# Patient Record
Sex: Male | Born: 1935
Health system: Southern US, Community
[De-identification: ages and names within clinical notes are randomized; demographics above are authoritative.]

## PROBLEM LIST (undated history)

## (undated) DIAGNOSIS — J069 Acute upper respiratory infection, unspecified: Secondary | ICD-10-CM

## (undated) DIAGNOSIS — K219 Gastro-esophageal reflux disease without esophagitis: Secondary | ICD-10-CM

## (undated) DIAGNOSIS — C801 Malignant (primary) neoplasm, unspecified: Secondary | ICD-10-CM

## (undated) DIAGNOSIS — J189 Pneumonia, unspecified organism: Secondary | ICD-10-CM

## (undated) DIAGNOSIS — R0602 Shortness of breath: Secondary | ICD-10-CM

## (undated) DIAGNOSIS — N182 Chronic kidney disease, stage 2 (mild): Secondary | ICD-10-CM

## (undated) DIAGNOSIS — I10 Essential (primary) hypertension: Secondary | ICD-10-CM

## (undated) DIAGNOSIS — Z8719 Personal history of other diseases of the digestive system: Secondary | ICD-10-CM

## (undated) DIAGNOSIS — J449 Chronic obstructive pulmonary disease, unspecified: Secondary | ICD-10-CM

## (undated) DIAGNOSIS — K469 Unspecified abdominal hernia without obstruction or gangrene: Secondary | ICD-10-CM

## (undated) DIAGNOSIS — I4891 Unspecified atrial fibrillation: Secondary | ICD-10-CM

## (undated) HISTORY — PX: COLON SURGERY: SHX602

---

## 1998-02-09 ENCOUNTER — Encounter: Payer: Self-pay | Admitting: Emergency Medicine

## 1998-02-09 ENCOUNTER — Encounter: Payer: Self-pay | Admitting: Cardiology

## 1998-02-10 ENCOUNTER — Inpatient Hospital Stay (HOSPITAL_COMMUNITY): Admission: EM | Admit: 1998-02-10 | Discharge: 1998-02-11 | Payer: Self-pay | Admitting: Emergency Medicine

## 1998-02-10 ENCOUNTER — Encounter: Payer: Self-pay | Admitting: Cardiology

## 1998-07-23 ENCOUNTER — Inpatient Hospital Stay (HOSPITAL_COMMUNITY): Admission: RE | Admit: 1998-07-23 | Discharge: 1998-07-30 | Payer: Self-pay | Admitting: Surgery

## 1998-07-23 ENCOUNTER — Encounter: Payer: Self-pay | Admitting: Surgery

## 1999-12-23 ENCOUNTER — Encounter: Admission: RE | Admit: 1999-12-23 | Discharge: 1999-12-23 | Payer: Self-pay | Admitting: *Deleted

## 2000-01-26 ENCOUNTER — Ambulatory Visit (HOSPITAL_COMMUNITY): Admission: RE | Admit: 2000-01-26 | Discharge: 2000-01-26 | Payer: Self-pay | Admitting: Internal Medicine

## 2000-01-28 ENCOUNTER — Ambulatory Visit (HOSPITAL_COMMUNITY): Admission: RE | Admit: 2000-01-28 | Discharge: 2000-01-28 | Payer: Self-pay | Admitting: *Deleted

## 2000-01-28 ENCOUNTER — Encounter (INDEPENDENT_AMBULATORY_CARE_PROVIDER_SITE_OTHER): Payer: Self-pay | Admitting: Specialist

## 2000-08-31 ENCOUNTER — Encounter (HOSPITAL_COMMUNITY): Admission: RE | Admit: 2000-08-31 | Discharge: 2000-11-29 | Payer: Self-pay | Admitting: Internal Medicine

## 2000-11-30 ENCOUNTER — Encounter (HOSPITAL_COMMUNITY): Admission: RE | Admit: 2000-11-30 | Discharge: 2001-02-28 | Payer: Self-pay | Admitting: Internal Medicine

## 2004-05-23 ENCOUNTER — Encounter: Payer: Self-pay | Admitting: Internal Medicine

## 2004-08-14 ENCOUNTER — Ambulatory Visit: Payer: Self-pay

## 2008-05-15 ENCOUNTER — Ambulatory Visit: Payer: Self-pay

## 2008-05-15 ENCOUNTER — Encounter (INDEPENDENT_AMBULATORY_CARE_PROVIDER_SITE_OTHER): Payer: Self-pay | Admitting: Internal Medicine

## 2008-05-18 ENCOUNTER — Emergency Department (HOSPITAL_COMMUNITY): Admission: EM | Admit: 2008-05-18 | Discharge: 2008-05-18 | Payer: Self-pay | Admitting: Emergency Medicine

## 2008-06-30 ENCOUNTER — Emergency Department (HOSPITAL_COMMUNITY): Admission: EM | Admit: 2008-06-30 | Discharge: 2008-06-30 | Payer: Self-pay | Admitting: Family Medicine

## 2008-11-22 ENCOUNTER — Encounter: Payer: Self-pay | Admitting: Internal Medicine

## 2009-01-28 ENCOUNTER — Encounter: Admission: RE | Admit: 2009-01-28 | Discharge: 2009-01-28 | Payer: Self-pay | Admitting: General Surgery

## 2009-01-28 ENCOUNTER — Encounter: Payer: Self-pay | Admitting: Internal Medicine

## 2009-02-13 ENCOUNTER — Ambulatory Visit: Payer: Self-pay | Admitting: Internal Medicine

## 2009-02-13 DIAGNOSIS — K469 Unspecified abdominal hernia without obstruction or gangrene: Secondary | ICD-10-CM | POA: Insufficient documentation

## 2009-02-13 DIAGNOSIS — I10 Essential (primary) hypertension: Secondary | ICD-10-CM

## 2009-02-13 DIAGNOSIS — E785 Hyperlipidemia, unspecified: Secondary | ICD-10-CM | POA: Insufficient documentation

## 2009-02-13 DIAGNOSIS — J449 Chronic obstructive pulmonary disease, unspecified: Secondary | ICD-10-CM | POA: Insufficient documentation

## 2009-03-15 ENCOUNTER — Ambulatory Visit: Payer: Self-pay | Admitting: Internal Medicine

## 2009-03-21 ENCOUNTER — Encounter: Payer: Self-pay | Admitting: Internal Medicine

## 2009-04-12 ENCOUNTER — Ambulatory Visit: Payer: Self-pay | Admitting: Internal Medicine

## 2009-06-11 ENCOUNTER — Emergency Department (HOSPITAL_COMMUNITY)
Admission: EM | Admit: 2009-06-11 | Discharge: 2009-06-12 | Payer: Self-pay | Source: Home / Self Care | Admitting: Emergency Medicine

## 2010-02-14 ENCOUNTER — Encounter: Payer: Self-pay | Admitting: Internal Medicine

## 2010-03-17 ENCOUNTER — Telehealth (INDEPENDENT_AMBULATORY_CARE_PROVIDER_SITE_OTHER): Payer: Self-pay | Admitting: *Deleted

## 2010-03-28 ENCOUNTER — Ambulatory Visit
Admission: RE | Admit: 2010-03-28 | Discharge: 2010-03-28 | Payer: Self-pay | Source: Home / Self Care | Attending: Internal Medicine | Admitting: Internal Medicine

## 2010-03-28 DIAGNOSIS — C61 Malignant neoplasm of prostate: Secondary | ICD-10-CM | POA: Insufficient documentation

## 2010-03-28 DIAGNOSIS — R05 Cough: Secondary | ICD-10-CM | POA: Insufficient documentation

## 2010-04-04 ENCOUNTER — Ambulatory Visit
Admission: RE | Admit: 2010-04-04 | Discharge: 2010-04-08 | Payer: Self-pay | Source: Home / Self Care | Attending: Radiation Oncology | Admitting: Radiation Oncology

## 2010-04-06 ENCOUNTER — Emergency Department (HOSPITAL_COMMUNITY)
Admission: EM | Admit: 2010-04-06 | Discharge: 2010-04-06 | Payer: Self-pay | Source: Home / Self Care | Admitting: Emergency Medicine

## 2010-04-06 LAB — DIFFERENTIAL
Basophils Relative: 0 % (ref 0–1)
Eosinophils Relative: 2 % (ref 0–5)
Lymphocytes Relative: 15 % (ref 12–46)
Monocytes Absolute: 1.4 10*3/uL — ABNORMAL HIGH (ref 0.1–1.0)
Monocytes Relative: 15 % — ABNORMAL HIGH (ref 3–12)
Neutro Abs: 6.1 10*3/uL (ref 1.7–7.7)

## 2010-04-06 LAB — BASIC METABOLIC PANEL
CO2: 26 mEq/L (ref 19–32)
Calcium: 9 mg/dL (ref 8.4–10.5)
Creatinine, Ser: 1.2 mg/dL (ref 0.4–1.5)
Glucose, Bld: 95 mg/dL (ref 70–99)

## 2010-04-06 LAB — CBC
HCT: 41.2 % (ref 39.0–52.0)
Hemoglobin: 13.9 g/dL (ref 13.0–17.0)
MCH: 31.3 pg (ref 26.0–34.0)
MCHC: 33.7 g/dL (ref 30.0–36.0)

## 2010-04-08 NOTE — Assessment & Plan Note (Signed)
Summary: Pulmonary/ summary final f/u ov with hfa teaching   Copy to:  Dr. Autumn Messing Primary Provider/Referring Provider:  Dr. Ashby Dawes  CC:  1 month followup.  Pt denies any complaints today.  He states that he thinks brovana has helped his breathing some.Marland Kitchen  History of Present Illness: 53 yowm quit smoking around 1970 due to Vocal cord problems per ENT but no residual symptoms  February 13, 2009 cc variable  sob x 10 years with PFT's at [facility] but doesn't remember when  gradually worse to point where needs when exert needs to go slow and wear 02.  Also cough more daytime clear mucus with occasional sensation of throat closing up, mucus getting stuck at level of suprasternal notch and choking him.  rec stop benazepril start benicar 40 mg one daily Stop fish oil until no longer coughing, then ok to take it again  March 15, 2009 1 month followup with PFT's.  Pt states that breathing is the same- no better or worse. cough is gone.  GOLD III rec start neb brovana /budoside   April 12, 2009 1 month followup.  Pt denies any complaints today.  He states that he thinks brovana /budesonide has helped his breathing some. Trouble paying for and naming his meds. Pt denies any increase in rescue therapy over baseline, denies waking up needing it or having early am exacerbations of coughing/wheezing/ or dyspnea   Current Medications (verified): 1)  Hydrochlorothiazide 12.5 Mg Caps (Hydrochlorothiazide) .Marland Kitchen.. 1 Once Daily 2)  Doxazosin Mesylate 2 Mg Tabs (Doxazosin Mesylate) .Marland Kitchen.. 1 Once Daily 3)  Lipitor 80 Mg Tabs (Atorvastatin Calcium) .... 1/2 Every Other Day 4)  Centrum Silver  Tabs (Multiple Vitamins-Minerals) .Marland Kitchen.. 1 Once Daily 5)  Aspirin 81 Mg Tbec (Aspirin) .Marland Kitchen.. 1 Once Daily 6)  Oscal 500/200 D-3 500-200 Mg-Unit Tabs (Calcium-Vitamin D) .Marland Kitchen.. 1 Once Daily 7)  Ventolin Hfa 108 (90 Base) Mcg/act Aers (Albuterol Sulfate) .... 2 Puffs Every 4-6 Hours As Needed 8)  Albuterol Sulfate (2.5 Mg/64ml)  0.083% Nebu (Albuterol Sulfate) .Marland Kitchen.. 1 in Nebulizer Three Times A Day 9)  Budesonide 0.5 Mg/75ml Susp (Budesonide) .Marland Kitchen.. 1 Vial in Nebulizer Two Times A Day 10)  Oxygen 2 Liters .... As Needed 11)  Benicar 40 Mg  Tabs (Olmesartan Medoxomil) .... One Tablet By Mouth Daily 12)  Brovana 15 Mcg/47ml  Nebu (Arformoterol Tartrate) .... One in Nebulizer Twice Daily  Allergies (verified): No Known Drug Allergies  Past History:  Past Medical History: C O P D.......................................................Marland KitchenWert    - PFT's March 15, 2009 FEV1 1.05 (44%) ratio 39 with 14% better after B2 and DLC048% Hyperlipidemia Hypertension  Vital Signs:  Patient profile:   77 year old male Weight:      182 pounds O2 Sat:      94 % on Room air Temp:     97.8 degrees F oral Pulse rate:   78 / minute BP sitting:   142 / 80  (left arm)  Vitals Entered By: Tilden Dome (April 12, 2009 11:48 AM)  O2 Flow:  Room air  Physical Exam  Additional Exam:  wt 174 February 13, 2009 > 177 March 15, 2009 > 182 April 12, 2009  In general robust pleasant amb wf with mild voice fatgue and throat clearing pseudowheeze resolves with purse lip maneuver  HEENT mild turbinate edema.  Oropharynx no thrush or excess pnd or cobblestoning.  No JVD or cervical adenopathy. Mild accessory muscle  hypertrophy. Trachea midline, nl thryroid. Chest was hyperinflated by percussion with diminished breath sounds and moderate increased exp time without wheeze. Hoover sign positive at mid inspiration. Regular rate and rhythm without murmur gallop or rub or increase P2 or edema.  Abd: no hsm, nl excursion. Ext warm without cyanosis or clubbing.     Impression & Recommendations:  Problem # 1:  C O P D (ICD-496)  PFT's March 15, 2009 FEV1 1.05 (44%) ratio 39 with 14% better after B2 and DLC048%  I spent extra time with the patient today explaining optimal mdi  technique.  This improved from  50-75% but he says he preferes neb  because he can get it thru part B medicare and can't afford to pay for it any other way. ok to add atrovent next to neb if needed which isn't the case now.   Each maintenance medication was reviewed in detail including most importantly the difference between maintenance and as needed and under what circumstances the prns are to be used. contingencies discussed  Problem # 2:  HYPERTENSION (ICD-401.9)  The following medications were removed from the medication list:    Benicar 40 Mg Tabs (Olmesartan medoxomil) ..... One tablet by mouth daily His updated medication list for this problem includes:    Hydrochlorothiazide 12.5 Mg Caps (Hydrochlorothiazide) .Marland Kitchen... 1 once daily    Doxazosin Mesylate 2 Mg Tabs (Doxazosin mesylate) .Marland Kitchen... 1 once daily    Hyzaar 100-12.5 Mg Tabs (Losartan potassium-hctz) ..... One daily  Prefer he avoid ace here and the only generic arb now is cozaar so see if he can get it through his generic plan and f/u per primary svc  ACE inhibitors are problematic in  pts with airway complaints because  even experienced pulmononlogists can't always distinguish ace effects from copd/asthma.  By themselves they don't actually cause a problem, much like oxygen can't by itself start a fire, but they certainly serve as a powerful catalyst or enhancer for any "fire"  or inflammatory process in the upper airway, be it caused by an ET  tube or more commonly reflux (especially in the obese or pts with known GERD or who are on biphoshonates). In the era of ARB near equivalency until we have a better handle on the reversibility of the airway problem, it just makes sense to avoid ace entirely in the short run and then decide later, having established a level of airway control using a reasonable limited regimen, whether to add back ace but even then being very careful to observe the pt for worsening airway control and number of meds used/ needed to control symptoms.  In his case I would avoid them  entirely.  Orders: Est. Patient Level IV [id number])  Medications Added to Medication List This Visit: 1)  Hyzaar 100-12.5 Mg Tabs (Losartan potassium-hctz) .... One daily  Patient Instructions: 1)  Hyzaar/hct 100 mg/  generic one daily in place of benicar and if you're not safisfied see Dr Bernie Covey 2)  if doing ok with breathing continue the Brovan/budesonide twice daily and use albuterol 2.5  as needed 3)  return in one year or let Dr Bernie Covey refill your nebulizer meds 4)  ADD 5)  Consider adding atrovent per neb if not satified with Brovana and Budesonide, the equivalent of Symbicort, per neb.  6)  OK FOR MINOR ABD SURGERIES LIKE HERNIA REPAIRS BUT WOULD NEED REPEAT PREOP CLEARANCE FOR ANYTHING ELSE Prescriptions: HYZAAR 100-12.5 MG TABS (LOSARTAN POTASSIUM-HCTZ) one daily  #30 x 11  Entered and Authorized by:   Tanda Rockers MD   Signed by:   Tanda Rockers MD on 04/12/2009   Method used:   Electronically to        [facility] (retail)       [address]       [place], [place]  [address]       Ph: [phone]       Fax: [phone]   RxID:   [id number]

## 2010-04-08 NOTE — Miscellaneous (Signed)
Summary: Orders Update pft charges  Clinical Lists Changes  Orders: Added new Service order of Carbon Monoxide diffusing w/capacity (94720) - Signed Added new Service order of Lung Volumes (94240) - Signed Added new Service order of Spirometry (Pre & Post) (94060) - Signed 

## 2010-04-08 NOTE — Assessment & Plan Note (Signed)
Summary: Pulmonary/ f/u ov with hfa teaching/ start brovana   Copy to:  Dr. Autumn Messing Primary Provider/Referring Provider:  Dr. Ashby Dawes  CC:  1 month followup with PFT's.  Pt states that breathing is the same- no better or worse.  Marland Kitchen  History of Present Illness: 24 yowm quit smoking around 1970 due to Vocal cord problems per ENT but no residual symptoms  February 13, 2009 cc variable  sob x 10 years with PFT's at [facility] but doesn't remember when  gradually worse to point where needs when exert needs to go slow and wear 02.  Also cough more daytime clear mucus with occasional sensation of throat closing up, mucus getting stuck at level of suprasternal notch and choking him.  rec stop benazepril start benicar 40 mg one daily Stop fish oil until no longer coughing, then ok to take it again  March 15, 2009 1 month followup with PFT's.  Pt states that breathing is the same- no better or worse. cough is gone.  Pt denies any significant sore throat, dysphagia, itching, sneezing,  nasal congestion or excess secretions,  fever, chills, sweats, unintended wt loss, pleuritic or exertional cp, hempoptysis, change in activity tolerance  orthopnea pnd or leg swelling .  Pt also denies any obvious fluctuation in symptoms with weather or environmental change or other alleviating or aggravating factors.        Current Medications (verified): 1)  Hydrochlorothiazide 12.5 Mg Caps (Hydrochlorothiazide) .Marland Kitchen.. 1 Once Daily 2)  Doxazosin Mesylate 2 Mg Tabs (Doxazosin Mesylate) .Marland Kitchen.. 1 Once Daily 3)  Lipitor 80 Mg Tabs (Atorvastatin Calcium) .... 1/2 Every Other Day 4)  Centrum Silver  Tabs (Multiple Vitamins-Minerals) .Marland Kitchen.. 1 Once Daily 5)  Aspirin 81 Mg Tbec (Aspirin) .Marland Kitchen.. 1 Once Daily 6)  Oscal 500/200 D-3 500-200 Mg-Unit Tabs (Calcium-Vitamin D) .Marland Kitchen.. 1 Once Daily 7)  Ventolin Hfa 108 (90 Base) Mcg/act Aers (Albuterol Sulfate) .... 2 Puffs Every 4-6 Hours As Needed 8)  Albuterol Sulfate (2.5 Mg/69ml) 0.083%  Nebu (Albuterol Sulfate) .Marland Kitchen.. 1 in Nebulizer Three Times A Day 9)  Budesonide 0.5 Mg/43ml Susp (Budesonide) .Marland Kitchen.. 1 Vial in Nebulizer Two Times A Day 10)  Oxygen 2 Liters .... As Needed 11)  Benicar 40 Mg  Tabs (Olmesartan Medoxomil) .... One Tablet By Mouth Daily  Allergies (verified): No Known Drug Allergies  Past History:  Past Medical History: C O P D.....................................................Marland KitchenWert    - PFT's March 15, 2009 FEV1 1.05 (44%) ratio 39 with 14% better after B2 and DLC048% Hyperlipidemia Hypertension  Vital Signs:  Patient profile:   77 year old male Weight:      177 pounds O2 Sat:      94 % on Room air Temp:     97.3 degrees F oral Pulse rate:   88 / minute BP sitting:   112 / 76  (left arm)  Vitals Entered By: Tilden Dome (March 15, 2009 11:46 AM)  O2 Flow:  Room air  Physical Exam  Additional Exam:  wt 174 February 13, 2009 > 177 March 15, 2009  In general robust pleasant amb wf with mild voice fatgue and throat clearing pseudowheeze resolves with purse lip maneuver  HEENT mild turbinate edema.  Oropharynx no thrush or excess pnd or cobblestoning.  No JVD or cervical adenopathy. Mild accessory muscle hypertrophy. Trachea midline, nl thryroid. Chest was hyperinflated by percussion with diminished breath sounds and moderate increased exp time without wheeze. Hoover sign positive at  mid inspiration. Regular rate and rhythm without murmur gallop or rub or increase P2 or edema.  Abd: no hsm, nl excursion. Ext warm without cyanosis or clubbing.     Impression & Recommendations:  Problem # 1:  C O P D (ICD-496) GOLD III with sign reversible component.  I spent extra time with the patient today explaining optimal mdi  technique.  This improved from  10-25 % but no better so until/unless able to master MDI more effectively option : change to Brovana and Budesonide, the equivalent of Symbicort, per neb.    Each maintenance medication was reviewed  in detail including most importantly the difference between maintenance and as needed and under what circumstances the prns are to be used. See instructions for specific recommendations   Medications Added to Medication List This Visit: 1)  Brovana 15 Mcg/23ml Nebu (Arformoterol tartrate) .... One in nebulizer twice daily  Other Orders: Est. Patient Level III [id number]) HFA Instruction [id number])  CHF Assessment/Plan:      The patient's current weight is 177 pounds.  His previous weight was 174 pounds.    Patient Instructions: 1)  GERD (REFLUX)  is a common cause of respiratory symptoms. It commonly presents without heartburn and can be treated with medication, but also with lifestyle changes including avoidance of late meals, excessive alcohol, smoking cessation, and avoid fatty foods, chocolate, peppermint, colas, red wine, and acidic juices such as orange juice. NO MINT OR MENTHOL PRODUCTS SO NO COUGH DROPS  2)  USE SUGARLESS CANDY INSTEAD (jolley ranchers)  3)  NO OIL BASED VITAMINS  4)  Start Brovan twice daily with budesonide in your nebulizer and only use albuterol in the puffer or nebulizer form as needed, not automatically 5)  Try symbicort 2 puffs first thing  in am and 2 puffs again in pm about 12 hours later to see if you learn the technique an helps your breathing  6)  Please schedule a follow-up appointment in 1 month. Prescriptions: BROVANA 15 MCG/2ML  NEBU (ARFORMOTEROL TARTRATE) One in nebulizer twice daily  #60 x 11   Entered and Authorized by:   Tanda Rockers MD   Signed by:   Tanda Rockers MD on 03/15/2009   Method used:   Print then Give to Patient   RxID:   [id number]

## 2010-04-08 NOTE — Medication Information (Signed)
Summary: ALB;BUDE;BROVANA/[facility]  ALB;BUDE;BROVANA/[facility]   Imported By: Bubba Hales 03/25/2009 09:53:31  _____________________________________________________________________  External Attachment:    Type:   Image     Comment:   External Document

## 2010-04-09 ENCOUNTER — Ambulatory Visit: Payer: PRIVATE HEALTH INSURANCE | Admitting: Radiation Oncology

## 2010-04-10 NOTE — Assessment & Plan Note (Signed)
Summary: Pulmonary/ ext ov for copd   rx for cough   Copy to:  Dr. Autumn Messing Primary Provider/Referring Provider:  Dr. Ashby Dawes  CC:  Cough- worse.  History of Present Illness: 40  yowm quit smoking around 1970 due to Vocal cord problems per ENT but no residual symptoms  February 13, 2009 cc variable  sob x 10 years with PFT's at [facility] but doesn't remember when  gradually worse to point where needs when exert needs to go slow and wear 02.  Also cough more daytime clear mucus with occasional sensation of throat closing up, mucus getting stuck at level of suprasternal notch and choking him.  rec stop benazepril start benicar 40 mg one daily Stop fish oil until no longer coughing, then ok to take it again  March 15, 2009 1 month followup with PFT's.  Pt states that breathing is the same- no better or worse. cough is gone.  GOLD III rec start neb brovana /budoside   April 12, 2009 1 month followup. Hyzaar/hct 100 mg/  generic one daily in place of benicar and if you're not safisfied see Dr Bernie Covey if doing ok with breathing continue the Brovana/budesonide twice daily and use albuterol 2.5  as needed return in one year or let Dr Bernie Covey refill your nebulizer meds ADD Consider adding atrovent per neb if not satified with Brovana and Budesonide combination  02/2010 Dx of prostate Ca ? needs surgery.  March 28, 2010 ov f/u copd GOLD III with doe walks flat ok including mall as long as takes time. main concern is cough more day than night more dry than wet. seems to choke sometimes drinking water. Pt denies any significant sore throat, dysphagia, itching, sneezing,  nasal congestion or excess secretions,  fever, chills, sweats, unintended wt loss, pleuritic or exertional cp, hempoptysis, change in activity tolerance  orthopnea pnd or leg swelling. Pt also denies any obvious fluctuation in symptoms with weather or environmental change or other alleviating or aggravating factors.        Current Medications (verified): 1)  Hydrochlorothiazide 12.5 Mg Caps (Hydrochlorothiazide) .Marland Kitchen.. 1 Once Daily 2)  Doxazosin Mesylate 2 Mg Tabs (Doxazosin Mesylate) .Marland Kitchen.. 1 Once Daily 3)  Lipitor 80 Mg Tabs (Atorvastatin Calcium) .... 1/2 Every Other Day 4)  Centrum Silver  Tabs (Multiple Vitamins-Minerals) .Marland Kitchen.. 1 Once Daily 5)  Aspirin 81 Mg Tbec (Aspirin) .Marland Kitchen.. 1 Once Daily 6)  Oscal 500/200 D-3 500-200 Mg-Unit Tabs (Calcium-Vitamin D) .Marland Kitchen.. 1 Once Daily 7)  Ventolin Hfa 108 (90 Base) Mcg/act Aers (Albuterol Sulfate) .... 2 Puffs Every 4-6 Hours As Needed 8)  Albuterol Sulfate (2.5 Mg/77ml) 0.083% Nebu (Albuterol Sulfate) .Marland Kitchen.. 1 in Nebulizer Three Times A Day As Needed 9)  Budesonide 0.5 Mg/71ml Susp (Budesonide) .Marland Kitchen.. 1 Vial in Nebulizer Two Times A Day 10)  Oxygen 2 Liters .... As Needed 11)  Brovana 15 Mcg/35ml  Nebu (Arformoterol Tartrate) .... One in Nebulizer Twice Daily 12)  Hyzaar 100-12.5 Mg Tabs (Losartan Potassium-Hctz) .... One Daily  Allergies (verified): No Known Drug Allergies  Past History:  Past Medical History: C O P D.........................................................Marland KitchenWert    - PFT's March 15, 2009 FEV1 1.05 (44%) ratio 39 with 14% better after B2 and DLC048% Hyperlipidemia Hypertension  Vital Signs:  Patient profile:   77 year old male Weight:      177 pounds BMI:     27.01 O2 Sat:      93 % on Room air Temp:  97.8 degrees F oral Pulse rate:   87 / minute BP sitting:   138 / 62  (left arm)  Vitals Entered By: Tilden Dome (March 28, 2010 12:25 PM)  O2 Flow:  Room air  Physical Exam  Additional Exam:  wt 174 February 13, 2009 > 177 March 15, 2009 > 182 April 12, 2009 > 177 March 30, 2010  In general robust pleasant amb wf with mild voice fatgue and throat clearing pseudowheeze resolves with purse lip maneuver  HEENT mild turbinate edema.  Oropharynx no thrush or excess pnd or cobblestoning.  No JVD or cervical adenopathy. Mild  accessory muscle hypertrophy. Trachea midline, nl thryroid. Chest was hyperinflated by percussion with diminished breath sounds and moderate increased exp time without wheeze. Hoover sign positive at mid inspiration. Regular rate and rhythm without murmur gallop or rub or increase P2 or edema.  Abd: no hsm, nl excursion. Ext warm without cyanosis or clubbing.     Impression & Recommendations:  Problem # 1:  C O P D (ICD-496)   PFT's March 15, 2009 FEV1 1.05 (44%) ratio 39 with 14% better after B2 and DLC048% c/w GOLD III COPD  He is relatively well compensated at present but a poor candidate for invasive procedures which require general anesthesia,  not necessarily prohibitive if the risk is worth the benefit.   In the case of prostate ca at age 66 I  will defer to Dr Terance Hart re best option but strongly favor conservative rx.  Problem # 2:  COUGH (ICD-786.2) The most common causes of chronic cough in immunocompetent adults include: upper airway cough syndrome (UACS), previously referred to as postnasal drip syndrome,  caused by variety of rhinosinus conditions; (2) asthma; (3) GERD; (4) chronic bronchitis from cigarette smoking or other inhaled environmental irritants; (5) nonasthmatic eosinophilic bronchitis; and (6) bronchiectasis. These conditions, singly or in combination, have accounted for up to 94% of the causes of chronic cough in prospective studies.   Stongly doubt copd is related to cough. . this is most c/w  Classic Upper airway cough syndrome, so named because it's frequently impossible to sort out how much is  CR/sinusitis with freq throat clearing (which can be related to primary GERD)   vs  causing  secondary extra esophageal GERD from wide swings in gastric pressure that occur with throat clearing, promoting self use of mint and menthol lozenges that reduce the lower esophageal sphincter tone and exacerbate the problem further These are the same pts who not infrequently have failed  to tolerate ace inhibitors,  dry powder inhalers or biphosphonates or report having reflux symptoms that don't respond to standard doses of PPI  See instructions for specific recommendations   Medications Added to Medication List This Visit: 1)  Prednisone 10 Mg Tabs (Prednisone) .... 4 each am x 2days, 2x2days, 1x2days and stop 2)  Prilosec Otc 20 Mg Tbec (Omeprazole magnesium) .... Take  one 30-60 min before first meal of the day 3)  Pepcid 20 Mg Tabs (Famotidine) .... Take one by mouth at bedtime  Other Orders: Prescription Created Electronically [phone]) Est. Patient Level IV [id number])  Patient Instructions: 1)  Prilosec before bfast and pepcid 20 mg at bedtime as long as coughing ( reflux is to cough what oxygen is to fire)  2)  Prednisone 10 mg 4 each am x 2days, 2x2days, 1x2days and stop 3)  GERD (REFLUX)  is a common cause of respiratory symptoms. It commonly presents without heartburn  and can be treated with medication, but also with lifestyle changes including avoidance of late meals, excessive alcohol, smoking cessation, and avoid fatty foods, chocolate, peppermint, colas, red wine, and acidic juices such as orange juice. NO MINT OR MENTHOL PRODUCTS SO NO COUGH DROPS  4)  USE SUGARLESS CANDY INSTEAD (jolley ranchers at [employer])  5)  NO OIL BASED VITAMINS  6)  Please schedule a follow-up appointment in 2 weeks, sooner if needed  Prescriptions: PREDNISONE 10 MG  TABS (PREDNISONE) 4 each am x 2days, 2x2days, 1x2days and stop  #14 x 0   Entered and Authorized by:   Tanda Rockers MD   Signed by:   Tanda Rockers MD on 03/28/2010   Method used:   Electronically to        [facility] (retail)       [address]       [place], [place]  [address]       Ph: [phone]       Fax: [phone]   RxID:   [id number]

## 2010-04-10 NOTE — Letter (Signed)
Summary: [facility]  [facility]   Imported By: Bubba Hales 02/24/2010 09:07:40  _____________________________________________________________________  External Attachment:    Type:   Image     Comment:   External Document

## 2010-04-10 NOTE — Progress Notes (Signed)
Summary: Dr. Terance Hart needs to discuss pt with MW  Phone Note From Other Clinic Call back at [phone]   Caller: Dr. Ruben [facility] Call For: Dr. [name] Summary of Call: Dr. Terance Hart states he asked Dr. [name] about the pt and also faxed over his last OV note. Dr. Terance Hart states he has personally asked MW about this pt and his pulmonary diagnosis. Dr. [name] was supposed to review this OV note and then call Dr. Terance Hart to discuss the pt.  I advised I will forward message to MW to remind him to do this. Ashley Bing CMA  March 17, 2010 11:45 AM   Follow-up for Phone Call        He's right - get pt back to office next avail appt and let Dr Mamie Nick know  I do remember the patient/ problem but it didn't get entered into the computer because of the holiday schedule but we'll get to him right away Follow-up by: Tanda Rockers MD,  March 17, 2010 2:11 PM  Additional Follow-up for Phone Call Additional follow up Details #1::        Spoke with pt and sched appt with MW for 03/28/09 at noon, [facility] for Dr Elam Dutch nurse Collie Siad to be made aware of appt date/time. Additional Follow-up by: Tilden Dome,  March 17, 2010 2:19 PM

## 2010-04-15 ENCOUNTER — Ambulatory Visit: Payer: Medicare Other | Admitting: Internal Medicine

## 2010-04-25 ENCOUNTER — Ambulatory Visit: Payer: Medicare Other | Admitting: Radiation Oncology

## 2010-05-28 LAB — POCT I-STAT, CHEM 8
Calcium, Ion: 1.16 mmol/L (ref 1.12–1.32)
Creatinine, Ser: 0.9 mg/dL (ref 0.4–1.5)
Glucose, Bld: 124 mg/dL — ABNORMAL HIGH (ref 70–99)
Hemoglobin: 14.3 g/dL (ref 13.0–17.0)
TCO2: 26 mmol/L (ref 0–100)

## 2010-06-11 ENCOUNTER — Ambulatory Visit: Payer: Medicare Other | Attending: Radiation Oncology | Admitting: Radiation Oncology

## 2010-06-11 DIAGNOSIS — Z510 Encounter for antineoplastic radiation therapy: Secondary | ICD-10-CM | POA: Insufficient documentation

## 2010-06-11 DIAGNOSIS — C61 Malignant neoplasm of prostate: Secondary | ICD-10-CM | POA: Insufficient documentation

## 2010-07-25 NOTE — Procedures (Signed)
[facility]  Patient:    Trevor Pitts, Trevor Pitts                     MRN: [id number] Proc. Date: 01/28/00 Adm. Date:  [id number] Disc. Date: [id number] Attending:  Jim Desanctis                           Procedure Report  PROCEDURE PERFORMED:  Upper endoscopy with biopsy.  INDICATIONS:  Abdominal discomfort, bloating, hard to swallow, and dysphagia.  ANESTHESIA:  Demerol 50 mg and Versed 5 mg were given intravenously.  PROCEDURE:  With the patient mildly sedated, in the left lateral decubitus position, the Olympus videoscopic endoscope was inserted and was passed under direct vision through the esophagus, which appeared normal except for the distal most esophagus where there was a stricture and ulceration seen and photographed, and we biopsied this area.  With biopsies, we took bites of the strictured area.  There in the stomach through the hiatal hernia sac, the fundus, body, and antrum appeared relatively normal and were photographed; however, the duodenal bulb showed changes of duodenitis, which was photographed.  The endoscope was then advanced to the second portion of the duodenum, which appeared normal.  From this point, the endoscope was slowly withdrawn, taken circumferential views of the entire duodenal mucosa.  The endoscope was then pulled back from the stomach, at which point, we did a CLO biopsy.  The scope was then placed in retroflexion, viewing the stomach from below.  The endoscope was then straightened and withdrawn, taking circumferential views in the remaining gastric and esophageal mucosa, which otherwise appeared normal.  The patients vital signs and pulse oximeter remained stable.  The patient tolerated the procedure well without apparent complications.  FINDINGS: 1. Changes of esophagitis with stricture, biopsied.  Await biopsy report and    clinical response. 2. The patient will call me for results and follow up with me as an  outpatient. DD:  01/28/00 TD:  01/28/00 Job: [id number]

## 2010-08-18 ENCOUNTER — Other Ambulatory Visit: Payer: Self-pay | Admitting: Radiation Oncology

## 2010-08-18 LAB — URINALYSIS, MICROSCOPIC - CHCC
Leukocyte Esterase: NEGATIVE
Protein: NEGATIVE mg/dL
pH: 6 (ref 4.6–8.0)

## 2010-09-09 ENCOUNTER — Ambulatory Visit
Admission: RE | Admit: 2010-09-09 | Discharge: 2010-09-09 | Disposition: A | Discharge status: Home / Self Care | Payer: Medicare Other | Source: Ambulatory Visit | Attending: Radiation Oncology | Admitting: Radiation Oncology

## 2010-09-09 DIAGNOSIS — Z510 Encounter for antineoplastic radiation therapy: Secondary | ICD-10-CM | POA: Insufficient documentation

## 2010-09-09 DIAGNOSIS — C61 Malignant neoplasm of prostate: Secondary | ICD-10-CM | POA: Insufficient documentation

## 2010-09-09 DIAGNOSIS — R300 Dysuria: Secondary | ICD-10-CM | POA: Insufficient documentation

## 2010-10-16 ENCOUNTER — Ambulatory Visit
Admission: RE | Admit: 2010-10-16 | Discharge: 2010-10-16 | Disposition: A | Discharge status: Home / Self Care | Payer: Medicare Other | Source: Ambulatory Visit | Attending: Radiation Oncology | Admitting: Radiation Oncology

## 2010-10-16 DIAGNOSIS — C61 Malignant neoplasm of prostate: Secondary | ICD-10-CM | POA: Insufficient documentation

## 2010-10-16 LAB — URINALYSIS, MICROSCOPIC - CHCC
Glucose: NEGATIVE g/dL
Nitrite: NEGATIVE
Protein: <30 mg/dL
Specific Gravity, Urine: 1.02 (ref 1.003–1.035)

## 2010-10-18 LAB — URINE CULTURE

## 2011-03-22 ENCOUNTER — Other Ambulatory Visit: Payer: Self-pay

## 2011-03-22 ENCOUNTER — Emergency Department (HOSPITAL_COMMUNITY): Payer: Medicare Other

## 2011-03-22 ENCOUNTER — Inpatient Hospital Stay (HOSPITAL_COMMUNITY)
Admission: EM | Admit: 2011-03-22 | Discharge: 2011-03-25 | DRG: 194 | Disposition: A | Discharge status: Home Health | Payer: Medicare Other | Attending: Internal Medicine | Admitting: Internal Medicine

## 2011-03-22 ENCOUNTER — Encounter (HOSPITAL_COMMUNITY): Payer: Self-pay | Admitting: *Deleted

## 2011-03-22 DIAGNOSIS — R059 Cough, unspecified: Secondary | ICD-10-CM

## 2011-03-22 DIAGNOSIS — Z79899 Other long term (current) drug therapy: Secondary | ICD-10-CM | POA: Diagnosis not present

## 2011-03-22 DIAGNOSIS — Z859 Personal history of malignant neoplasm, unspecified: Secondary | ICD-10-CM | POA: Diagnosis not present

## 2011-03-22 DIAGNOSIS — R918 Other nonspecific abnormal finding of lung field: Secondary | ICD-10-CM | POA: Diagnosis not present

## 2011-03-22 DIAGNOSIS — Z7982 Long term (current) use of aspirin: Secondary | ICD-10-CM | POA: Diagnosis not present

## 2011-03-22 DIAGNOSIS — K219 Gastro-esophageal reflux disease without esophagitis: Secondary | ICD-10-CM | POA: Diagnosis present

## 2011-03-22 DIAGNOSIS — I10 Essential (primary) hypertension: Secondary | ICD-10-CM | POA: Diagnosis present

## 2011-03-22 DIAGNOSIS — J189 Pneumonia, unspecified organism: Secondary | ICD-10-CM | POA: Diagnosis not present

## 2011-03-22 DIAGNOSIS — J45901 Unspecified asthma with (acute) exacerbation: Secondary | ICD-10-CM | POA: Diagnosis not present

## 2011-03-22 DIAGNOSIS — R05 Cough: Secondary | ICD-10-CM

## 2011-03-22 DIAGNOSIS — J4489 Other specified chronic obstructive pulmonary disease: Secondary | ICD-10-CM

## 2011-03-22 DIAGNOSIS — J449 Chronic obstructive pulmonary disease, unspecified: Secondary | ICD-10-CM

## 2011-03-22 DIAGNOSIS — D696 Thrombocytopenia, unspecified: Secondary | ICD-10-CM | POA: Diagnosis not present

## 2011-03-22 DIAGNOSIS — R1310 Dysphagia, unspecified: Secondary | ICD-10-CM | POA: Diagnosis present

## 2011-03-22 DIAGNOSIS — R0989 Other specified symptoms and signs involving the circulatory and respiratory systems: Secondary | ICD-10-CM | POA: Diagnosis present

## 2011-03-22 DIAGNOSIS — J441 Chronic obstructive pulmonary disease with (acute) exacerbation: Secondary | ICD-10-CM | POA: Diagnosis present

## 2011-03-22 DIAGNOSIS — R0609 Other forms of dyspnea: Secondary | ICD-10-CM | POA: Diagnosis present

## 2011-03-22 DIAGNOSIS — E785 Hyperlipidemia, unspecified: Secondary | ICD-10-CM | POA: Diagnosis not present

## 2011-03-22 DIAGNOSIS — Z882 Allergy status to sulfonamides status: Secondary | ICD-10-CM

## 2011-03-22 DIAGNOSIS — K469 Unspecified abdominal hernia without obstruction or gangrene: Secondary | ICD-10-CM

## 2011-03-22 DIAGNOSIS — C61 Malignant neoplasm of prostate: Secondary | ICD-10-CM

## 2011-03-22 DIAGNOSIS — R0602 Shortness of breath: Secondary | ICD-10-CM | POA: Diagnosis not present

## 2011-03-22 HISTORY — DX: Acute upper respiratory infection, unspecified: J06.9

## 2011-03-22 HISTORY — DX: Shortness of breath: R06.02

## 2011-03-22 HISTORY — DX: Personal history of other diseases of the digestive system: Z87.19

## 2011-03-22 HISTORY — DX: Chronic obstructive pulmonary disease, unspecified: J44.9

## 2011-03-22 HISTORY — DX: Pneumonia, unspecified organism: J18.9

## 2011-03-22 HISTORY — DX: Malignant (primary) neoplasm, unspecified: C80.1

## 2011-03-22 HISTORY — DX: Essential (primary) hypertension: I10

## 2011-03-22 LAB — CBC
HCT: 37.9 % — ABNORMAL LOW (ref 39.0–52.0)
Hemoglobin: 12.5 g/dL — ABNORMAL LOW (ref 13.0–17.0)
MCHC: 34.3 g/dL (ref 30.0–36.0)
MCHC: 34.2 g/dL (ref 30.0–36.0)
Platelets: 214 10*3/uL (ref 150–400)
RDW: 13.6 % (ref 11.5–15.5)
RDW: 13.6 % (ref 11.5–15.5)

## 2011-03-22 LAB — BASIC METABOLIC PANEL
BUN: 28 mg/dL — ABNORMAL HIGH (ref 6–23)
GFR calc Af Amer: 53 mL/min — ABNORMAL LOW (ref >90)
GFR calc non Af Amer: 46 mL/min — ABNORMAL LOW (ref >90)
Potassium: 4 mEq/L (ref 3.5–5.1)

## 2011-03-22 LAB — CREATININE, SERUM
Creatinine, Ser: 1.49 mg/dL — ABNORMAL HIGH (ref 0.50–1.35)
GFR calc non Af Amer: 41 mL/min — ABNORMAL LOW (ref >90)

## 2011-03-22 MED ORDER — METHYLPREDNISOLONE SODIUM SUCC 40 MG IJ SOLR
40.0000 mg | Freq: Two times a day (BID) | INTRAMUSCULAR | Status: AC
  Administered 2011-03-22 – 2011-03-24 (×5): 40 mg via INTRAVENOUS
  Filled 2011-03-22 (×7): qty 1

## 2011-03-22 MED ORDER — HYDROCHLOROTHIAZIDE 25 MG PO TABS
25.0000 mg | ORAL_TABLET | Freq: Every day | ORAL | Status: DC
  Administered 2011-03-23 – 2011-03-25 (×3): 25 mg via ORAL
  Filled 2011-03-22 (×4): qty 1

## 2011-03-22 MED ORDER — SIMVASTATIN 20 MG PO TABS
20.0000 mg | ORAL_TABLET | Freq: Every day | ORAL | Status: DC
  Administered 2011-03-22 – 2011-03-23 (×2): 20 mg via ORAL
  Filled 2011-03-22 (×4): qty 1

## 2011-03-22 MED ORDER — CALCIUM CARBONATE-VITAMIN D 500-200 MG-UNIT PO TABS
1.0000 | ORAL_TABLET | Freq: Every day | ORAL | Status: DC
  Administered 2011-03-22 – 2011-03-25 (×4): 1 via ORAL
  Filled 2011-03-22 (×4): qty 1

## 2011-03-22 MED ORDER — GUAIFENESIN-DM 100-10 MG/5ML PO SYRP
5.0000 mL | ORAL_SOLUTION | ORAL | Status: DC | PRN
  Administered 2011-03-22: 5 mL via ORAL
  Filled 2011-03-22: qty 5

## 2011-03-22 MED ORDER — ALBUTEROL SULFATE HFA 108 (90 BASE) MCG/ACT IN AERS
2.0000 | INHALATION_SPRAY | Freq: Four times a day (QID) | RESPIRATORY_TRACT | Status: DC | PRN
  Filled 2011-03-22: qty 6.7

## 2011-03-22 MED ORDER — DOCUSATE SODIUM 100 MG PO CAPS
100.0000 mg | ORAL_CAPSULE | Freq: Two times a day (BID) | ORAL | Status: DC
  Administered 2011-03-24: 100 mg via ORAL
  Filled 2011-03-22 (×8): qty 1

## 2011-03-22 MED ORDER — ACETAMINOPHEN 325 MG PO TABS: 650.0000 mg | ORAL_TABLET | Freq: Four times a day (QID) | ORAL | Status: DC | PRN

## 2011-03-22 MED ORDER — IPRATROPIUM BROMIDE 0.02 % IN SOLN
0.5000 mg | Freq: Once | RESPIRATORY_TRACT | Status: AC
  Administered 2011-03-22: 0.5 mg via RESPIRATORY_TRACT
  Filled 2011-03-22: qty 2.5

## 2011-03-22 MED ORDER — HYDROCOD POLST-CHLORPHEN POLST 10-8 MG/5ML PO LQCR
5.0000 mL | Freq: Once | ORAL | Status: AC
  Administered 2011-03-22: 5 mL via ORAL
  Filled 2011-03-22: qty 5

## 2011-03-22 MED ORDER — ACETAMINOPHEN 650 MG RE SUPP: 650.0000 mg | Freq: Four times a day (QID) | RECTAL | Status: DC | PRN

## 2011-03-22 MED ORDER — DEXTROSE 5 % IV SOLN
500.0000 mg | INTRAVENOUS | Status: DC
  Administered 2011-03-22 – 2011-03-23 (×2): 500 mg via INTRAVENOUS
  Filled 2011-03-22 (×3): qty 500

## 2011-03-22 MED ORDER — DEXTROSE 5 % IV SOLN
1.0000 g | INTRAVENOUS | Status: DC
  Administered 2011-03-22 – 2011-03-24 (×3): 1 g via INTRAVENOUS
  Filled 2011-03-22 (×3): qty 10

## 2011-03-22 MED ORDER — BUDESONIDE 0.5 MG/2ML IN SUSP
0.5000 mg | Freq: Two times a day (BID) | RESPIRATORY_TRACT | Status: DC
  Administered 2011-03-22 – 2011-03-25 (×6): 0.5 mg via RESPIRATORY_TRACT
  Filled 2011-03-22 (×8): qty 2

## 2011-03-22 MED ORDER — SODIUM CHLORIDE 0.9 % IJ SOLN
3.0000 mL | Freq: Two times a day (BID) | INTRAMUSCULAR | Status: DC
  Administered 2011-03-22 – 2011-03-25 (×7): 3 mL via INTRAVENOUS

## 2011-03-22 MED ORDER — DOXAZOSIN MESYLATE 2 MG PO TABS
2.0000 mg | ORAL_TABLET | Freq: Every day | ORAL | Status: DC
  Administered 2011-03-22 – 2011-03-25 (×4): 2 mg via ORAL
  Filled 2011-03-22 (×4): qty 1

## 2011-03-22 MED ORDER — ENOXAPARIN SODIUM 40 MG/0.4ML ~~LOC~~ SOLN
40.0000 mg | SUBCUTANEOUS | Status: DC
  Administered 2011-03-22 – 2011-03-25 (×4): 40 mg via SUBCUTANEOUS
  Filled 2011-03-22 (×4): qty 0.4

## 2011-03-22 MED ORDER — ONDANSETRON HCL 4 MG/2ML IJ SOLN: 4.0000 mg | Freq: Four times a day (QID) | INTRAMUSCULAR | Status: DC | PRN

## 2011-03-22 MED ORDER — ASPIRIN EC 81 MG PO TBEC
81.0000 mg | DELAYED_RELEASE_TABLET | Freq: Every day | ORAL | Status: DC
  Administered 2011-03-22 – 2011-03-25 (×4): 81 mg via ORAL
  Filled 2011-03-22 (×4): qty 1

## 2011-03-22 MED ORDER — ONDANSETRON HCL 4 MG PO TABS: 4.0000 mg | ORAL_TABLET | Freq: Four times a day (QID) | ORAL | Status: DC | PRN

## 2011-03-22 MED ORDER — ALBUTEROL SULFATE (5 MG/ML) 0.5% IN NEBU
5.0000 mg | INHALATION_SOLUTION | Freq: Once | RESPIRATORY_TRACT | Status: AC
  Administered 2011-03-22: 5 mg via RESPIRATORY_TRACT
  Filled 2011-03-22: qty 1

## 2011-03-22 MED ORDER — ALBUTEROL SULFATE (5 MG/ML) 0.5% IN NEBU
2.5000 mg | INHALATION_SOLUTION | RESPIRATORY_TRACT | Status: DC | PRN
  Administered 2011-03-22 – 2011-03-24 (×5): 2.5 mg via RESPIRATORY_TRACT
  Filled 2011-03-22 (×5): qty 0.5

## 2011-03-22 MED ORDER — SODIUM CHLORIDE 0.9 % IV SOLN: 250.0000 mL | INTRAVENOUS | Status: DC | PRN

## 2011-03-22 MED ORDER — ARFORMOTEROL TARTRATE 15 MCG/2ML IN NEBU
15.0000 ug | INHALATION_SOLUTION | Freq: Two times a day (BID) | RESPIRATORY_TRACT | Status: DC
  Administered 2011-03-23 – 2011-03-25 (×4): 15 ug via RESPIRATORY_TRACT
  Filled 2011-03-22 (×8): qty 2

## 2011-03-22 MED ORDER — SODIUM CHLORIDE 0.9 % IJ SOLN: 3.0000 mL | INTRAMUSCULAR | Status: DC | PRN

## 2011-03-22 MED ORDER — PANTOPRAZOLE SODIUM 40 MG PO TBEC
40.0000 mg | DELAYED_RELEASE_TABLET | Freq: Every day | ORAL | Status: DC
  Administered 2011-03-22 – 2011-03-25 (×4): 40 mg via ORAL
  Filled 2011-03-22 (×4): qty 1

## 2011-03-22 MED ORDER — FAMOTIDINE 20 MG PO TABS
20.0000 mg | ORAL_TABLET | Freq: Every day | ORAL | Status: DC
  Administered 2011-03-22 – 2011-03-24 (×3): 20 mg via ORAL
  Filled 2011-03-22 (×4): qty 1

## 2011-03-22 MED ORDER — ALUM & MAG HYDROXIDE-SIMETH 200-200-20 MG/5ML PO SUSP: 30.0000 mL | Freq: Four times a day (QID) | ORAL | Status: DC | PRN

## 2011-03-22 MED ORDER — HYDROCHLOROTHIAZIDE 12.5 MG PO CAPS: 12.5000 mg | ORAL_CAPSULE | Freq: Every day | ORAL | Status: DC

## 2011-03-22 MED ORDER — LOSARTAN POTASSIUM 50 MG PO TABS
100.0000 mg | ORAL_TABLET | Freq: Every day | ORAL | Status: DC
  Administered 2011-03-23 – 2011-03-25 (×3): 100 mg via ORAL
  Filled 2011-03-22 (×4): qty 2

## 2011-03-22 MED ORDER — PREDNISONE 20 MG PO TABS
60.0000 mg | ORAL_TABLET | Freq: Once | ORAL | Status: AC
  Administered 2011-03-22: 60 mg via ORAL
  Filled 2011-03-22: qty 3

## 2011-03-22 MED ORDER — LOSARTAN POTASSIUM-HCTZ 100-12.5 MG PO TABS: 1.0000 | ORAL_TABLET | Freq: Every day | ORAL | Status: DC

## 2011-03-22 MED ORDER — THERA M PLUS PO TABS
1.0000 | ORAL_TABLET | Freq: Every day | ORAL | Status: DC
  Administered 2011-03-22 – 2011-03-25 (×4): 1 via ORAL
  Filled 2011-03-22 (×4): qty 1

## 2011-03-22 NOTE — H&P (Signed)
PCP:   RAMACHANDRAN,AJITH, MD, MD   Chief Complaint:  dyspnea  HPI: 78 yo man with known COPD presents with non productive cough, wheezing and increase dyspnea for the past 3 days. He also has developed a right side chest pain. This AM his breathing got really tight and he called his son to give him a ride to the ED.   Review of Systems:  The patient denies anorexia, fever, weight loss,, vision loss, decreased hearing, hoarseness, syncope, peripheral edema, balance deficits, hemoptysis, abdominal pain, melena, hematochezia, severe indigestion/heartburn, hematuria, incontinence, genital sores, muscle weakness, suspicious skin lesions, transient blindness, difficulty walking, depression, unusual weight change, abnormal bleeding, enlarged lymph nodes, angioedema,  Past Medical History: Past Medical History  Diagnosis Date  . COPD (chronic obstructive pulmonary disease)   . Asthma   . Shortness of breath   . Cancer   . Hypertension   . Recurrent upper respiratory infection (URI)   . Pneumonia   . H/O hiatal hernia    Past Surgical History  Procedure Date  . Colon surgery     Medications: Prior to Admission medications   Medication Sig Start Date End Date Taking? Authorizing Provider  albuterol (PROVENTIL HFA;VENTOLIN HFA) 108 (90 BASE) MCG/ACT inhaler Inhale 2 puffs into the lungs every 6 (six) hours as needed. For shortness of breath   Yes Historical Provider, MD  arformoterol (BROVANA) 15 MCG/2ML NEBU Take 15 mcg by nebulization 2 (two) times daily.   Yes Historical Provider, MD  aspirin EC 81 MG tablet Take 81 mg by mouth daily.   Yes Historical Provider, MD  atorvastatin (LIPITOR) 80 MG tablet Take 40 mg by mouth every other day.   Yes Historical Provider, MD  budesonide (PULMICORT) 0.5 MG/2ML nebulizer solution Take 0.5 mg by nebulization 2 (two) times daily.   Yes Historical Provider, MD  calcium-vitamin D (OSCAL WITH D) 500-200 MG-UNIT per tablet Take 1 tablet by mouth daily.    Yes Historical Provider, MD  doxazosin (CARDURA) 2 MG tablet Take 2 mg by mouth daily.   Yes Historical Provider, MD  famotidine (PEPCID) 20 MG tablet Take 20 mg by mouth at bedtime.   Yes Historical Provider, MD  hydrochlorothiazide (MICROZIDE) 12.5 MG capsule Take 12.5 mg by mouth daily.   Yes Historical Provider, MD  losartan-hydrochlorothiazide (HYZAAR) 100-12.5 MG per tablet Take 1 tablet by mouth daily.   Yes Historical Provider, MD  Multiple Vitamins-Minerals (MULTIVITAMINS THER. W/MINERALS) TABS Take 1 tablet by mouth daily.   Yes Historical Provider, MD  omeprazole (PRILOSEC) 20 MG capsule Take 20 mg by mouth daily. 30-60 min before first meal   Yes Historical Provider, MD    Allergies:   Allergies  Allergen Reactions  . Sulfa Antibiotics     Social History:  reports that he has quit smoking. He quit smokeless tobacco use about 40 years ago. He reports that he does not drink alcohol. His drug history not on file.  History   Social History Narrative   Lives with wife     Family History: History reviewed. No pertinent family history.  Physical Exam: Filed Vitals:   03/22/11 0940 03/22/11 1043 03/22/11 1044 03/22/11 1410  BP: 100/55 116/54  101/55  Pulse: 118 117  87  Temp:  98.1 F (36.7 C)  98.3 F (36.8 C)  TempSrc:  Oral    Resp: 18 20  20   Height:   5\' 8"  (1.727 m)   SpO2: 94% 90% 95% 93%   General appearance: alert, cooperative  and appears stated age Head: Normocephalic, without obvious abnormality, atraumatic, male pattern alopecia Eyes: conjunctivae/corneas clear. PERRL, EOM's intact. Fundi benign. Nose: Nares normal. Septum midline. Mucosa normal. No drainage or sinus tenderness. Throat: lips, mucosa, and tongue normal; teeth and gums normal Neck: no adenopathy, no carotid bruit, no JVD, supple, symmetrical, trachea midline and thyroid not enlarged, symmetric, no tenderness/mass/nodules Resp: wheezes bilaterally Chest wall: no tenderness Cardio:  regular rate and rhythm, S1, S2 normal, no murmur, click, rub or gallop GI: soft, non-tender; bowel sounds normal; no masses,  no organomegaly Extremities: extremities normal, atraumatic, no cyanosis or edema Pulses: 2+ and symmetric Skin: Skin color, texture, turgor normal. No rashes or lesions Neurologic: Alert and oriented X 3, normal strength and tone. Normal symmetric reflexes. Normal coordination and gait   Labs on Admission:   Basename 03/22/11 1220 03/22/11 0739  NA -- 139  K -- 4.0  CL -- 103  CO2 -- 25  GLUCOSE -- 106*  BUN -- 28*  CREATININE 1.49* 1.37*  CALCIUM -- 9.3  MG -- --  PHOS -- --   No results found for this basename: AST:2,ALT:2,ALKPHOS:2,BILITOT:2,PROT:2,ALBUMIN:2 in the last 72 hours No results found for this basename: LIPASE:2,AMYLASE:2 in the last 72 hours  Basename 03/22/11 1220 03/22/11 0739  WBC 10.5 9.2  NEUTROABS -- --  HGB 12.5* 13.0  HCT 36.5* 37.9*  MCV 93.6 93.1  PLT 209 214   No results found for this basename: CKTOTAL:3,CKMB:3,CKMBINDEX:3,TROPONINI:3 in the last 72 hours No results found for this basename: TSH,T4TOTAL,FREET3,T3FREE,THYROIDAB in the last 72 hours No results found for this basename: VITAMINB12:2,FOLATE:2,FERRITIN:2,TIBC:2,IRON:2,RETICCTPCT:2 in the last 72 hours  Radiological Exams on Admission: Dg Chest 2 View  03/22/2011  *RADIOLOGY REPORT*  Clinical Data: Very short of breath.  COPD. Cough.  Home oxygen.  CHEST - 2 VIEW  Comparison: 12/17/2010  Findings: The heart size is normal.  Lungs are hyperinflated. There are scattered, calcified granuloma.  Within the medial right lower lobe, there is infiltrate, most consistent with infectious process.  There is bilateral pleural blunting, consistent with COPD.  IMPRESSION:  1.  COPD. 2.  Right lower lobe infiltrate.  Original Report Authenticated By: Glenice Bow, M.D.    Assessment/Plan Present on Admission:  .COPD exacerbation .CAP (community acquired  pneumonia) .HYPERLIPIDEMIA .HYPERTENSION   Plan to treat with iv abx, iv steroids, nebs. Continue home meds.     Leanora Murin 03/22/2011, 3:24 PM

## 2011-03-22 NOTE — ED Provider Notes (Signed)
History     CSN: [id number]  Arrival date & time 03/22/11  0706   First MD Initiated Contact with Patient 03/22/11 [phone]      Chief Complaint  Patient presents with  . Shortness of Breath    (Consider location/radiation/quality/duration/timing/severity/associated sxs/prior treatment) Patient is a 78 y.o. male presenting with shortness of breath. The history is provided by the patient.  Shortness of Breath  Associated symptoms include cough, shortness of breath and wheezing. Pertinent negatives include no chest pain and no fever.  pt w hx copd presents w non productive cough and increased wheezing/sob for past 3 days. Cough episodic. States feels like has mucous stuck but cant get it up. Cough episodic, no specific exacerbating or alleviating factors. No assoc sore throat, runny nose, body aches or other flu like symptoms. No chest pain. No leg pain or swelling. No orthopnea or pnd. w copd, notes no admissions in past several years. No recent steroid use. States uses home o2 and breathing tx bid at baseline. No fever or chills.   Past Medical History  Diagnosis Date  . COPD (chronic obstructive pulmonary disease)     History reviewed. No pertinent past surgical history.  History reviewed. No pertinent family history.  History  Substance Use Topics  . Smoking status: Former Research scientist (life sciences)  . Smokeless tobacco: Not on file  . Alcohol Use: No      Review of Systems  Constitutional: Negative for fever and chills.  HENT: Negative for neck pain.   Eyes: Negative for redness.  Respiratory: Positive for cough, shortness of breath and wheezing.   Cardiovascular: Negative for chest pain and leg swelling.  Gastrointestinal: Negative for abdominal pain.  Genitourinary: Negative for flank pain.  Musculoskeletal: Negative for back pain.  Skin: Negative for rash.  Neurological: Negative for headaches.  Hematological: Does not bruise/bleed easily.  Psychiatric/Behavioral: Negative for  confusion.    Allergies  Sulfa antibiotics  Home Medications   Current Outpatient Rx  Name Route Sig Dispense Refill  . ALBUTEROL SULFATE HFA 108 (90 BASE) MCG/ACT IN AERS Inhalation Inhale 2 puffs into the lungs every 6 (six) hours as needed. For shortness of breath    . ARFORMOTEROL TARTRATE 15 MCG/2ML IN NEBU Nebulization Take 15 mcg by nebulization 2 (two) times daily.    . ASPIRIN EC 81 MG PO TBEC Oral Take 81 mg by mouth daily.    . ATORVASTATIN CALCIUM 80 MG PO TABS Oral Take 40 mg by mouth every other day.    . BUDESONIDE 0.5 MG/2ML IN SUSP Nebulization Take 0.5 mg by nebulization 2 (two) times daily.    Marland Kitchen CALCIUM CARBONATE-VITAMIN D 500-200 MG-UNIT PO TABS Oral Take 1 tablet by mouth daily.    Marland Kitchen DOXAZOSIN MESYLATE 2 MG PO TABS Oral Take 2 mg by mouth daily.    Marland Kitchen FAMOTIDINE 20 MG PO TABS Oral Take 20 mg by mouth at bedtime.    Marland Kitchen HYDROCHLOROTHIAZIDE 12.5 MG PO CAPS Oral Take 12.5 mg by mouth daily.    Marland Kitchen LOSARTAN POTASSIUM-HCTZ 100-12.5 MG PO TABS Oral Take 1 tablet by mouth daily.    Creed Copper M PLUS PO TABS Oral Take 1 tablet by mouth daily.    Marland Kitchen OMEPRAZOLE 20 MG PO CPDR Oral Take 20 mg by mouth daily. 30-60 min before first meal      BP 110/76  Pulse 120  Temp(Src) 97.5 F (36.4 C) (Oral)  Resp 22  SpO2 91%  Physical Exam  Nursing note and vitals  reviewed. Constitutional: He is oriented to person, place, and time. He appears well-developed and well-nourished.  HENT:  Head: Atraumatic.  Nose: Nose normal.  Mouth/Throat: Oropharynx is clear and moist.  Eyes: Pupils are equal, round, and reactive to light.  Neck: Neck supple. No tracheal deviation present.  Cardiovascular: Regular rhythm, normal heart sounds and intact distal pulses.  Exam reveals no gallop and no friction rub.   No murmur heard. Pulmonary/Chest: No accessory muscle usage. He has wheezes. He has no rales.       Wheezing bilateral.   Abdominal: Soft. Bowel sounds are normal. He exhibits no distension.  There is no tenderness.  Musculoskeletal: Normal range of motion. He exhibits no edema and no tenderness.  Neurological: He is alert and oriented to person, place, and time.  Skin: Skin is warm and dry.  Psychiatric: He has a normal mood and affect.    ED Course  Procedures (including critical care time)   Results for orders placed during the hospital encounter of 03/22/11  CBC      Component Value Range   WBC 9.2  4.0 - 10.5 (K/uL)   RBC 4.07 (*) 4.22 - 5.81 (MIL/uL)   Hemoglobin 13.0  13.0 - 17.0 (g/dL)   HCT 37.9 (*) 39.0 - 52.0 (%)   MCV 93.1  78.0 - 100.0 (fL)   MCH 31.9  26.0 - 34.0 (pg)   MCHC 34.3  30.0 - 36.0 (g/dL)   RDW 13.6  11.5 - 15.5 (%)   Platelets 214  150 - 400 (K/uL)  BASIC METABOLIC PANEL      Component Value Range   Sodium 139  135 - 145 (mEq/L)   Potassium 4.0  3.5 - 5.1 (mEq/L)   Chloride 103  96 - 112 (mEq/L)   CO2 25  19 - 32 (mEq/L)   Glucose, Bld 106 (*) 70 - 99 (mg/dL)   BUN 28 (*) 6 - 23 (mg/dL)   Creatinine, Ser 1.37 (*) 0.50 - 1.35 (mg/dL)   Calcium 9.3  8.4 - 10.5 (mg/dL)   GFR calc non Af Amer 46 (*) >90 (mL/min)   GFR calc Af Amer 53 (*) >90 (mL/min)   Dg Chest 2 View  03/22/2011  *RADIOLOGY REPORT*  Clinical Data: Very short of breath.  COPD. Cough.  Home oxygen.  CHEST - 2 VIEW  Comparison: 12/17/2010  Findings: The heart size is normal.  Lungs are hyperinflated. There are scattered, calcified granuloma.  Within the medial right lower lobe, there is infiltrate, most consistent with infectious process.  There is bilateral pleural blunting, consistent with COPD.  IMPRESSION:  1.  COPD. 2.  Right lower lobe infiltrate.  Original Report Authenticated By: Glenice Bow, M.D.      MDM  Cxr. Albuterol and atrovent neb treatment. Prednisone po.   Persistent wheezing. Albuterol and atrovent neb. Labs.   Reviewed nursing notes.   Reviewed prior charts, xrays.    Date: 03/22/2011  Rate: 106  Rhythm: sinus tachycardia  QRS Axis:  normal  Intervals: normal  ST/T Wave abnormalities: nonspecific ST changes  Conduction Disutrbances:none  Narrative Interpretation:   Old EKG Reviewed: changes noted   rll pna on cxr. No recent hospitalization, will rx for cap. Rocephin and zithromax iv.   [facility] called to admit.      Mirna Mires, MD 03/22/11 [phone]

## 2011-03-22 NOTE — ED Notes (Signed)
The pt is c/o sob.  He has copd and woke up not able to breathe very well.  He is coughing  Non-productive and he has pain in his rt chest from coughing.  hes on home 02

## 2011-03-23 LAB — CBC
HCT: 36.9 % — ABNORMAL LOW (ref 39.0–52.0)
Hemoglobin: 12.6 g/dL — ABNORMAL LOW (ref 13.0–17.0)
MCH: 31.8 pg (ref 26.0–34.0)
MCHC: 34.1 g/dL (ref 30.0–36.0)

## 2011-03-23 LAB — BASIC METABOLIC PANEL
BUN: 36 mg/dL — ABNORMAL HIGH (ref 6–23)
Chloride: 99 mEq/L (ref 96–112)
GFR calc non Af Amer: 45 mL/min — ABNORMAL LOW (ref >90)
Glucose, Bld: 139 mg/dL — ABNORMAL HIGH (ref 70–99)
Potassium: 4.2 mEq/L (ref 3.5–5.1)

## 2011-03-23 NOTE — Progress Notes (Signed)
Utilization Review Completed.Donne Anon T1/14/2013

## 2011-03-23 NOTE — Progress Notes (Signed)
Subjective: Still has significant dyspnea with minimal movement   Physical Exam: Blood pressure 124/70, pulse 97, temperature 97.4 F (36.3 C), temperature source Oral, resp. rate 19, height 5\' 8"  (1.727 m), SpO2 93.00%. Patient Vitals for the past 24 hrs:  BP Temp Temp src Pulse Resp SpO2  03/23/11 1405 124/70 mmHg 97.4 F (36.3 C) - 97  19  93 %  03/23/11 0707 - - - - - 94 %  03/23/11 0539 117/66 mmHg 97.5 F (36.4 C) Oral 85  20  93 %  03/22/11 2113 - - - - - 94 %  03/22/11 2105 126/67 mmHg 97.5 F (36.4 C) Oral 84  21  92 %      Investigations:  No results found for this or any previous visit (from the past 240 hour(s)).   Basic Metabolic Panel:  Basename 03/23/11 0610 03/22/11 1220 03/22/11 0739  NA 136 -- 139  K 4.2 -- 4.0  CL 99 -- 103  CO2 25 -- 25  GLUCOSE 139* -- 106*  BUN 36* -- 28*  CREATININE 1.39* 1.49* --  CALCIUM 9.6 -- 9.3  MG -- -- --  PHOS -- -- --   Liver Function Tests: No results found for this basename: AST:2,ALT:2,ALKPHOS:2,BILITOT:2,PROT:2,ALBUMIN:2 in the last 72 hours   CBC:  Basename 03/23/11 0610 03/22/11 1220  WBC 11.3* 10.5  NEUTROABS -- --  HGB 12.6* 12.5*  HCT 36.9* 36.5*  MCV 93.2 93.6  PLT 216 209    Dg Chest 2 View  03/22/2011  *RADIOLOGY REPORT*  Clinical Data: Very short of breath.  COPD. Cough.  Home oxygen.  CHEST - 2 VIEW  Comparison: 12/17/2010  Findings: The heart size is normal.  Lungs are hyperinflated. There are scattered, calcified granuloma.  Within the medial right lower lobe, there is infiltrate, most consistent with infectious process.  There is bilateral pleural blunting, consistent with COPD.  IMPRESSION:  1.  COPD. 2.  Right lower lobe infiltrate.  Original Report Authenticated By: Glenice Bow, M.D.      Medications:  Scheduled:    . arformoterol  15 mcg Nebulization BID  . aspirin EC  81 mg Oral Daily  . azithromycin  500 mg Intravenous Q24H  . budesonide  0.5 mg Nebulization BID  .  calcium-vitamin D  1 tablet Oral Daily  . cefTRIAXone (ROCEPHIN)  IV  1 g Intravenous Q24H  . docusate sodium  100 mg Oral BID  . doxazosin  2 mg Oral Daily  . enoxaparin  40 mg Subcutaneous Q24H  . famotidine  20 mg Oral QHS  . hydrochlorothiazide  25 mg Oral Daily  . losartan  100 mg Oral Daily  . methylPREDNISolone (SOLU-MEDROL) injection  40 mg Intravenous Q12H  . multivitamins ther. w/minerals  1 tablet Oral Daily  . pantoprazole  40 mg Oral Q1200  . simvastatin  20 mg Oral QPC supper  . sodium chloride  3 mL Intravenous Q12H   Continuous:   Impression:  Principal Problem:  *COPD exacerbation Active Problems:  CAP (community acquired pneumonia)  HYPERLIPIDEMIA  HYPERTENSION     Plan: Continue iv steroids Continue iv antibiotics Check for dysphagia      LOS: 1 day   Rashaud Ybarbo, MD Pager: [phone] 03/23/2011, 5:33 PM

## 2011-03-23 NOTE — Progress Notes (Signed)
Speech Language/Pathology Clinical/Bedside Swallow Evaluation Patient Details  Name: Trevor Pitts MRN: [id number] DOB: [birth year] Today's Date: 03/23/2011  Past Medical History:  Past Medical History  Diagnosis Date  . COPD (chronic obstructive pulmonary disease)   . Asthma   . Shortness of breath   . Cancer   . Hypertension   . Recurrent upper respiratory infection (URI)   . Pneumonia   . H/O hiatal hernia    Past Surgical History:  Past Surgical History  Procedure Date  . Colon surgery    HPI:  Pt is an 78 year old male with COPD, GERD per pt. Now with right lower lobe pna, with multiple cases of pna this year. He states he often coughs with PO intake. Pt describes treatment for GERD indicating reflux cough vs aspiration.    Assessment/Recommendations/Treatment Plan    SLP Assessment Clinical Impression Statement: Pt without any overt s/s of aspiration during evaluation, however clinical presentation concerning for possible aspiration of reflux vs pharyngeal dysphagia with silent aspiration. Pt at risk for silent aspiration given respiratory compromise. Pt may continue diet at this time, but would benefit from objective testing prior to d/c to evaluate risk.  Risk for Aspiration: Moderate Other Related Risk Factors: History of GERD;History of pneumonia;Decreased respiratory status  Swallow Recommendations Recommended Consults: MBS Solid Consistency: Regular Liquid Consistency: Thin Medication Administration: Whole meds with liquid Supervision: Patient able to self feed Compensations: Slow rate;Small sips/bites Postural Changes and/or Swallow Maneuvers: Seated upright 90 degrees Oral Care Recommendations: Oral care BID;Patient independent with oral care  Treatment Plan Treatment Plan Recommendations:  (Defer treatment plan until completion of MBS)     Individuals Consulted Consulted and Agree with Results and Recommendations: Patient  Herbie [place], [place] CCC-SLP  [id number]  Lynann Beaver 03/23/2011,4:30 PM

## 2011-03-23 NOTE — Progress Notes (Signed)
   CARE MANAGEMENT NOTE 03/23/2011  Patient:  Trevor Pitts, Trevor Pitts   Account Number:  [id number]  Date Initiated:  03/23/2011  Documentation initiated by:  MAYO,HENRIETTA  Subjective/Objective Assessment:   78 yr-old male adm with PNA; lives with spouse, has home O2 through [place] and a neb machine through [place].     Action/Plan:   Anticipated DC Date:  03/24/2011   Anticipated DC Plan:  [place]  CM consult      [facility] Choice  HOME HEALTH   Choice offered to / List presented to:  C-1 Patient        [place] arranged  HH-1 RN  HH-10 DISEASE MANAGEMENT  HH-2 PT      Status of service:  Completed, signed off Medicare Important Message given?   (If response is "NO", the following Medicare IM given date fields will be blank) Date Medicare IM given:   Date Additional Medicare IM given:    Discharge Disposition:  [place]  Per UR Regulation:    Comments:  PCP:  Dr. Merrilee Seashore  03/23/11 [place] Pt's wife is currently at [facility] for rehab, he anticipates she will return home in 2 wks.  Adult sons live in area and provide very limited support.  Pt will need home health RN and PT, requests referral to [facility].  Referral made, information faxed per request.

## 2011-03-24 ENCOUNTER — Inpatient Hospital Stay (HOSPITAL_COMMUNITY): Payer: Medicare Other

## 2011-03-24 MED ORDER — PREDNISONE 50 MG PO TABS
80.0000 mg | ORAL_TABLET | Freq: Every day | ORAL | Status: DC
  Administered 2011-03-25: 80 mg via ORAL
  Filled 2011-03-24 (×2): qty 1

## 2011-03-24 MED ORDER — PREDNISONE 50 MG PO TABS
60.0000 mg | ORAL_TABLET | Freq: Every day | ORAL | Status: DC
  Filled 2011-03-24: qty 1

## 2011-03-24 MED ORDER — MOXIFLOXACIN HCL 400 MG PO TABS
400.0000 mg | ORAL_TABLET | Freq: Every day | ORAL | Status: DC
  Filled 2011-03-24 (×2): qty 1

## 2011-03-24 MED ORDER — GUAIFENESIN ER 600 MG PO TB12
600.0000 mg | ORAL_TABLET | Freq: Two times a day (BID) | ORAL | Status: DC
  Administered 2011-03-25: 600 mg via ORAL
  Filled 2011-03-24 (×2): qty 1

## 2011-03-24 MED ORDER — GUAIFENESIN ER 600 MG PO TB12: 600.0000 mg | ORAL_TABLET | Freq: Two times a day (BID) | ORAL | Status: DC

## 2011-03-24 MED ORDER — MOXIFLOXACIN HCL 400 MG PO TABS: 400.0000 mg | ORAL_TABLET | Freq: Every day | ORAL | Status: AC

## 2011-03-24 MED ORDER — PREDNISONE 10 MG PO TABS: 60.0000 mg | ORAL_TABLET | Freq: Every day | ORAL | Status: DC

## 2011-03-24 MED ORDER — GUAIFENESIN ER 600 MG PO TB12
600.0000 mg | ORAL_TABLET | Freq: Two times a day (BID) | ORAL | Status: DC
  Filled 2011-03-24: qty 1

## 2011-03-24 NOTE — Progress Notes (Addendum)
Patient ID: Trevor Pitts, male   DOB: 24-Jul-1924, 78 y.o.   MRN: [id number]  78 yo male, lives at home alone, on 2.5L of oxygen at home.  Completely independent.  Sees Dr. [name] for pulmonary issues.  His recent history of Gerd aggravating his COPD  Subjective: Improving but still has significant dyspnea with movement today.  Complains that cleaning chemicals in his room yesterday "took his breath away".   Physical Exam: Blood pressure 134/77, pulse 105, temperature 97.5 F (36.4 C), temperature source Oral, resp. rate 18, height 5\' 8"  (1.727 m), weight 74.435 kg (164 lb 1.6 oz), SpO2 95.00%. Patient Vitals for the past 24 hrs:  BP Temp Temp src Pulse Resp SpO2  03/24/11 0825 - - - 105  18  95 %  03/24/11 0528 134/77 mmHg 97.5 F (36.4 C) Oral 85  21  98 %  03/23/11 2027 112/69 mmHg 97.6 F (36.4 C) Oral 79  22  91 %  03/23/11 1935 - - - 92  20  92 %  03/23/11 1405 124/70 mmHg 97.4 F (36.3 C) - 97  19  93 %   General:  A&O, NAD, sitting up in chair reading paper. Lungs:  Coarse sound thru out, Wheeze posteriorly.  No increase WOB. CV:  RRR, S1, S2, no M/R/G Abdomin:  Firm, slight distention, non tender, + BS Ext:  No LE Edema bi-laterally. Neuro:  Non focal.    Basic Metabolic Panel:  Basename 03/23/11 0610 03/22/11 1220 03/22/11 0739  NA 136 -- 139  K 4.2 -- 4.0  CL 99 -- 103  CO2 25 -- 25  GLUCOSE 139* -- 106*  BUN 36* -- 28*  CREATININE 1.39* 1.49* --  CALCIUM 9.6 -- 9.3  MG -- -- --  PHOS -- -- --    CBC:  Basename 03/23/11 0610 03/22/11 1220  WBC 11.3* 10.5  NEUTROABS -- --  HGB 12.6* 12.5*  HCT 36.9* 36.5*  MCV 93.2 93.6  PLT 216 209    Medications:  Scheduled:    . arformoterol  15 mcg Nebulization BID  . aspirin EC  81 mg Oral Daily  . azithromycin  500 mg Intravenous Q24H  . budesonide  0.5 mg Nebulization BID  . calcium-vitamin D  1 tablet Oral Daily  . cefTRIAXone (ROCEPHIN)  IV  1 g Intravenous Q24H  . docusate sodium  100 mg Oral BID    . doxazosin  2 mg Oral Daily  . enoxaparin  40 mg Subcutaneous Q24H  . famotidine  20 mg Oral QHS  . hydrochlorothiazide  25 mg Oral Daily  . losartan  100 mg Oral Daily  . methylPREDNISolone (SOLU-MEDROL) injection  40 mg Intravenous Q12H  . multivitamins ther. w/minerals  1 tablet Oral Daily  . pantoprazole  40 mg Oral Q1200  . simvastatin  20 mg Oral QPC supper  . sodium chloride  3 mL Intravenous Q12H   Continuous:   Impression:  Principal Problem:  *COPD exacerbation Active Problems:  HYPERLIPIDEMIA  HYPERTENSION  CAP (community acquired pneumonia)     Plan: Continue iv steroids, change to PO Continue iv antibiotics, change to PO Avelox Elevated Creatinine (base line 1.0 - 1.2) Check for dysphagia, MBSS today Dispo:  Home with PT/ OT likely tomorrow.    LOS: 2 days   Melton Alar, [place] 03/24/2011, 9:08 AM

## 2011-03-24 NOTE — Progress Notes (Signed)
Patient seen and examined; mild dyspnea and also some wheezing heard on auscultation. Overall doing and feeling better. Will transition steroids and antibiotics to PO and probably d/c home tomorrow. Please refer to Imogene Burn, PA progress note for further details. I personally reviewed it and agree with A/P.   Barton Dubois, MD [phone]

## 2011-03-24 NOTE — Procedures (Signed)
Modified Barium Swallow Procedure Note Patient Details  Name: Trevor Pitts MRN: [id number] Date of Birth: 11/16/1924  Today's Date: 03/24/2011 Time:  -     Past Medical History:  Past Medical History  Diagnosis Date  . COPD (chronic obstructive pulmonary disease)   . Asthma   . Shortness of breath   . Cancer   . Hypertension   . Recurrent upper respiratory infection (URI)   . Pneumonia   . H/O hiatal hernia    Past Surgical History:  Past Surgical History  Procedure Date  . Colon surgery    HPI:  Pt is an 78 year old male with COPD, GERD per pt. Now with right lower lobe pna, with multiple cases of pna this year. He states he often coughs with PO intake. Pt describes treatment for GERD indicating reflux cough vs aspiration. MBS to determine aspiration risk.      Recommendation/Prognosis  Clinical Impression Dysphagia Diagnosis: Within Functional Limits Clinical impression: Pt presents with a functional oral and oropharyngeal swallow with a mild delay in swallow initiation that is not abnormal for pt's age. Though no penetration or aspiration was observed in this study, the pt may be at mild risk, especially when respiratory status is poor. SLP provided aspiraiton precautions to aid in decreasing risk. Pt may continue regular thin diet, no f/u needed.  Swallow Recommendations Solid Consistency: Regular Liquid Consistency: Thin Liquid Administration via: Cup Medication Administration: Whole meds with liquid Supervision: Patient able to self feed Compensations: Slow rate;Small sips/bites Postural Changes and/or Swallow Maneuvers: Seated upright 90 degrees Oral Care Recommendations: Oral care BID;Patient independent with oral care Follow up Recommendations: None   Individuals Consulted Consulted and Agree with Results and Recommendations: Patient  Herbie [place], [employer] CCC-SLP [id number]   Lynann Beaver 03/24/2011, 10:00 AM

## 2011-03-25 DIAGNOSIS — R1310 Dysphagia, unspecified: Secondary | ICD-10-CM | POA: Diagnosis present

## 2011-03-25 LAB — BASIC METABOLIC PANEL
Calcium: 9.8 mg/dL (ref 8.4–10.5)
Creatinine, Ser: 1.35 mg/dL (ref 0.50–1.35)
GFR calc Af Amer: 54 mL/min — ABNORMAL LOW (ref >90)

## 2011-03-25 LAB — CBC
MCH: 32 pg (ref 26.0–34.0)
MCV: 92.7 fL (ref 78.0–100.0)
Platelets: 242 10*3/uL (ref 150–400)
RDW: 13.6 % (ref 11.5–15.5)

## 2011-03-25 MED ORDER — PREDNISONE 20 MG PO TABS: 80.0000 mg | ORAL_TABLET | Freq: Every day | ORAL | Status: AC

## 2011-03-25 NOTE — Progress Notes (Signed)
   CARE MANAGEMENT NOTE 03/25/2011  Patient:  Trevor Pitts, Trevor Pitts   Account Number:  [id number]  Date Initiated:  03/23/2011  Documentation initiated by:  MAYO,HENRIETTA  Subjective/Objective Assessment:   78 yr-old male adm with PNA; lives with spouse, has home O2 through [place] and a neb machine through [place].     Action/Plan:   Anticipated DC Date:  03/25/2011   Anticipated DC Plan:  [place]  CM consult      [facility] Choice  HOME HEALTH   Choice offered to / List presented to:  C-1 Patient        [place] arranged  HH-1 RN  HH-10 DISEASE MANAGEMENT  HH-2 PT      Status of service:  Completed, signed off Medicare Important Message given?   (If response is "NO", the following Medicare IM given date fields will be blank) Date Medicare IM given:   Date Additional Medicare IM given:    Discharge Disposition:  [place]  Per UR Regulation:    Comments:  PCP:  Dr. Merrilee Seashore  03/25/11 [place] RN MSN CCM Pt d/c'd to home, notified Hoyle Sauer with [facility], faxed d/c summary per request.  03/23/11 1200 [place] RN MSN CCM Pt's wife is currently at [facility] for rehab, he anticipates she will return home in 2 wks.  Adult sons live in area and provide very limited support.  Pt will need home health RN and PT, requests referral to [facility].  Referral made, information faxed per request.

## 2011-03-25 NOTE — Discharge Summary (Signed)
Patient ID: Trevor Pitts MRN: [id number] DOB/AGE: [birth year] 78 y.o.  Admit date: 03/22/2011 Discharge date: 03/25/2011  Primary Care Physician:  Merrilee Seashore, MD, MD Pulmonologist:  Christinia Gully, MD  Discharge Diagnoses:   1-COPD exacerbation 2-CAP (community acquired pneumonia) 3-HYPERLIPIDEMIA 4-HYPERTENSION 5-Dysphagia 6-GERD    Current Discharge Medication List    START taking these medications   Details  guaiFENesin (MUCINEX) 600 MG 12 hr tablet Take 1 tablet (600 mg total) by mouth 2 (two) times daily.    moxifloxacin (AVELOX) 400 MG tablet Take 1 tablet (400 mg total) by mouth daily at 6 PM. Qty: 6 tablet, Refills: 0    predniSONE (DELTASONE) 20 MG tablet Take 4 tablets (80 mg total) by mouth daily. Qty: 20 tablet, Refills: 0      CONTINUE these medications which have NOT CHANGED   Details  albuterol (PROVENTIL HFA;VENTOLIN HFA) 108 (90 BASE) MCG/ACT inhaler Inhale 2 puffs into the lungs every 6 (six) hours as needed. For shortness of breath    arformoterol (BROVANA) 15 MCG/2ML NEBU Take 15 mcg by nebulization 2 (two) times daily.    aspirin EC 81 MG tablet Take 81 mg by mouth daily.    atorvastatin (LIPITOR) 80 MG tablet Take 40 mg by mouth every other day.    budesonide (PULMICORT) 0.5 MG/2ML nebulizer solution Take 0.5 mg by nebulization 2 (two) times daily.    calcium-vitamin D (OSCAL WITH D) 500-200 MG-UNIT per tablet Take 1 tablet by mouth daily.    doxazosin (CARDURA) 2 MG tablet Take 2 mg by mouth daily.    famotidine (PEPCID) 20 MG tablet Take 20 mg by mouth at bedtime.    hydrochlorothiazide (MICROZIDE) 12.5 MG capsule Take 12.5 mg by mouth daily.    losartan-hydrochlorothiazide (HYZAAR) 100-12.5 MG per tablet Take 1 tablet by mouth daily.    Multiple Vitamins-Minerals (MULTIVITAMINS THER. W/MINERALS) TABS Take 1 tablet by mouth daily.    omeprazole (PRILOSEC) 20 MG capsule Take 20 mg by mouth daily. 30-60 min before first meal          Brief H and P: From the admission note:  78 yo man with known COPD presents with non productive cough, wheezing and increase dyspnea for the past 3 days. He also has developed a right side chest pain. This AM his breathing got really tight and he called his son to give him a ride to the ED.   Plan at the time of admission:  Plan to treat with iv abx, iv steroids, nebs. Continue home meds.  Next 48 hours Mr. spieker was initially treated with IV Solu-Medrol and IV antibiotics clinically he improved stating he felt much better. He was able to get up and ambulate about the room without shortness of breath or difficulty he was changed to by mouth steroids and by mouth Avelox shortly before discharge and continued to do well.    His shortness of breath appeared to be related to acute community acquired pneumonia as evidenced on x-ray, as well as COPD exacerbation. The patient had recently been placed on GERD therapy by Dr. [name].  He reports that this has helped his symptoms.  He complained of some difficulty swallowing particularly large pills and some foods.  Given that he had a right lower lobe infiltrate a swallowing evaluation with modified barium swallow was completed. No aspiration was seen on exam. However speech therapy felt that he was still at some risk for aspiration and educated him regarding aspiration precautions. They did not recommend  any dietary changes.  Given his GERD symptoms and mild swallowing difficulties would consider an outpatient GI evaluation.  Physical Exam on Discharge: General: Alert, awake, oriented x3, in no acute distress. Dressed, walking about the room. Pleasant HEENT: No bruits, no goiter. Heart: Regular rate and rhythm, without murmurs, rubs, gallops. Lungs: Expiratory wheeze bilaterally. Abdomen: Soft, nontender, large hernia palpated, positive bowel sounds. Extremities: No clubbing cyanosis or edema with positive pedal pulses. Neuro: Grossly intact,  nonfocal.  Filed Vitals:   03/24/11 2025 03/24/11 2228 03/25/11 0515 03/25/11 0900  BP:  138/68 126/85   Pulse: 102 95 80   Temp:  97.6 F (36.4 C) 97.6 F (36.4 C)   TempSrc:  Oral Oral   Resp: 20 20 20    Height:      Weight:      SpO2: 96% 95% 97% 97%     Intake/Output Summary (Last 24 hours) at 03/25/11 0943 Last data filed at 03/25/11 0900  Gross per 24 hour  Intake    480 ml  Output      0 ml  Net    480 ml    Basic Metabolic Panel:  Lab [id number] 0630 03/23/11 0610  NA 139 136  K 4.7 4.2  CL 101 99  CO2 29 25  GLUCOSE 138* 139*  BUN 39* 36*  CREATININE 1.35 1.39*  CALCIUM 9.8 9.6  MG -- --  PHOS -- --   CBC:  Lab 03/25/11 0630 03/23/11 0610  WBC 11.5* 11.3*  NEUTROABS -- --  HGB 13.1 12.6*  HCT 37.9* 36.9*  MCV 92.7 93.2  PLT 242 216    Significant Diagnostic Studies:  Dg Chest 2 View  03/22/2011  *RADIOLOGY REPORT*    IMPRESSION:  1.  COPD. 2.  Right lower lobe infiltrate.  Dg Swallowing Func-no Report  03/24/2011  CLINICAL DATA: dysphagia   FLUOROSCOPY FOR SWALLOWING FUNCTION STUDY: Clinical impression: Pt presents with a functional oral and oropharyngeal swallow with a mild delay in swallow initiation that is not abnormal for pt's age. Though no penetration or aspiration was observed in this study, the pt may be at mild risk, especially when respiratory status is poor. SLP provided aspiraiton precautions to aid in decreasing risk. Pt may continue regular thin diet, no f/u needed.  Swallow Recommendations :  Solid Consistency: Regular, Liquid Consistency: Thin    Disposition and Follow-up:  Discharge Orders    Future Appointments: Provider: Department: Dept Phone: Center:   04/16/2011 10:45 AM Christinia Gully, MD [facility] [phone] None     Future Orders Please Complete By Expires   Diet - low sodium heart healthy      Increase activity slowly      Discharge instructions      Comments:   If your wheezing becomes worse please call Dr.  Gustavus Bryant office to be seen.     Follow-up Information    Follow up with Christinia Gully, MD on 04/16/2011. (10:45 am to see Dr. [name], Pulmonology)    Contact information:   [address] [place] [phone]       Follow up with [facility], MD. Schedule an appointment as soon as possible for a visit in 1 month.   Contact information:   [place], [place]         Mr.  Lein will be sent home with a home health RN to reassess  his breathing, and a home Physical Therapy Safety Evaluation.  If GERD and mild dysphagia continued to be problematic would recommend gastroenterology evaluation.   Time spent on Discharge: 40 min  Signed: Melton Alar 03/25/2011, 9:43 AM [phone]

## 2011-03-26 DIAGNOSIS — J441 Chronic obstructive pulmonary disease with (acute) exacerbation: Secondary | ICD-10-CM | POA: Diagnosis not present

## 2011-03-26 DIAGNOSIS — R5381 Other malaise: Secondary | ICD-10-CM | POA: Diagnosis not present

## 2011-03-26 DIAGNOSIS — I10 Essential (primary) hypertension: Secondary | ICD-10-CM | POA: Diagnosis not present

## 2011-03-26 DIAGNOSIS — R1310 Dysphagia, unspecified: Secondary | ICD-10-CM | POA: Diagnosis not present

## 2011-03-26 DIAGNOSIS — Z8701 Personal history of pneumonia (recurrent): Secondary | ICD-10-CM | POA: Diagnosis not present

## 2011-03-31 DIAGNOSIS — I10 Essential (primary) hypertension: Secondary | ICD-10-CM | POA: Diagnosis not present

## 2011-03-31 DIAGNOSIS — Z8701 Personal history of pneumonia (recurrent): Secondary | ICD-10-CM | POA: Diagnosis not present

## 2011-03-31 DIAGNOSIS — R1310 Dysphagia, unspecified: Secondary | ICD-10-CM | POA: Diagnosis not present

## 2011-03-31 DIAGNOSIS — J441 Chronic obstructive pulmonary disease with (acute) exacerbation: Secondary | ICD-10-CM | POA: Diagnosis not present

## 2011-03-31 DIAGNOSIS — R5381 Other malaise: Secondary | ICD-10-CM | POA: Diagnosis not present

## 2011-04-02 DIAGNOSIS — J441 Chronic obstructive pulmonary disease with (acute) exacerbation: Secondary | ICD-10-CM | POA: Diagnosis not present

## 2011-04-02 DIAGNOSIS — I10 Essential (primary) hypertension: Secondary | ICD-10-CM | POA: Diagnosis not present

## 2011-04-02 DIAGNOSIS — R5383 Other fatigue: Secondary | ICD-10-CM | POA: Diagnosis not present

## 2011-04-02 DIAGNOSIS — Z8701 Personal history of pneumonia (recurrent): Secondary | ICD-10-CM | POA: Diagnosis not present

## 2011-04-02 DIAGNOSIS — R1310 Dysphagia, unspecified: Secondary | ICD-10-CM | POA: Diagnosis not present

## 2011-04-06 DIAGNOSIS — C61 Malignant neoplasm of prostate: Secondary | ICD-10-CM | POA: Diagnosis not present

## 2011-04-09 DIAGNOSIS — Z8701 Personal history of pneumonia (recurrent): Secondary | ICD-10-CM | POA: Diagnosis not present

## 2011-04-09 DIAGNOSIS — J441 Chronic obstructive pulmonary disease with (acute) exacerbation: Secondary | ICD-10-CM | POA: Diagnosis not present

## 2011-04-09 DIAGNOSIS — R1310 Dysphagia, unspecified: Secondary | ICD-10-CM | POA: Diagnosis not present

## 2011-04-09 DIAGNOSIS — R5381 Other malaise: Secondary | ICD-10-CM | POA: Diagnosis not present

## 2011-04-09 DIAGNOSIS — I10 Essential (primary) hypertension: Secondary | ICD-10-CM | POA: Diagnosis not present

## 2011-04-16 ENCOUNTER — Ambulatory Visit (INDEPENDENT_AMBULATORY_CARE_PROVIDER_SITE_OTHER)
Admission: RE | Admit: 2011-04-16 | Discharge: 2011-04-16 | Disposition: A | Discharge status: Home / Self Care | Payer: Medicare Other | Source: Ambulatory Visit | Attending: Internal Medicine | Admitting: Internal Medicine

## 2011-04-16 ENCOUNTER — Encounter: Payer: Self-pay | Admitting: Internal Medicine

## 2011-04-16 ENCOUNTER — Telehealth: Payer: Self-pay | Admitting: Internal Medicine

## 2011-04-16 ENCOUNTER — Ambulatory Visit (INDEPENDENT_AMBULATORY_CARE_PROVIDER_SITE_OTHER): Payer: Medicare Other | Admitting: Internal Medicine

## 2011-04-16 VITALS — BP 118/70 | HR 85 | Temp 97.8°F | Ht 68.0 in | Wt 177.6 lb

## 2011-04-16 DIAGNOSIS — R1310 Dysphagia, unspecified: Secondary | ICD-10-CM | POA: Diagnosis not present

## 2011-04-16 DIAGNOSIS — R05 Cough: Secondary | ICD-10-CM | POA: Diagnosis not present

## 2011-04-16 DIAGNOSIS — J189 Pneumonia, unspecified organism: Secondary | ICD-10-CM

## 2011-04-16 DIAGNOSIS — J449 Chronic obstructive pulmonary disease, unspecified: Secondary | ICD-10-CM | POA: Diagnosis not present

## 2011-04-16 NOTE — Progress Notes (Signed)
Subjective:     Patient ID: Trevor Pitts, male   DOB: 07-14-1924  MRN: [id number]  HPI  Brief patient profile:   86yowm quit smoking around 1970 due to Vocal cord problems per ENT but no residual symptoms but GOLD III documented 03/2009.  February 13, 2009 cc variable sob x 10 years with PFT's at [facility] but doesn't remember when gradually worse to point where  needs to go slow and wear 02. Also cough more daytime clear mucus with occasional sensation of throat closing up, mucus getting stuck at level of suprasternal notch and choking him. rec stop benazepril  start benicar 40 mg one daily  Stop fish oil until no longer coughing, then ok to take it again   March 15, 2009 1 month followup with PFT's. Pt states that breathing is the same- no better or worse. cough is gone. GOLD III rec start neb brovana /budoside    March 28, 2010 ov f/u copd GOLD III with doe walks flat ok including mall as long as takes time. main concern is cough more day than night more dry than wet. seems to choke sometimes drinking water.  rx ppi and diet/ f/u if not better   Admit date: 03/22/2011  Discharge date: 03/25/2011  Primary Care Physician: Merrilee Seashore, MD, MD  Pulmonologist: Christinia Gully, MD  Discharge Diagnoses:  1-COPD exacerbation 2-CAP (community acquired pneumonia) 3-HYPERLIPIDEMIA 4-HYPERTENSION 5-Dysphagia  6-GERD   04/16/2011 f/u ov/Trevor Pitts cc no doe as long as wear 2lpm  X  [place] [employer] [facility] Doesn't sleep on 02 .  No cough.  Over using daytime saba and does not really understand maint vs prn rx  Sleeping ok without nocturnal  or early am exacerbation  of respiratory  c/o's or need for noct saba. Also denies any obvious fluctuation of symptoms with weather or environmental changes or other aggravating or alleviating factors except as outlined above   ROS  At present neg for  any significant sore throat, dysphagia, itching, sneezing,  nasal congestion or excess/  purulent secretions,  fever, chills, sweats, unintended wt loss, pleuritic or exertional cp, hempoptysis, orthopnea pnd or leg swelling.  Also denies presyncope, palpitations, heartburn, abdominal pain, nausea, vomiting, diarrhea  or change in bowel or urinary habits, dysuria,hematuria,  rash, arthralgias, visual complaints, headache, numbness weakness or ataxia.      Past Medical History:  C O P D.........................................................Marland KitchenWert  - PFT's March 15, 2009 FEV1 1.05 (44%) ratio 39 with 14% better after B2 and DLC048%  Hyperlipidemia  Hypertension        Review of Systems     Objective:   Physical Exam    wt 174 February 13, 2009 > 177 March 15, 2009 > 182 April 12, 2009 > 177 March 30, 2010 > 177 04/16/2011  In general robust pleasant amb wm nad  HEENT mild turbinate edema. Oropharynx no thrush or excess pnd or cobblestoning. No JVD or cervical adenopathy. Mild accessory muscle hypertrophy. Trachea midline, nl thryroid. Chest was hyperinflated by percussion with diminished breath sounds and moderate increased exp time without wheeze. Hoover sign positive at mid inspiration. Regular rate and rhythm without murmur gallop or rub or increase P2 or edema. Abd: no hsm, nl excursion. Ext warm without cyanosis or clubbing.  CXR  04/16/2011 :  Comparison: PA and lateral chest 03/22/2011, 12/17/2010, 06/12/2009 and 05/01/2009.  Findings: Lungs are emphysematous with scattered calcified granulomata. No focal airspace disease or effusion. Heart size is normal.  IMPRESSION: No  acute finding   Assessment:         Plan:

## 2011-04-16 NOTE — Patient Instructions (Addendum)
Plan A is to use your brovana and budesonide perfectly regularly Plan B is to use your albuterol inhaler 2 puffs every 4 hours if needed when you can't catch your breath for any reason Plan C is to use your albuterol nebulizer if you still can't catch your breath but if you start needing this you need to call to see me or my nurse practioner Tammy  Work on inhaler technique:  relax and gently blow all the way out then take a nice smooth deep breath back in, triggering the inhaler at same time you start breathing in.  Hold for up to 5 seconds if you can.  Rinse and gargle with water when done   If your mouth or throat starts to bother you,   I suggest you time the inhaler to your dental care and after using the inhaler(s) brush teeth and tongue with a baking soda containing toothpaste and when you rinse this out, gargle with it first to see if this helps your mouth and throat.    Please remember to go to   x-ray department downstairs for your tests - we will call you with the results when they are available.  Please schedule a follow up visit in 3 months but call sooner if needed

## 2011-04-19 NOTE — Assessment & Plan Note (Signed)
Swallow eval reviewed from 03/22/11 > no restrictions

## 2011-04-19 NOTE — Assessment & Plan Note (Signed)
-   PFT's March 15, 2009 FEV1 1.05 (44%) ratio 39 with 14% better after B2 and DLC048%   - HFA 75% p coaching 04/16/11  He has GOLD III copd with sign reversibility and chronic 02 dep with exertion back to baseline p cap/aecopd but confused about use of multiiple nebs/mdis  The proper method of use, as well as anticipated side effects, of this metered-dose inhaler are discussed and demonstrated to the patient. Improved to 75% with coaching  I had an extended discussion with the patient today lasting 15 to 20 minutes of a 25 minute visit on the following issues:  Plan A thru E to avoid the ER in the future See instructions for specific recommendations which were reviewed directly with the patient who was given a copy with highlighter outlining the key components.

## 2011-04-19 NOTE — Assessment & Plan Note (Signed)
cxr reviewed > resolved, with neg mod barium swallow reviewed from 03/22/11 > passed ok

## 2011-04-20 NOTE — Progress Notes (Signed)
Quick Note:  Spoke with pt and notified of results per Dr. Wert. Pt verbalized understanding and denied any questions.  ______ 

## 2011-04-22 DIAGNOSIS — H43819 Vitreous degeneration, unspecified eye: Secondary | ICD-10-CM | POA: Diagnosis not present

## 2011-04-22 DIAGNOSIS — H353190 Nonexudative age-related macular degeneration, unspecified eye, stage unspecified: Secondary | ICD-10-CM | POA: Diagnosis not present

## 2011-04-22 DIAGNOSIS — H2510 Age-related nuclear cataract, unspecified eye: Secondary | ICD-10-CM | POA: Diagnosis not present

## 2011-04-23 DIAGNOSIS — R1310 Dysphagia, unspecified: Secondary | ICD-10-CM | POA: Diagnosis not present

## 2011-04-23 DIAGNOSIS — J441 Chronic obstructive pulmonary disease with (acute) exacerbation: Secondary | ICD-10-CM | POA: Diagnosis not present

## 2011-04-23 DIAGNOSIS — I10 Essential (primary) hypertension: Secondary | ICD-10-CM | POA: Diagnosis not present

## 2011-04-23 DIAGNOSIS — Z8701 Personal history of pneumonia (recurrent): Secondary | ICD-10-CM | POA: Diagnosis not present

## 2011-04-23 DIAGNOSIS — R5381 Other malaise: Secondary | ICD-10-CM | POA: Diagnosis not present

## 2011-05-01 DIAGNOSIS — R1310 Dysphagia, unspecified: Secondary | ICD-10-CM | POA: Diagnosis not present

## 2011-05-01 DIAGNOSIS — Z8701 Personal history of pneumonia (recurrent): Secondary | ICD-10-CM | POA: Diagnosis not present

## 2011-05-01 DIAGNOSIS — R5381 Other malaise: Secondary | ICD-10-CM | POA: Diagnosis not present

## 2011-05-01 DIAGNOSIS — I10 Essential (primary) hypertension: Secondary | ICD-10-CM | POA: Diagnosis not present

## 2011-07-01 DIAGNOSIS — Z79899 Other long term (current) drug therapy: Secondary | ICD-10-CM | POA: Diagnosis not present

## 2011-07-01 DIAGNOSIS — I10 Essential (primary) hypertension: Secondary | ICD-10-CM | POA: Diagnosis not present

## 2011-07-01 DIAGNOSIS — J449 Chronic obstructive pulmonary disease, unspecified: Secondary | ICD-10-CM | POA: Diagnosis not present

## 2011-07-01 DIAGNOSIS — E785 Hyperlipidemia, unspecified: Secondary | ICD-10-CM | POA: Diagnosis not present

## 2011-07-01 DIAGNOSIS — I2510 Atherosclerotic heart disease of native coronary artery without angina pectoris: Secondary | ICD-10-CM | POA: Diagnosis not present

## 2011-07-08 DIAGNOSIS — M19019 Primary osteoarthritis, unspecified shoulder: Secondary | ICD-10-CM | POA: Diagnosis not present

## 2011-07-08 DIAGNOSIS — I10 Essential (primary) hypertension: Secondary | ICD-10-CM | POA: Diagnosis not present

## 2011-07-08 DIAGNOSIS — E785 Hyperlipidemia, unspecified: Secondary | ICD-10-CM | POA: Diagnosis not present

## 2011-07-08 DIAGNOSIS — I2510 Atherosclerotic heart disease of native coronary artery without angina pectoris: Secondary | ICD-10-CM | POA: Diagnosis not present

## 2011-07-22 ENCOUNTER — Ambulatory Visit (INDEPENDENT_AMBULATORY_CARE_PROVIDER_SITE_OTHER): Payer: Medicare Other | Admitting: Internal Medicine

## 2011-07-22 ENCOUNTER — Encounter: Payer: Self-pay | Admitting: Internal Medicine

## 2011-07-22 VITALS — BP 100/60 | HR 72 | Temp 97.8°F | Ht 68.5 in | Wt 176.8 lb

## 2011-07-22 DIAGNOSIS — J449 Chronic obstructive pulmonary disease, unspecified: Secondary | ICD-10-CM | POA: Diagnosis not present

## 2011-07-22 NOTE — Patient Instructions (Signed)
No change in your plan but keep working on your inhaler technique so you can use it in an emergency   If you are satisfied with your treatment plan let your doctor know and he/she can either refill your medications or you can return here when your prescription runs out.     If in any way you are not 100% satisfied,  please tell us.  If 100% better, tell your friends!

## 2011-07-22 NOTE — Progress Notes (Signed)
Subjective:     Patient ID: Trevor Pitts, male   DOB: [birth year]  MRN: [id number]    Brief patient profile:   86  yowm quit smoking around 1970 due to Vocal cord problems per ENT but no residual symptoms but GOLD III documented 03/2009.  HPI  February 13, 2009 cc variable sob x 10 years with PFT's at [facility] but doesn't remember when gradually worse to point where  needs to go slow and wear 02. Also cough more daytime clear mucus with occasional sensation of throat closing up, mucus getting stuck at level of suprasternal notch and choking him. rec stop benazepril  start benicar 40 mg one daily  Stop fish oil until no longer coughing, then ok to take it again   March 15, 2009 1 month followup with PFT's. Pt states that breathing is the same- no better or worse. cough is gone. GOLD III rec start neb brovana /budoside    March 28, 2010 ov f/u copd GOLD III with doe walks flat ok including mall as long as takes time. main concern is cough more day than night more dry than wet. seems to choke sometimes drinking water.  rx ppi and diet/ f/u if not better    Admit date: 03/22/2011  Discharge date: 03/25/2011  Primary Care Physician: Trevor Seashore, MD, MD  Pulmonologist: Trevor Gully, MD  Discharge Diagnoses:  1-COPD exacerbation 2-CAP (community acquired pneumonia) 3-HYPERLIPIDEMIA 4-HYPERTENSION 5-Dysphagia  6-GERD   04/16/2011 f/u ov/Trevor Pitts cc no doe as long as wear 2lpm  X  [place] [employer] [facility]. Doesn't sleep on 02 .  No cough.  Over using daytime saba and does not really understand maint vs prn rx  rec Plan A is to use your brovana and budesonide perfectly regularly Plan B is to use your albuterol inhaler 2 puffs every 4 hours if needed when you can't catch your breath for any reason Plan C is to use your albuterol nebulizer if you still can't catch your breath but if you start needing this you need to call to see me or my nurse practioner [place] Work on  inhaler technique    07/22/2011 f/u ov/Trevor Pitts cc  53mo f/u- breathing has been doing well, little sob. Pt states he still wears 2LM O2 with exertion and QHS.  - best he's done in years, still not able to use hfa (see imp/plan) no cough, no overt sinus or hb symptoms.  Sleeping ok without nocturnal  or early am exacerbation  of respiratory  c/o's or need for noct saba. Also denies any obvious fluctuation of symptoms with weather or environmental changes or other aggravating or alleviating factors except as outlined above   ROS  At present neg for  any significant sore throat, dysphagia, itching, sneezing,  nasal congestion or excess/ purulent secretions,  fever, chills, sweats, unintended wt loss, pleuritic or exertional cp, hempoptysis, orthopnea pnd or leg swelling.  Also denies presyncope, palpitations, heartburn, abdominal pain, nausea, vomiting, diarrhea  or change in bowel or urinary habits, dysuria,hematuria,  rash, arthralgias, visual complaints, headache, numbness weakness or ataxia.      Past Medical History:  C O P D.........................................................Marland KitchenWert  - PFT's March 15, 2009 FEV1 1.05 (44%) ratio 39 with 14% better after B2 and DLC048%  Hyperlipidemia  Hypertension       Objective:   Physical Exam  Wt 174 February 13, 2009 >  > 177 March 30, 2010 > 177 04/16/2011 > 07/22/2011  176 In general robust  pleasant amb wm nad  HEENT mild turbinate edema. Oropharynx no thrush or excess pnd or cobblestoning. No JVD or cervical adenopathy. Mild accessory muscle hypertrophy. Trachea midline, nl thryroid. Chest was hyperinflated by percussion with diminished breath sounds and moderate increased exp time without wheeze. Hoover sign positive at mid inspiration. Regular rate and rhythm without murmur gallop or rub or increase P2 or edema. Abd: no hsm, nl excursion. Ext warm without cyanosis or clubbing.  CXR  04/16/2011 :  Comparison: PA and lateral chest 03/22/2011,  12/17/2010, 06/12/2009 and 05/01/2009.  Findings: Lungs are emphysematous with scattered calcified granulomata. No focal airspace disease or effusion. Heart size is normal.  IMPRESSION: No acute finding   Assessment:         Plan:

## 2011-07-22 NOTE — Assessment & Plan Note (Signed)
-   PFT's March 15, 2009 FEV1 1.05 (44%) ratio 39 with 14% better after B2 and DLC048%   - HFA 25% p coaching 07/22/2011   GOLD III with sign ab component improved on laba/ics by neb  The proper method of use, as well as anticipated side effects, of a metered-dose inhaler are discussed and demonstrated to the patient. Improved effectiveness after extensive coaching during this visit to a level of approximately  25% no better  He is at risk of not being able to control AB component s access to neb but otherwise doing well and f/u can be prn refills or they can be done by his primary doctor, discussed

## 2011-08-19 DIAGNOSIS — H2510 Age-related nuclear cataract, unspecified eye: Secondary | ICD-10-CM | POA: Diagnosis not present

## 2011-08-19 DIAGNOSIS — H353190 Nonexudative age-related macular degeneration, unspecified eye, stage unspecified: Secondary | ICD-10-CM | POA: Diagnosis not present

## 2011-08-19 DIAGNOSIS — H43819 Vitreous degeneration, unspecified eye: Secondary | ICD-10-CM | POA: Diagnosis not present

## 2011-10-06 DIAGNOSIS — C61 Malignant neoplasm of prostate: Secondary | ICD-10-CM | POA: Diagnosis not present

## 2011-11-24 DIAGNOSIS — R300 Dysuria: Secondary | ICD-10-CM | POA: Diagnosis not present

## 2011-11-24 DIAGNOSIS — R351 Nocturia: Secondary | ICD-10-CM | POA: Diagnosis not present

## 2011-11-24 DIAGNOSIS — C61 Malignant neoplasm of prostate: Secondary | ICD-10-CM | POA: Diagnosis not present

## 2011-12-28 DIAGNOSIS — I10 Essential (primary) hypertension: Secondary | ICD-10-CM | POA: Diagnosis not present

## 2011-12-28 DIAGNOSIS — R109 Unspecified abdominal pain: Secondary | ICD-10-CM | POA: Diagnosis not present

## 2011-12-28 DIAGNOSIS — M549 Dorsalgia, unspecified: Secondary | ICD-10-CM | POA: Diagnosis not present

## 2011-12-28 DIAGNOSIS — Z23 Encounter for immunization: Secondary | ICD-10-CM | POA: Diagnosis not present

## 2012-01-13 DIAGNOSIS — Z0000 Encounter for general adult medical examination without abnormal findings: Secondary | ICD-10-CM | POA: Diagnosis not present

## 2012-01-13 DIAGNOSIS — J449 Chronic obstructive pulmonary disease, unspecified: Secondary | ICD-10-CM | POA: Diagnosis not present

## 2012-01-13 DIAGNOSIS — I2510 Atherosclerotic heart disease of native coronary artery without angina pectoris: Secondary | ICD-10-CM | POA: Diagnosis not present

## 2012-01-13 DIAGNOSIS — E785 Hyperlipidemia, unspecified: Secondary | ICD-10-CM | POA: Diagnosis not present

## 2012-01-13 DIAGNOSIS — I10 Essential (primary) hypertension: Secondary | ICD-10-CM | POA: Diagnosis not present

## 2012-01-13 DIAGNOSIS — Z1331 Encounter for screening for depression: Secondary | ICD-10-CM | POA: Diagnosis not present

## 2012-01-20 DIAGNOSIS — Z23 Encounter for immunization: Secondary | ICD-10-CM | POA: Diagnosis not present

## 2012-01-20 DIAGNOSIS — I4949 Other premature depolarization: Secondary | ICD-10-CM | POA: Diagnosis not present

## 2012-01-20 DIAGNOSIS — M810 Age-related osteoporosis without current pathological fracture: Secondary | ICD-10-CM | POA: Diagnosis not present

## 2012-01-20 DIAGNOSIS — Z0000 Encounter for general adult medical examination without abnormal findings: Secondary | ICD-10-CM | POA: Diagnosis not present

## 2012-01-20 DIAGNOSIS — J441 Chronic obstructive pulmonary disease with (acute) exacerbation: Secondary | ICD-10-CM | POA: Diagnosis not present

## 2012-01-20 DIAGNOSIS — H908 Mixed conductive and sensorineural hearing loss, unspecified: Secondary | ICD-10-CM | POA: Diagnosis not present

## 2012-01-20 DIAGNOSIS — I10 Essential (primary) hypertension: Secondary | ICD-10-CM | POA: Diagnosis not present

## 2012-01-20 DIAGNOSIS — I2510 Atherosclerotic heart disease of native coronary artery without angina pectoris: Secondary | ICD-10-CM | POA: Diagnosis not present

## 2012-01-25 ENCOUNTER — Ambulatory Visit (INDEPENDENT_AMBULATORY_CARE_PROVIDER_SITE_OTHER): Payer: Medicare Other | Admitting: Internal Medicine

## 2012-01-25 ENCOUNTER — Encounter: Payer: Self-pay | Admitting: Internal Medicine

## 2012-01-25 VITALS — BP 148/89 | HR 83 | Resp 16 | Ht 68.0 in | Wt 169.0 lb

## 2012-01-25 DIAGNOSIS — I10 Essential (primary) hypertension: Secondary | ICD-10-CM

## 2012-01-25 DIAGNOSIS — I4949 Other premature depolarization: Secondary | ICD-10-CM

## 2012-01-25 DIAGNOSIS — I493 Ventricular premature depolarization: Secondary | ICD-10-CM

## 2012-01-25 NOTE — Patient Instructions (Addendum)
Your physician has requested that you have an echocardiogram. Echocardiography is a painless test that uses sound waves to create images of your heart. It provides your doctor with information about the size and shape of your heart and how well your heart's chambers and valves are working. This procedure takes approximately one hour. There are no restrictions for this procedure.  Your physician has recommended that you wear a holter monitor. Holter monitors are medical devices that record the heart's electrical activity. Doctors most often use these monitors to diagnose arrhythmias. Arrhythmias are problems with the speed or rhythm of the heartbeat. The monitor is a small, portable device. You can wear one while you do your normal daily activities. This is usually used to diagnose what is causing palpitations/syncope (passing out).

## 2012-01-25 NOTE — Progress Notes (Addendum)
HPI  Patinet is an 78 year old iwht a history of HTN, HL significant COPD and CAD  Last cath in 2006.   Stress test in 2010 Normal (stress echo) Patient denies CP  Does say he is more short winded with activity. NO PND  No edema.No presyncope   Allergies  Allergen Reactions  . Sulfa Antibiotics     Current Outpatient Prescriptions  Medication Sig Dispense Refill  . albuterol (PROVENTIL HFA;VENTOLIN HFA) 108 (90 BASE) MCG/ACT inhaler Inhale 2 puffs into the lungs every 6 (six) hours as needed. For shortness of breath      . arformoterol (BROVANA) 15 MCG/2ML NEBU Take 15 mcg by nebulization 2 (two) times daily.      Marland Kitchen aspirin EC 81 MG tablet Take 81 mg by mouth daily.      Marland Kitchen atorvastatin (LIPITOR) 80 MG tablet Take 40 mg by mouth every other day.      . budesonide (PULMICORT) 0.5 MG/2ML nebulizer solution Take 0.5 mg by nebulization 2 (two) times daily.      . calcium-vitamin D (OSCAL WITH D) 500-200 MG-UNIT per tablet Take 1 tablet by mouth daily.      Marland Kitchen doxazosin (CARDURA) 2 MG tablet Take 2 mg by mouth daily.      . famotidine (PEPCID) 20 MG tablet Take 20 mg by mouth at bedtime.      . hydrochlorothiazide (MICROZIDE) 12.5 MG capsule Take 12.5 mg by mouth daily.      . Multiple Vitamins-Minerals (MULTIVITAMINS THER. W/MINERALS) TABS Take 1 tablet by mouth daily.      Marland Kitchen omeprazole (PRILOSEC) 20 MG capsule Take 20 mg by mouth daily. 30-60 min before first meal        Past Medical History  Diagnosis Date  . COPD (chronic obstructive pulmonary disease)   . Asthma   . Shortness of breath   . Cancer   . Hypertension   . Recurrent upper respiratory infection (URI)   . Pneumonia   . H/O hiatal hernia     Past Surgical History  Procedure Date  . Colon surgery     No family history on file.  History   Social History  . Marital Status: Single    Spouse Name: N/A    Number of Children: N/A  . Years of Education: N/A   Occupational History  . Not on file.   Social History  Main Topics  . Smoking status: Former Smoker -- 0.5 packs/day for 25 years    Types: Cigarettes, Pipe, Cigars  . Smokeless tobacco: Former Systems developer    Quit date: 03/10/1971  . Alcohol Use: No  . Drug Use: No  . Sexually Active: No   Other Topics Concern  . Not on file   Social History Narrative   Lives with wife    Review of Systems:  All systems reviewed.  They are negative to the above problem except as previously stated.  Vital Signs: BP 148/89  Pulse 83  Resp 16  Ht 5\' 8"  (1.727 m)  Wt 169 lb (76.658 kg)  BMI 25.70 kg/m2  Physical Exam Patient isin  NAD HEENT:  Normocephalic, atraumatic. EOMI, PERRLA.  Neck: JVP is normal.  No bruits.  Lungs: clear to auscultation. No rales no wheezes.  Heart: Regular rate and rhythm. Normal S1, S2. No S3.   No significant murmurs. PMI not displaced.  Abdomen:  Supple, nontender. Normal bowel sounds. No masses. No hepatomegaly.  Extremities:   Good distal pulses throughout. No  lower extremity edema.  Musculoskeletal :moving all extremities.  Neuro:   alert and oriented x3.  CN II-XII grossly intact.  EKG  SR with occasional PVC  76 bpm.   Assessment and Plan:  1.  Dyspnea   Volume status looks good Will schedule for echo to evaluate LV function  2.  PVCs.  Set up for 24 hour holter monitor  Keep activities as tolerated.

## 2012-01-29 ENCOUNTER — Other Ambulatory Visit: Payer: Self-pay

## 2012-01-29 ENCOUNTER — Encounter (INDEPENDENT_AMBULATORY_CARE_PROVIDER_SITE_OTHER): Payer: Medicare Other

## 2012-01-29 ENCOUNTER — Ambulatory Visit (HOSPITAL_COMMUNITY): Payer: Medicare Other | Attending: Internal Medicine | Admitting: Radiology

## 2012-01-29 DIAGNOSIS — R0609 Other forms of dyspnea: Secondary | ICD-10-CM | POA: Insufficient documentation

## 2012-01-29 DIAGNOSIS — I4949 Other premature depolarization: Secondary | ICD-10-CM

## 2012-01-29 DIAGNOSIS — I2510 Atherosclerotic heart disease of native coronary artery without angina pectoris: Secondary | ICD-10-CM | POA: Diagnosis not present

## 2012-01-29 DIAGNOSIS — R0989 Other specified symptoms and signs involving the circulatory and respiratory systems: Secondary | ICD-10-CM | POA: Diagnosis not present

## 2012-01-29 DIAGNOSIS — E785 Hyperlipidemia, unspecified: Secondary | ICD-10-CM | POA: Insufficient documentation

## 2012-01-29 DIAGNOSIS — I493 Ventricular premature depolarization: Secondary | ICD-10-CM

## 2012-01-29 DIAGNOSIS — I10 Essential (primary) hypertension: Secondary | ICD-10-CM | POA: Diagnosis not present

## 2012-01-29 DIAGNOSIS — J449 Chronic obstructive pulmonary disease, unspecified: Secondary | ICD-10-CM | POA: Insufficient documentation

## 2012-01-29 DIAGNOSIS — J4489 Other specified chronic obstructive pulmonary disease: Secondary | ICD-10-CM | POA: Insufficient documentation

## 2012-01-29 NOTE — Progress Notes (Signed)
Patient ID: Trevor Pitts, male   DOB: December 22, 1924, 78 y.o.   MRN: [id number] Place a 24 hr monitor on patient on 01/29/12 and also let him know when to take it off and to return it to the office on Monday 02/01/12

## 2012-01-29 NOTE — Progress Notes (Signed)
Echocardiogram performed.  

## 2012-02-17 ENCOUNTER — Ambulatory Visit: Payer: Medicare Other | Admitting: Cardiology

## 2012-02-19 ENCOUNTER — Telehealth: Payer: Self-pay | Admitting: Internal Medicine

## 2012-02-19 MED ORDER — METOPROLOL SUCCINATE ER 25 MG PO TB24: 25.0000 mg | ORAL_TABLET | Freq: Every day | ORAL | Status: DC

## 2012-02-19 NOTE — Telephone Encounter (Signed)
Patient aware to start Toprol. He will call back in about 2 weeks to let us know how he is feeling.

## 2012-02-19 NOTE — Telephone Encounter (Signed)
Pt would like results of monitor he wore a couple weeks ago

## 2012-02-19 NOTE — Telephone Encounter (Signed)
LMOM for C/B. Holter monitor from 01/29/2012 showed frequent PVC's,couplets, and triplets and occasional PAC's.  Dr.Ross recommended starting Toprol XL 25mg  every day and to call back in about 2 weeks with response to medication.

## 2012-04-06 DIAGNOSIS — C61 Malignant neoplasm of prostate: Secondary | ICD-10-CM | POA: Diagnosis not present

## 2012-06-10 DIAGNOSIS — H2510 Age-related nuclear cataract, unspecified eye: Secondary | ICD-10-CM | POA: Diagnosis not present

## 2012-07-26 DIAGNOSIS — I2510 Atherosclerotic heart disease of native coronary artery without angina pectoris: Secondary | ICD-10-CM | POA: Diagnosis not present

## 2012-07-26 DIAGNOSIS — J441 Chronic obstructive pulmonary disease with (acute) exacerbation: Secondary | ICD-10-CM | POA: Diagnosis not present

## 2012-07-26 DIAGNOSIS — I10 Essential (primary) hypertension: Secondary | ICD-10-CM | POA: Diagnosis not present

## 2012-07-26 DIAGNOSIS — M810 Age-related osteoporosis without current pathological fracture: Secondary | ICD-10-CM | POA: Diagnosis not present

## 2012-07-27 ENCOUNTER — Ambulatory Visit (INDEPENDENT_AMBULATORY_CARE_PROVIDER_SITE_OTHER): Payer: Medicare Other | Admitting: Internal Medicine

## 2012-07-27 ENCOUNTER — Encounter: Payer: Self-pay | Admitting: Internal Medicine

## 2012-07-27 VITALS — BP 142/74 | HR 58 | Temp 97.1°F | Ht 67.5 in | Wt 173.6 lb

## 2012-07-27 DIAGNOSIS — J449 Chronic obstructive pulmonary disease, unspecified: Secondary | ICD-10-CM | POA: Diagnosis not present

## 2012-07-27 NOTE — Patient Instructions (Addendum)
No change in your pulmonary medications   If you are satisfied with your treatment plan let your doctor know and he/she can either refill your medications or you can return here when your prescription runs out.     If in any way you are not 100% satisfied,  please tell us.  If 100% better, tell your friends!   Pulmonary follow up is as needed

## 2012-07-27 NOTE — Progress Notes (Signed)
Subjective:     Patient ID: Trevor Pitts, male   DOB: Jun 10, 1924  MRN: [id number]    Brief patient profile:   87  yowm quit smoking around 1970 due to Vocal cord problems per ENT but no residual symptoms but GOLD III documented 03/2009.  HPI  February 13, 2009 cc variable sob x 10 years with PFT's at [facility] but doesn't remember when gradually worse to point where  needs to go slow and wear 02. Also cough more daytime clear mucus with occasional sensation of throat closing up, mucus getting stuck at level of suprasternal notch and choking him. rec stop benazepril  start benicar 40 mg one daily  Stop fish oil until no longer coughing, then ok to take it again    Admit date: 03/22/2011  Discharge date: 03/25/2011  Primary Care Physician: Trevor Seashore, MD, MD  Pulmonologist: Trevor Gully, MD  Discharge Diagnoses:  1-COPD exacerbation 2-CAP (community acquired pneumonia) 3-HYPERLIPIDEMIA 4-HYPERTENSION 5-Dysphagia  6-GERD   04/16/2011 f/u ov/Trevor Pitts cc no doe as long as wear 2lpm  X  [place] [employer] [facility]. Doesn't sleep on 02 .  No cough.  Over using daytime saba and does not really understand maint vs prn rx  rec Plan A is to use your brovana and budesonide perfectly regularly Plan B is to use your albuterol inhaler 2 puffs every 4 hours if needed when you can't catch your breath for any reason Plan C is to use your albuterol nebulizer if you still can't catch your breath but if you start needing this you need to call to see me or my nurse practioner Trevor Pitts Work on inhaler technique    07/22/2011 f/u ov/Trevor Pitts cc  27mo f/u- breathing has been doing well, little sob. Pt states he still wears 2LM O2 with exertion and QHS.  - best he's done in years, still not able to use hfa (see imp/plan) no cough, no overt sinus or hb symptoms. rec Work on hfa   07/27/2012 f/u ov/Trevor Pitts re COPD on  brovana/budesonide  Chief Complaint  Patient presents with  . Follow-up    Pt states  overall breathing is still doing the same. No new co's today.   still doe on 02 prn,  Doesn't really perceive he needs it.    No obvious daytime variabilty or assoc chronic cough or cp or chest tightness, subjective wheeze overt sinus or hb symptoms. No unusual exp hx or h/o childhood pna/ asthma or premature birth to his knowledge.    Sleeping ok without nocturnal  or early am exacerbation  of respiratory  c/o's or need for noct saba. Also denies any obvious fluctuation of symptoms with weather or environmental changes or other aggravating or alleviating factors except as outlined above   Current Medications, Allergies, Past Medical History,   Family History, and Social History were reviewed in [employer] record.  ROS  The following are not active complaints unless bolded sore throat, dysphagia, dental problems, itching, sneezing,  nasal congestion or excess/ purulent secretions, ear ache,   fever, chills, sweats, unintended wt loss, pleuritic or exertional cp, hemoptysis,  orthopnea pnd or leg swelling, presyncope, palpitations, heartburn, abdominal pain, anorexia, nausea, vomiting, diarrhea  or change in bowel or urinary habits, change in stools or urine, dysuria,hematuria,  rash, arthralgias, visual complaints, headache, numbness weakness or ataxia or problems with walking or coordination,  change in mood/affect or memory.          Past Medical History:  C O P D.........................................................Marland KitchenWert  - PFT's March 15, 2009 FEV1 1.05 (44%) ratio 39 with 14% better after B2 and DLC048%  Hyperlipidemia  Hypertension       Objective:   Physical Exam  Wt 174 February 13, 2009 >  > 177 March 30, 2010 > 177 04/16/2011 > 07/22/2011  176> 07/27/12  173 In general robust pleasant amb wm nad  HEENT mild turbinate edema. Oropharynx no thrush or excess pnd or cobblestoning. No JVD or cervical adenopathy. Mild accessory muscle hypertrophy. Trachea  midline, nl thryroid. Chest was hyperinflated by percussion with diminished breath sounds and moderate increased exp time without wheeze. Hoover sign positive at mid inspiration. Regular rate and rhythm without murmur gallop or rub or increase P2 or edema. Abd: no hsm, nl excursion. Ext warm without cyanosis or clubbing.  Last CXR  04/16/2011  Reviewed:  Comparison: PA and lateral chest 03/22/2011, 12/17/2010, 06/12/2009 and 05/01/2009.  Findings: Lungs are emphysematous with scattered calcified granulomata. No focal airspace disease or effusion. Heart size is normal.  IMPRESSION: No acute finding   Assessment:         Plan:

## 2012-07-28 NOTE — Assessment & Plan Note (Signed)
-   PFT's March 15, 2009 FEV1 1.05 (44%) ratio 39 with 14% better after B2 and DLC048%   - HFA 25% p coaching 07/22/2011   Although severe, he's well compensated on laba/ics     Each maintenance medication was reviewed in detail including most importantly the difference between maintenance and as needed and under what circumstances the prns are to be used.  Please see instructions for details which were reviewed in writing and the patient given a copy.

## 2012-08-02 DIAGNOSIS — E785 Hyperlipidemia, unspecified: Secondary | ICD-10-CM | POA: Diagnosis not present

## 2012-08-02 DIAGNOSIS — I10 Essential (primary) hypertension: Secondary | ICD-10-CM | POA: Diagnosis not present

## 2012-08-02 DIAGNOSIS — J441 Chronic obstructive pulmonary disease with (acute) exacerbation: Secondary | ICD-10-CM | POA: Diagnosis not present

## 2012-08-02 DIAGNOSIS — I2510 Atherosclerotic heart disease of native coronary artery without angina pectoris: Secondary | ICD-10-CM | POA: Diagnosis not present

## 2012-08-15 DIAGNOSIS — H25049 Posterior subcapsular polar age-related cataract, unspecified eye: Secondary | ICD-10-CM | POA: Diagnosis not present

## 2012-08-15 DIAGNOSIS — H3530 Unspecified macular degeneration: Secondary | ICD-10-CM | POA: Diagnosis not present

## 2012-08-15 DIAGNOSIS — H2510 Age-related nuclear cataract, unspecified eye: Secondary | ICD-10-CM | POA: Diagnosis not present

## 2012-08-15 DIAGNOSIS — Z961 Presence of intraocular lens: Secondary | ICD-10-CM | POA: Diagnosis not present

## 2012-08-15 DIAGNOSIS — H25019 Cortical age-related cataract, unspecified eye: Secondary | ICD-10-CM | POA: Diagnosis not present

## 2012-08-19 ENCOUNTER — Ambulatory Visit (INDEPENDENT_AMBULATORY_CARE_PROVIDER_SITE_OTHER): Payer: Medicare Other | Admitting: Internal Medicine

## 2012-08-19 ENCOUNTER — Encounter: Payer: Self-pay | Admitting: Internal Medicine

## 2012-08-19 VITALS — BP 120/82 | HR 90 | Temp 97.9°F | Ht 68.0 in | Wt 173.0 lb

## 2012-08-19 DIAGNOSIS — J449 Chronic obstructive pulmonary disease, unspecified: Secondary | ICD-10-CM | POA: Diagnosis not present

## 2012-08-19 DIAGNOSIS — J9610 Chronic respiratory failure, unspecified whether with hypoxia or hypercapnia: Secondary | ICD-10-CM

## 2012-08-19 MED ORDER — PREDNISONE (PAK) 10 MG PO TABS: ORAL_TABLET | ORAL | Status: DC

## 2012-08-19 MED ORDER — AZITHROMYCIN 250 MG PO TABS: ORAL_TABLET | ORAL | Status: DC

## 2012-08-19 NOTE — Progress Notes (Signed)
Subjective:     Patient ID: Trevor Pitts, male   DOB: September 17, 1924  MRN: [id number]    Brief patient profile:   87  yowm quit smoking around 1970 due to Vocal cord problems per ENT but no residual symptoms but GOLD III documented 03/2009.  HPI  February 13, 2009 cc variable sob x 10 years with PFT's at [facility] but doesn't remember when gradually worse to point where  needs to go slow and wear 02. Also cough more daytime clear mucus with occasional sensation of throat closing up, mucus getting stuck at level of suprasternal notch and choking him. rec stop benazepril  start benicar 40 mg one daily  Stop fish oil until no longer coughing, then ok to take it again    Admit date: 03/22/2011  Discharge date: 03/25/2011  Primary Care Physician: Merrilee Seashore, MD, MD  Pulmonologist: Christinia Gully, MD  Discharge Diagnoses:  1-COPD exacerbation 2-CAP (community acquired pneumonia) 3-HYPERLIPIDEMIA 4-HYPERTENSION 5-Dysphagia  6-GERD   04/16/2011 f/u ov/Jayesh Marbach cc no doe as long as wear 2lpm  X  [place] [employer] [place]. Doesn't sleep on 02 .  No cough.  Over using daytime saba and does not really understand maint vs prn rx  rec Plan A is to use your brovana and budesonide perfectly regularly Plan B is to use your albuterol inhaler 2 puffs every 4 hours if needed when you can't catch your breath for any reason Plan C is to use your albuterol nebulizer if you still can't catch your breath but if you start needing this you need to call to see me or my nurse practioner [place] Work on inhaler technique    07/22/2011 f/u ov/Maclaine Ahola cc  9mo f/u- breathing has been doing well, little sob. Pt states he still wears 2LM O2 with exertion and QHS.  - best he's done in years, still not able to use hfa (see imp/plan) no cough, no overt sinus or hb symptoms. rec Work on hfa   07/27/2012 f/u ov/Melisa Donofrio re COPD on  brovana/budesonide  Chief Complaint  Patient presents with  . Follow-up    Pt states  overall breathing is still doing the same. No new co's today.   still doe on 02 prn,  Doesn't really perceive he needs it.   rec No change rx, f/u prn   08/19/2012 acute ov/Jack Bolio Chief Complaint  Patient presents with  . Acute Visit    Pt c/o increased cough, throat irritation and SOB for the past 2 days. Cough is prod with minimal thick, white sputum.    scratchy throat, no purulent sputum, not using rescue saba at baseline and  helps some since flare with sob but not cough.   No obvious daytime variabilty or  cp or chest tightness, subjective wheeze overt sinus or hb symptoms. No unusual exp hx or h/o childhood pna/ asthma or premature birth to his knowledge.    Sleeping ok without nocturnal  or early am exacerbation  of respiratory  c/o's or need for noct saba. Also denies any obvious fluctuation of symptoms with weather or environmental changes or other aggravating or alleviating factors except as outlined above   Current Medications, Allergies, Complete Past Medical History,   Family History, and Social History were reviewed in [employer] record.  ROS  The following are not active complaints unless bolded sore throat, dysphagia, dental problems, itching, sneezing,  nasal congestion or excess/ purulent secretions, ear ache,   fever, chills, sweats, unintended wt  loss, pleuritic or exertional cp, hemoptysis,  orthopnea pnd or leg swelling, presyncope, palpitations, heartburn, abdominal pain, anorexia, nausea, vomiting, diarrhea  or change in bowel or urinary habits, change in stools or urine, dysuria,hematuria,  rash, arthralgias, visual complaints, headache, numbness weakness or ataxia or problems with walking or coordination,  change in mood/affect or memory.          Past Medical History:  C O P D.........................................................Marland KitchenWert  - PFT's March 15, 2009 FEV1 1.05 (44%) ratio 39 with 14% better after B2 and DLC048%  Hyperlipidemia   Hypertension       Objective:   Physical Exam  Wt 174 February 13, 2009 >  > 177 March 30, 2010 > 177 04/16/2011 > 07/22/2011  176> 07/27/12  173 > 08/19/2012 173  In general robust pleasant amb wm nad  HEENT mild turbinate edema. Oropharynx no thrush or excess pnd or cobblestoning. No JVD or cervical adenopathy. Mild accessory muscle hypertrophy. Trachea midline, nl thryroid. Chest was hyperinflated by percussion with diminished breath sounds and moderate increased exp time without wheeze. Hoover sign positive at mid inspiration. Regular rate and rhythm without murmur gallop or rub or increase P2 or edema. Abd: no hsm, nl excursion. Ext warm without cyanosis or clubbing.  Last CXR  04/16/2011  Reviewed:  Comparison: PA and lateral chest 03/22/2011, 12/17/2010, 06/12/2009 and 05/01/2009.  Findings: Lungs are emphysematous with scattered calcified granulomata. No focal airspace disease or effusion. Heart size is normal.  IMPRESSION: No acute finding   Assessment:         Plan:

## 2012-08-19 NOTE — Patient Instructions (Addendum)
Try prilosec 20mg   Take 30-60 min before first meal of the day and Pepcid 20 mg one bedtime until cough is completely gone for at least a week without the need for cough suppression   zpak and prednisone called in  Only use your albuterol(ventolin) as a rescue medication to be used if you can't catch your breath by resting or doing a relaxed purse lip breathing pattern. The less you use it, the better it will work when you need it.  Ok to use up to 2 puffs every 4 hours if needed    If you are satisfied with your treatment plan let your doctor know and he/she can either refill your medications or you can return here when your prescription runs out.     If in any way you are not 100% satisfied,  please tell us.  If 100% better, tell your friends!

## 2012-08-21 DIAGNOSIS — J9611 Chronic respiratory failure with hypoxia: Secondary | ICD-10-CM | POA: Insufficient documentation

## 2012-08-21 NOTE — Assessment & Plan Note (Signed)
Baseline rx 2lpm with exertion and at hs.  Ok also to use prn daytime at rest if any sob during flares.

## 2012-08-21 NOTE — Assessment & Plan Note (Signed)
-   PFT's March 15, 2009 FEV1 1.05 (44%) ratio 39 with 14% better after B2 and DLC048%   Mild flare in setting of apparent uri.  The proper method of use, as well as anticipated side effects, of a metered-dose inhaler are discussed and demonstrated to the patient. Improved effectiveness after extensive coaching during this visit to a level of approximately  75% so ok to continue prn saba, reviewed.  Explained natural history of uri and why it's necessary in patients at risk to treat GERD aggressively  at least  short term   to reduce risk of evolving cyclical cough initially  triggered by epithelial injury and a heightened sensitivty to the effects of any upper airway irritants,  most importantly acid - related.  That is, the more sensitive the epithelium damaged for virus, the more the cough, the more the secondary reflux (especially in those prone to reflux) the more the irritation of the sensitive mucosa and so on in a cyclical pattern.   rx zpak and prednisone x 6 days     Each maintenance medication was reviewed in detail including most importantly the difference between maintenance and as needed and under what circumstances the prns are to be used.  Please see instructions for details which were reviewed in writing and the patient given a copy.

## 2012-08-26 DIAGNOSIS — I10 Essential (primary) hypertension: Secondary | ICD-10-CM | POA: Diagnosis not present

## 2012-08-26 DIAGNOSIS — E785 Hyperlipidemia, unspecified: Secondary | ICD-10-CM | POA: Diagnosis not present

## 2012-08-26 DIAGNOSIS — I4949 Other premature depolarization: Secondary | ICD-10-CM | POA: Diagnosis not present

## 2012-09-26 DIAGNOSIS — H2510 Age-related nuclear cataract, unspecified eye: Secondary | ICD-10-CM | POA: Diagnosis not present

## 2012-09-26 DIAGNOSIS — H269 Unspecified cataract: Secondary | ICD-10-CM | POA: Diagnosis not present

## 2012-10-05 DIAGNOSIS — C61 Malignant neoplasm of prostate: Secondary | ICD-10-CM | POA: Diagnosis not present

## 2012-11-16 DIAGNOSIS — H43819 Vitreous degeneration, unspecified eye: Secondary | ICD-10-CM | POA: Diagnosis not present

## 2012-11-16 DIAGNOSIS — H35039 Hypertensive retinopathy, unspecified eye: Secondary | ICD-10-CM | POA: Diagnosis not present

## 2012-11-16 DIAGNOSIS — H3560 Retinal hemorrhage, unspecified eye: Secondary | ICD-10-CM | POA: Diagnosis not present

## 2012-11-16 DIAGNOSIS — H353190 Nonexudative age-related macular degeneration, unspecified eye, stage unspecified: Secondary | ICD-10-CM | POA: Diagnosis not present

## 2013-01-02 DIAGNOSIS — R0601 Orthopnea: Secondary | ICD-10-CM | POA: Diagnosis not present

## 2013-01-02 DIAGNOSIS — I2510 Atherosclerotic heart disease of native coronary artery without angina pectoris: Secondary | ICD-10-CM | POA: Diagnosis not present

## 2013-01-02 DIAGNOSIS — R0602 Shortness of breath: Secondary | ICD-10-CM | POA: Diagnosis not present

## 2013-01-02 DIAGNOSIS — J449 Chronic obstructive pulmonary disease, unspecified: Secondary | ICD-10-CM | POA: Diagnosis not present

## 2013-01-05 DIAGNOSIS — I10 Essential (primary) hypertension: Secondary | ICD-10-CM | POA: Diagnosis not present

## 2013-01-05 DIAGNOSIS — J441 Chronic obstructive pulmonary disease with (acute) exacerbation: Secondary | ICD-10-CM | POA: Diagnosis not present

## 2013-01-05 DIAGNOSIS — J9610 Chronic respiratory failure, unspecified whether with hypoxia or hypercapnia: Secondary | ICD-10-CM | POA: Diagnosis not present

## 2013-01-12 DIAGNOSIS — Z23 Encounter for immunization: Secondary | ICD-10-CM | POA: Diagnosis not present

## 2013-02-01 DIAGNOSIS — E785 Hyperlipidemia, unspecified: Secondary | ICD-10-CM | POA: Diagnosis not present

## 2013-02-01 DIAGNOSIS — I4949 Other premature depolarization: Secondary | ICD-10-CM | POA: Diagnosis not present

## 2013-02-01 DIAGNOSIS — I10 Essential (primary) hypertension: Secondary | ICD-10-CM | POA: Diagnosis not present

## 2013-02-01 DIAGNOSIS — I2510 Atherosclerotic heart disease of native coronary artery without angina pectoris: Secondary | ICD-10-CM | POA: Diagnosis not present

## 2013-02-01 DIAGNOSIS — Z0000 Encounter for general adult medical examination without abnormal findings: Secondary | ICD-10-CM | POA: Diagnosis not present

## 2013-02-01 DIAGNOSIS — Z1331 Encounter for screening for depression: Secondary | ICD-10-CM | POA: Diagnosis not present

## 2013-02-01 DIAGNOSIS — J441 Chronic obstructive pulmonary disease with (acute) exacerbation: Secondary | ICD-10-CM | POA: Diagnosis not present

## 2013-02-08 DIAGNOSIS — I2510 Atherosclerotic heart disease of native coronary artery without angina pectoris: Secondary | ICD-10-CM | POA: Diagnosis not present

## 2013-02-08 DIAGNOSIS — I10 Essential (primary) hypertension: Secondary | ICD-10-CM | POA: Diagnosis not present

## 2013-02-08 DIAGNOSIS — E785 Hyperlipidemia, unspecified: Secondary | ICD-10-CM | POA: Diagnosis not present

## 2013-02-08 DIAGNOSIS — I4949 Other premature depolarization: Secondary | ICD-10-CM | POA: Diagnosis not present

## 2013-02-15 DIAGNOSIS — H353190 Nonexudative age-related macular degeneration, unspecified eye, stage unspecified: Secondary | ICD-10-CM | POA: Diagnosis not present

## 2013-03-15 DIAGNOSIS — J441 Chronic obstructive pulmonary disease with (acute) exacerbation: Secondary | ICD-10-CM | POA: Diagnosis not present

## 2013-03-15 DIAGNOSIS — I10 Essential (primary) hypertension: Secondary | ICD-10-CM | POA: Diagnosis not present

## 2013-03-15 DIAGNOSIS — R062 Wheezing: Secondary | ICD-10-CM | POA: Diagnosis not present

## 2013-03-15 DIAGNOSIS — R0602 Shortness of breath: Secondary | ICD-10-CM | POA: Diagnosis not present

## 2013-04-11 DIAGNOSIS — C61 Malignant neoplasm of prostate: Secondary | ICD-10-CM | POA: Diagnosis not present

## 2013-04-28 ENCOUNTER — Ambulatory Visit (INDEPENDENT_AMBULATORY_CARE_PROVIDER_SITE_OTHER)
Admission: RE | Admit: 2013-04-28 | Discharge: 2013-04-28 | Disposition: A | Discharge status: Home / Self Care | Payer: Medicare Other | Source: Ambulatory Visit | Attending: Adult Health | Admitting: Adult Health

## 2013-04-28 ENCOUNTER — Encounter: Payer: Self-pay | Admitting: Adult Health

## 2013-04-28 ENCOUNTER — Telehealth: Payer: Self-pay | Admitting: Internal Medicine

## 2013-04-28 ENCOUNTER — Ambulatory Visit (INDEPENDENT_AMBULATORY_CARE_PROVIDER_SITE_OTHER): Payer: Medicare Other | Admitting: Adult Health

## 2013-04-28 VITALS — BP 138/88 | HR 66 | Temp 97.9°F | Ht 68.0 in | Wt 173.0 lb

## 2013-04-28 DIAGNOSIS — J441 Chronic obstructive pulmonary disease with (acute) exacerbation: Secondary | ICD-10-CM | POA: Diagnosis not present

## 2013-04-28 DIAGNOSIS — J449 Chronic obstructive pulmonary disease, unspecified: Secondary | ICD-10-CM | POA: Diagnosis not present

## 2013-04-28 DIAGNOSIS — J4489 Other specified chronic obstructive pulmonary disease: Secondary | ICD-10-CM | POA: Diagnosis not present

## 2013-04-28 DIAGNOSIS — J438 Other emphysema: Secondary | ICD-10-CM | POA: Diagnosis not present

## 2013-04-28 MED ORDER — LEVALBUTEROL HCL 0.63 MG/3ML IN NEBU
0.6300 mg | INHALATION_SOLUTION | Freq: Once | RESPIRATORY_TRACT | Status: AC
  Administered 2013-04-28: 0.63 mg via RESPIRATORY_TRACT

## 2013-04-28 NOTE — Patient Instructions (Addendum)
Prednisone taper. Over the next week Mucinex DM twice daily as needed. For cough and congestion. Continue on Budesonide and Brovana Neb Twice daily   May use albuterol inhaler as needed . For wheezing and shortness of breath Please contact office for sooner follow up if symptoms do not improve or worsen or seek emergency care  Follow up Dr. [name]  In 6 weeks and As needed    Late add :  Levaquin x 7 d  Ov with cxr in 2 weeks

## 2013-04-28 NOTE — Telephone Encounter (Signed)
Advised pt per TP saw PNA and place on Levaquin x7 days along with pred. Pt aware and will pick these meds up and get appt to f/u with MW for 3 weeks Nothing further needed

## 2013-04-28 NOTE — Progress Notes (Signed)
Subjective:     Patient ID: Trevor Pitts, male   DOB: 06-20-1924  MRN: [id number] Brief patient profile:   88  yowm quit smoking around 1970 due to Vocal cord problems per ENT but no residual symptoms but GOLD III documented 03/2009.  HPI  February 13, 2009 cc variable sob x 10 years with PFT's at [facility] but doesn't remember when gradually worse to point where  needs to go slow and wear 02. Also cough more daytime clear mucus with occasional sensation of throat closing up, mucus getting stuck at level of suprasternal notch and choking him. rec stop benazepril  start benicar 40 mg one daily  Stop fish oil until no longer coughing, then ok to take it again    Admit date: 03/22/2011  Discharge date: 03/25/2011  Primary Care Physician: Merrilee Seashore, MD, MD  Pulmonologist: Christinia Gully, MD  Discharge Diagnoses:  1-COPD exacerbation 2-CAP (community acquired pneumonia) 3-HYPERLIPIDEMIA 4-HYPERTENSION 5-Dysphagia  6-GERD   04/16/2011 f/u ov/Wert cc no doe as long as wear 2lpm  X  [place] [employer] [place]. Doesn't sleep on 02 .  No cough.  Over using daytime saba and does not really understand maint vs prn rx  rec Plan A is to use your brovana and budesonide perfectly regularly Plan B is to use your albuterol inhaler 2 puffs every 4 hours if needed when you can't catch your breath for any reason Plan C is to use your albuterol nebulizer if you still can't catch your breath but if you start needing this you need to call to see me or my nurse practioner [place] Work on inhaler technique    07/22/2011 f/u ov/Wert cc  76mo f/u- breathing has been doing well, little sob. Pt states he still wears 2LM O2 with exertion and QHS.  - best he's done in years, still not able to use hfa (see imp/plan) no cough, no overt sinus or hb symptoms. rec Work on hfa   07/27/2012 f/u ov/Wert re COPD on  brovana/budesonide  Chief Complaint  Patient presents with  . Follow-up    Pt states  overall breathing is still doing the same. No new co's today.   still doe on 02 prn,  Doesn't really perceive he needs it.   rec No change rx, f/u prn   08/19/2012 acute ov/Wert Chief Complaint  Patient presents with  . Acute Visit    Pt c/o increased cough, throat irritation and SOB for the past 2 days. Cough is prod with minimal thick, white sputum.    scratchy throat, no purulent sputum, not using rescue saba at baseline and  helps some since flare with sob but not cough. >>zpack /pred   04/28/2013 Acute OV  Complains of increased sob and wheezing since x2 days. No chest pain, cough, congestion , fever, leg swelling or orthopnea .  On Brovana/pulmicort neb Twice daily  .  No recent travel or abx use.  No increased SABA use.  Feels wheezy and tight in chest .     Current Medications, Allergies, Complete Past Medical History,   Family History, and Social History were reviewed in [employer] record.  ROS  The following are not active complaints unless bolded sore throat, dysphagia, dental problems, itching, sneezing,  nasal congestion or excess/ purulent secretions, ear ache,   fever, chills, sweats, unintended wt loss, pleuritic or exertional cp, hemoptysis,  orthopnea pnd or leg swelling, presyncope, palpitations, heartburn, abdominal pain, anorexia, nausea, vomiting, diarrhea  or change in bowel or urinary habits, change in stools or urine, dysuria,hematuria,  rash, arthralgias, visual complaints, headache, numbness weakness or ataxia or problems with walking or coordination,  change in mood/affect or memory.          Past Medical History:  C O P D.........................................................Marland KitchenWert  - PFT's March 15, 2009 FEV1 1.05 (44%) ratio 39 with 14% better after B2 and DLC048%  Hyperlipidemia  Hypertension       Objective:   Physical Exam  Wt 174 February 13, 2009 >  > 177 March 30, 2010 > 177 04/16/2011 > 07/22/2011  176> 07/27/12   173 > 08/19/2012 173 >173 04/28/2013  In general robust pleasant amb wm nad  HEENT mild turbinate edema. Oropharynx no thrush or excess pnd or cobblestoning. No JVD or cervical adenopathy. Mild accessory muscle hypertrophy. Trachea midline, nl thryroid. Chest  W/ exp wheezing bilaterally, no stridor , speaks in full sentences w/out difficulty.  Regular rate and rhythm without murmur gallop or rub or increase P2 or edema. Abd: no hsm, nl excursion. Ext warm without cyanosis or clubbing.  Last CXR  04/16/2011  Reviewed:  Comparison: PA and lateral chest 03/22/2011, 12/17/2010, 06/12/2009 and 05/01/2009.  Findings: Lungs are emphysematous with scattered calcified granulomata. No focal airspace disease or effusion. Heart size is normal.  IMPRESSION: No acute finding   Assessment:         Plan:

## 2013-04-28 NOTE — Assessment & Plan Note (Addendum)
Exacerbation  Xopenex neb x 1  Check cxr today   Plan  Prednisone taper. Over the next week Mucinex DM twice daily as needed. For cough and congestion. Continue on Budesonide and Brovana Neb Twice daily   May use albuterol inhaler as needed . For wheezing and shortness of breath Please contact office for sooner follow up if symptoms do not improve or worsen or seek emergency care  Follow up Dr. [name]  In 6 weeks and As needed

## 2013-05-17 ENCOUNTER — Encounter: Payer: Self-pay | Admitting: Internal Medicine

## 2013-05-17 ENCOUNTER — Ambulatory Visit (INDEPENDENT_AMBULATORY_CARE_PROVIDER_SITE_OTHER)
Admission: RE | Admit: 2013-05-17 | Discharge: 2013-05-17 | Disposition: A | Discharge status: Home / Self Care | Payer: Medicare Other | Source: Ambulatory Visit | Attending: Internal Medicine | Admitting: Internal Medicine

## 2013-05-17 ENCOUNTER — Ambulatory Visit (INDEPENDENT_AMBULATORY_CARE_PROVIDER_SITE_OTHER): Payer: Medicare Other | Admitting: Internal Medicine

## 2013-05-17 VITALS — BP 140/70 | HR 62 | Temp 97.9°F | Ht 68.0 in | Wt 171.0 lb

## 2013-05-17 DIAGNOSIS — J449 Chronic obstructive pulmonary disease, unspecified: Secondary | ICD-10-CM

## 2013-05-17 DIAGNOSIS — J9610 Chronic respiratory failure, unspecified whether with hypoxia or hypercapnia: Secondary | ICD-10-CM

## 2013-05-17 DIAGNOSIS — J189 Pneumonia, unspecified organism: Secondary | ICD-10-CM | POA: Diagnosis not present

## 2013-05-17 NOTE — Patient Instructions (Addendum)
No change in medications or oxygen      Please schedule a follow up visit in 3 months but call sooner if needed

## 2013-05-17 NOTE — Assessment & Plan Note (Signed)
Resolved by cxr 04/16/11 in pulmonary clinic 04/28/2013 RML Opacity c/w PNA >levaquin x 7 d >cxr 05/17/2013 no acute changes  Very difficult to distinguish pna in this is setting but clinically much better at this point p rx as CAP

## 2013-05-17 NOTE — Assessment & Plan Note (Signed)
-   PFT's March 15, 2009 FEV1 1.05 (44%) ratio 39 with 14% better after B2 and DLC048%   - HFA  75% 08/19/2012   Adequate control on present rx, reviewed > no change in rx needed      Each maintenance medication was reviewed in detail including most importantly the difference between maintenance and as needed and under what circumstances the prns are to be used.  Please see instructions for details which were reviewed in writing and the patient given a copy.

## 2013-05-17 NOTE — Assessment & Plan Note (Signed)
Baseline rx 2lpm with exertion and at hs.  Adequate control on present rx, reviewed > no change in rx needed

## 2013-05-17 NOTE — Progress Notes (Signed)
Subjective:     Patient ID: Trevor Pitts, male   DOB: 03-04-1925  MRN: [id number]   Brief patient profile:   88  yowm quit smoking around 1970 due to Vocal cord problems per ENT but no residual symptoms but GOLD III documented 03/2009.    History of Present Illness  February 13, 2009 cc variable sob x 10 years with PFT's at [facility] but doesn't remember when gradually worse to point where  needs to go slow and wear 02. Also cough more daytime clear mucus with occasional sensation of throat closing up, mucus getting stuck at level of suprasternal notch and choking him. rec stop benazepril  start benicar 40 mg one daily  Stop fish oil until no longer coughing, then ok to take it again    Admit date: 03/22/2011  Discharge date: 03/25/2011   Discharge Diagnoses:  1-COPD exacerbation 2-CAP (community acquired pneumonia) 3-HYPERLIPIDEMIA 4-HYPERTENSION 5-Dysphagia  6-GERD    04/28/2013 Acute OV  Complains of increased sob and wheezing  x2 days. No chest pain, cough, congestion , fever, leg swelling or orthopnea .  On Brovana/pulmicort neb Twice daily  .  No recent travel or abx use.  No increased SABA use.  Feels wheezy and tight in chest  rec Prednisone taper. Over the next week Mucinex DM twice daily as needed. For cough and congestion. Continue on Budesonide and Brovana Neb Twice daily   May use albuterol inhaler as needed . For wheezing and shortness of breath Late add :  Levaquin x 7 d    05/17/2013 f/u ov/Trevor Pitts re: 02 2lpm ex and sleeping / GOLD III COPD  Chief Complaint  Patient presents with  . Followup with CXR    Pt states his breathing is much improved since last visit, almost back at baseline for him.  He has not needed albuterol neb.    Not limited by breathing from desired activities  As long as wearing 02  No obvious day to day or daytime variabilty or assoc chronic cough or cp or chest tightness, subjective wheeze overt sinus or hb symptoms. No unusual exp hx or h/o  childhood pna/ asthma or knowledge of premature birth.  Sleeping ok without nocturnal  or early am exacerbation  of respiratory  c/o's or need for noct saba. Also denies any obvious fluctuation of symptoms with weather or environmental changes or other aggravating or alleviating factors except as outlined above   Current Medications, Allergies, Complete Past Medical History, Past Surgical History, Family History, and Social History were reviewed in [employer] record.  ROS  The following are not active complaints unless bolded sore throat, dysphagia, dental problems, itching, sneezing,  nasal congestion or excess/ purulent secretions, ear ache,   fever, chills, sweats, unintended wt loss, pleuritic or exertional cp, hemoptysis,  orthopnea pnd or leg swelling, presyncope, palpitations, heartburn, abdominal pain, anorexia, nausea, vomiting, diarrhea  or change in bowel or urinary habits, change in stools or urine, dysuria,hematuria,  rash, arthralgias, visual complaints, headache, numbness weakness or ataxia or problems with walking or coordination,  change in mood/affect or memory.         Past Medical History:  C O P D.........................................................Marland KitchenWert  - PFT's March 15, 2009 FEV1 1.05 (44%) ratio 39 with 14% better after B2 and DLC048%  Hyperlipidemia  Hypertension       Objective:   Physical Exam  Wt 174 February 13, 2009 >  > 177 March 30, 2010 > 177 04/16/2011 > 07/22/2011  176> 07/27/12  173 > 08/19/2012 173 >173 04/28/2013   In general robust pleasant amb wm nad  HEENT mild turbinate edema. Oropharynx no thrush or excess pnd or cobblestoning. No JVD or cervical adenopathy. Mild accessory muscle hypertrophy. Trachea midline, nl thryroid. Chest  With minimal exp rhonchi bilaterally   Regular rate and rhythm without murmur gallop or rub or increase P2 or edema. Abd: no hsm, nl excursion. Ext warm without cyanosis or clubbing.     CXR   05/17/2013 :  Findings consist with stable pulmonary interstitial fibrosis, pleural parenchymal scarring, old granulomatous disease, and COPD. No acute abnormality identified.     Assessment:

## 2013-05-18 NOTE — Progress Notes (Signed)
Quick Note:  Spoke with pt and notified of results per Dr. Wert. Pt verbalized understanding and denied any questions.  ______ 

## 2013-06-05 DIAGNOSIS — J441 Chronic obstructive pulmonary disease with (acute) exacerbation: Secondary | ICD-10-CM | POA: Diagnosis not present

## 2013-06-05 DIAGNOSIS — R062 Wheezing: Secondary | ICD-10-CM | POA: Diagnosis not present

## 2013-06-05 DIAGNOSIS — I2510 Atherosclerotic heart disease of native coronary artery without angina pectoris: Secondary | ICD-10-CM | POA: Diagnosis not present

## 2013-06-05 DIAGNOSIS — R079 Chest pain, unspecified: Secondary | ICD-10-CM | POA: Diagnosis not present

## 2013-06-08 DIAGNOSIS — R0602 Shortness of breath: Secondary | ICD-10-CM | POA: Diagnosis not present

## 2013-06-08 DIAGNOSIS — I2510 Atherosclerotic heart disease of native coronary artery without angina pectoris: Secondary | ICD-10-CM | POA: Diagnosis not present

## 2013-06-08 DIAGNOSIS — J441 Chronic obstructive pulmonary disease with (acute) exacerbation: Secondary | ICD-10-CM | POA: Diagnosis not present

## 2013-06-12 ENCOUNTER — Ambulatory Visit: Payer: Medicare Other | Admitting: Internal Medicine

## 2013-06-29 ENCOUNTER — Encounter (HOSPITAL_COMMUNITY): Payer: Self-pay | Admitting: Emergency Medicine

## 2013-06-29 ENCOUNTER — Emergency Department (HOSPITAL_COMMUNITY)
Admission: EM | Admit: 2013-06-29 | Discharge: 2013-06-29 | Disposition: A | Discharge status: Home / Self Care | Payer: Medicare Other | Attending: Emergency Medicine | Admitting: Emergency Medicine

## 2013-06-29 DIAGNOSIS — J45909 Unspecified asthma, uncomplicated: Secondary | ICD-10-CM | POA: Diagnosis not present

## 2013-06-29 DIAGNOSIS — Z79899 Other long term (current) drug therapy: Secondary | ICD-10-CM | POA: Diagnosis not present

## 2013-06-29 DIAGNOSIS — J4489 Other specified chronic obstructive pulmonary disease: Secondary | ICD-10-CM | POA: Insufficient documentation

## 2013-06-29 DIAGNOSIS — Z8701 Personal history of pneumonia (recurrent): Secondary | ICD-10-CM | POA: Diagnosis not present

## 2013-06-29 DIAGNOSIS — I10 Essential (primary) hypertension: Secondary | ICD-10-CM | POA: Diagnosis not present

## 2013-06-29 DIAGNOSIS — C801 Malignant (primary) neoplasm, unspecified: Secondary | ICD-10-CM | POA: Diagnosis not present

## 2013-06-29 DIAGNOSIS — Z8719 Personal history of other diseases of the digestive system: Secondary | ICD-10-CM | POA: Diagnosis not present

## 2013-06-29 DIAGNOSIS — J449 Chronic obstructive pulmonary disease, unspecified: Secondary | ICD-10-CM | POA: Diagnosis not present

## 2013-06-29 DIAGNOSIS — R0602 Shortness of breath: Secondary | ICD-10-CM | POA: Diagnosis not present

## 2013-06-29 DIAGNOSIS — Z7982 Long term (current) use of aspirin: Secondary | ICD-10-CM | POA: Diagnosis not present

## 2013-06-29 DIAGNOSIS — G510 Bell's palsy: Secondary | ICD-10-CM

## 2013-06-29 DIAGNOSIS — Z8709 Personal history of other diseases of the respiratory system: Secondary | ICD-10-CM | POA: Insufficient documentation

## 2013-06-29 DIAGNOSIS — Z882 Allergy status to sulfonamides status: Secondary | ICD-10-CM | POA: Diagnosis not present

## 2013-06-29 MED ORDER — HYPROMELLOSE (GONIOSCOPIC) 2.5 % OP SOLN: 1.0000 [drp] | Freq: Four times a day (QID) | OPHTHALMIC | Status: DC

## 2013-06-29 MED ORDER — PREDNISONE 10 MG PO TABS: 30.0000 mg | ORAL_TABLET | Freq: Two times a day (BID) | ORAL | Status: AC

## 2013-06-29 NOTE — Discharge Instructions (Signed)
Bell's Palsy  Bell's palsy is a condition in which the muscles on one side of the face cannot move (paralysis). This is because the nerves in the face are paralyzed. It is most often thought to be caused by a virus. The virus causes swelling of the nerve that controls movement on one side of the face. The nerve travels through a tight space surrounded by bone. When the nerve swells, it can be compressed by the bone. This results in damage to the protective covering around the nerve. This damage interferes with how the nerve communicates with the muscles of the face. As a result, it can cause weakness or paralysis of the facial muscles.   Injury (trauma), tumor, and surgery may cause Bell's palsy, but most of the time the cause is unknown. It is a relatively common condition. It starts suddenly (abrupt onset) with the paralysis usually ending within 2 days. Bell's palsy is not dangerous. But because the eye does not close properly, you may need care to keep the eye from getting dry. This can include splinting (to keep the eye shut) or moistening with artificial tears. Bell's palsy very seldom occurs on both sides of the face at the same time.  SYMPTOMS    Eyebrow sagging.   Drooping of the eyelid and corner of the mouth.   Inability to close one eye.   Loss of taste on the front of the tongue.   Sensitivity to loud noises.  TREATMENT   The treatment is usually non-surgical. If the patient is seen within the first 24 to 48 hours, a short course of steroids may be prescribed, in an attempt to shorten the length of the condition. Antiviral medicines may also be used with the steroids, but it is unclear if they are helpful.   You will need to protect your eye, if you cannot close it. The cornea (clear covering over your eye) will become dry and can be damaged. Artificial tears can be used to keep your eye moist. Glasses or an eye patch should be worn to protect your eye.  PROGNOSIS   Recovery is variable, ranging  from days to months. Although the problem usually goes away completely (about 80% of cases resolve), predicting the outcome is impossible. Most people improve within 3 weeks of when the symptoms began. Improvement may continue for 3 to 6 months. A small number of people have moderate to severe weakness that is permanent.   HOME CARE INSTRUCTIONS    If your caregiver prescribed medication to reduce swelling in the nerve, use as directed. Do not stop taking the medication unless directed by your caregiver.   Use moisturizing eye drops as needed to prevent drying of your eye, as directed by your caregiver.   Protect your eye, as directed by your caregiver.   Use facial massage and exercises, as directed by your caregiver.   Perform your normal activities, and get your normal rest.  SEEK IMMEDIATE MEDICAL CARE IF:    There is pain, redness or irritation in the eye.   You or your child has an oral temperature above 102 F (38.9 C), not controlled by medicine.  MAKE SURE YOU:    Understand these instructions.   Will watch your condition.   Will get help right away if you are not doing well or get worse.  Document Released: 02/23/2005 Document Revised: 05/18/2011 Document Reviewed: 03/04/2009  [employer] Patient Information 2014 [employer].

## 2013-06-29 NOTE — ED Provider Notes (Signed)
CSN: [id number]     Arrival date & time 06/29/13  1803 History   First MD Initiated Contact with Patient 06/29/13 2003     Chief Complaint  Patient presents with  . Stroke Symptoms     (Consider location/radiation/quality/duration/timing/severity/associated sxs/prior Treatment) HPI  This is a 80 y.o. male with PMH of COPD, hypertension, presenting with facial droop. Onset 2 days ago. Located right face. Persistent, worsening. No meds taken. Described as "weird." Negative for change in vision, change in speech, inability to swallow, chest pain, shortness breath, weakness, numbness, tingling, abdominal pain, change in gait. Negative for recent tick bite.  Past Medical History  Diagnosis Date  . COPD (chronic obstructive pulmonary disease)   . Asthma   . Shortness of breath   . Cancer   . Hypertension   . Recurrent upper respiratory infection (URI)   . Pneumonia   . H/O hiatal hernia    Past Surgical History  Procedure Laterality Date  . Colon surgery     No family history on file. History  Substance Use Topics  . Smoking status: Former Smoker -- 0.50 packs/day for 25 years    Types: Cigarettes, Pipe, Cigars  . Smokeless tobacco: Former Systems developer    Quit date: 03/10/1971  . Alcohol Use: No    Review of Systems  Constitutional: Negative for fever and chills.  HENT: Negative for sore throat.   Eyes: Negative for pain and visual disturbance.  Respiratory: Negative for chest tightness and shortness of breath.   Cardiovascular: Negative for chest pain.  Gastrointestinal: Negative for nausea and vomiting.  Genitourinary: Negative for dysuria.  Musculoskeletal: Negative for arthralgias and myalgias.  Neurological: Negative for headaches.  Psychiatric/Behavioral: Negative for behavioral problems.      Allergies  Sulfa antibiotics  Home Medications   Prior to Admission medications   Medication Sig Start Date End Date Taking? Authorizing Provider  albuterol (PROVENTIL  HFA;VENTOLIN HFA) 108 (90 BASE) MCG/ACT inhaler Inhale 2 puffs into the lungs every 6 (six) hours as needed. For shortness of breath   Yes Historical Provider, MD  arformoterol (BROVANA) 15 MCG/2ML NEBU Take 15 mcg by nebulization 2 (two) times daily.   Yes Historical Provider, MD  aspirin EC 81 MG tablet Take 81 mg by mouth daily.   Yes Historical Provider, MD  atorvastatin (LIPITOR) 80 MG tablet Take 40 mg by mouth every other day.   Yes Historical Provider, MD  budesonide (PULMICORT) 0.5 MG/2ML nebulizer solution Take 0.5 mg by nebulization 2 (two) times daily.   Yes Historical Provider, MD  calcium-vitamin D (OSCAL WITH D) 500-200 MG-UNIT per tablet Take 1 tablet by mouth 2 (two) times daily.    Yes Historical Provider, MD  doxazosin (CARDURA) 2 MG tablet Take 2 mg by mouth daily.   Yes Historical Provider, MD  hydrochlorothiazide (MICROZIDE) 12.5 MG capsule Take 12.5 mg by mouth daily.   Yes Historical Provider, MD  metoprolol succinate (TOPROL XL) 25 MG 24 hr tablet Take 1 tablet (25 mg total) by mouth daily. 02/19/12  Yes Fay Records, MD  Multiple Vitamins-Minerals (MULTIVITAMINS THER. W/MINERALS) TABS Take 1 tablet by mouth daily.   Yes Historical Provider, MD  OXYGEN-HELIUM IN Pt uses o2 2lpm with exertion as needed- [employer]   Yes Historical Provider, MD  psyllium (METAMUCIL) 58.6 % packet Take 1 packet by mouth daily.   Yes Historical Provider, MD   BP 146/111  Pulse 59  Temp(Src) 98.7 F (37.1 C) (Oral)  Resp 18  SpO2  95% Physical Exam  Constitutional: He is oriented to person, place, and time. He appears well-developed and well-nourished. No distress.  HENT:  Head: Normocephalic and atraumatic.  Mouth/Throat: No oropharyngeal exudate.  Eyes: Conjunctivae are normal. Pupils are equal, round, and reactive to light. No scleral icterus.  Neck: Normal range of motion. No tracheal deviation present. No thyromegaly present.  Cardiovascular: Normal rate, regular rhythm and normal heart  sounds.  Exam reveals no gallop and no friction rub.   No murmur heard. Pulmonary/Chest: Effort normal and breath sounds normal. No stridor. No respiratory distress. He has no wheezes. He has no rales. He exhibits no tenderness.  Abdominal: Soft. He exhibits no distension and no mass. There is no tenderness. There is no rebound and no guarding.  Musculoskeletal: Normal range of motion. He exhibits no edema.  Neurological: He is alert and oriented to person, place, and time. He has normal strength. He displays no atrophy and no tremor. No sensory deficit. He exhibits normal muscle tone. He displays a negative Romberg sign. He displays no seizure activity. Coordination and gait normal. GCS eye subscore is 4. GCS verbal subscore is 5. GCS motor subscore is 6.  Reflex Scores:      Patellar reflexes are 2+ on the right side and 2+ on the left side. There is facial drooping on the right side of the face including all 3 branches of the facial nerve.  Skin: Skin is warm and dry. He is not diaphoretic.    ED Course  Procedures (including critical care time)  MDM   Final diagnoses:  None    This is a 80 y.o. male with PMH of COPD, hypertension, presenting with facial droop. Onset 2 days ago. Located right face. Persistent, worsening. No meds taken. Described as "weird." Negative for change in vision, change in speech, inability to swallow, chest pain, shortness breath, weakness, numbness, tingling, abdominal pain, change in gait. Negative for recent tick bite.  Examination as above, with significant facial droop, impairment spanning all 3 branches of the right facial nerve. The patient has absolutely no other neurologic deficits. I do not believe that this represents intracranial hemorrhage, stroke, mass, myelopathy, spinal lesion, meningitis, aneurysm, dissection, or any other concerning etiology. I believe that he is suffering from a Bell's palsy.Pt stable for discharge with prescription for steroids,  acyclovir, artificial tears, paper tape for application to right eye at night, FU with PCP.  All questions answered.  Return precautions given.  I have discussed case and care has been guided by my attending physician, Dr. Mingo Amber.  Doy Hutching, MD 06/30/13 [phone]

## 2013-06-29 NOTE — ED Notes (Signed)
Pt reports right sided facial droop since yesterday. Pt reports "it got worse today." Pt's voice clear, grips equal, PERRLA 27mm. Pt ambulated independently to triage. AO x4. NAD.

## 2013-06-29 NOTE — ED Notes (Signed)
Pt. seen and examined by Dr. Jodi Mourning advised pt. that he has Bell's Palsy .

## 2013-06-30 NOTE — ED Provider Notes (Signed)
I saw and evaluated the patient, reviewed the resident's note and I agree with the findings and plan.   EKG Interpretation None      88 rolled male presents with facial droop. Began 2 days ago, persistent. It involves the forehead. For involvement, patient's presentation is clinically consistent with Bell's palsy. Give and steroids, acyclovir. Instructed to followup with neurology and his primary care doctor.  Osvaldo Shipper, MD 06/30/13 [phone]

## 2013-07-03 DIAGNOSIS — G510 Bell's palsy: Secondary | ICD-10-CM | POA: Diagnosis not present

## 2013-07-03 DIAGNOSIS — I10 Essential (primary) hypertension: Secondary | ICD-10-CM | POA: Diagnosis not present

## 2013-07-03 DIAGNOSIS — K625 Hemorrhage of anus and rectum: Secondary | ICD-10-CM | POA: Diagnosis not present

## 2013-07-20 DIAGNOSIS — J449 Chronic obstructive pulmonary disease, unspecified: Secondary | ICD-10-CM | POA: Diagnosis not present

## 2013-07-20 DIAGNOSIS — J9610 Chronic respiratory failure, unspecified whether with hypoxia or hypercapnia: Secondary | ICD-10-CM | POA: Diagnosis not present

## 2013-08-16 DIAGNOSIS — I10 Essential (primary) hypertension: Secondary | ICD-10-CM | POA: Diagnosis not present

## 2013-08-23 DIAGNOSIS — I10 Essential (primary) hypertension: Secondary | ICD-10-CM | POA: Diagnosis not present

## 2013-08-23 DIAGNOSIS — J449 Chronic obstructive pulmonary disease, unspecified: Secondary | ICD-10-CM | POA: Diagnosis not present

## 2013-08-23 DIAGNOSIS — E785 Hyperlipidemia, unspecified: Secondary | ICD-10-CM | POA: Diagnosis not present

## 2013-08-23 DIAGNOSIS — I2510 Atherosclerotic heart disease of native coronary artery without angina pectoris: Secondary | ICD-10-CM | POA: Diagnosis not present

## 2013-09-19 DIAGNOSIS — R0602 Shortness of breath: Secondary | ICD-10-CM | POA: Diagnosis not present

## 2013-09-19 DIAGNOSIS — J441 Chronic obstructive pulmonary disease with (acute) exacerbation: Secondary | ICD-10-CM | POA: Diagnosis not present

## 2013-09-19 DIAGNOSIS — R059 Cough, unspecified: Secondary | ICD-10-CM | POA: Diagnosis not present

## 2013-09-19 DIAGNOSIS — R05 Cough: Secondary | ICD-10-CM | POA: Diagnosis not present

## 2013-11-02 ENCOUNTER — Ambulatory Visit (INDEPENDENT_AMBULATORY_CARE_PROVIDER_SITE_OTHER): Payer: Medicare Other | Admitting: Internal Medicine

## 2013-11-02 ENCOUNTER — Encounter: Payer: Self-pay | Admitting: Internal Medicine

## 2013-11-02 VITALS — BP 132/64 | HR 85 | Temp 97.9°F | Ht 67.0 in | Wt 170.6 lb

## 2013-11-02 DIAGNOSIS — J449 Chronic obstructive pulmonary disease, unspecified: Secondary | ICD-10-CM

## 2013-11-02 DIAGNOSIS — J9611 Chronic respiratory failure with hypoxia: Secondary | ICD-10-CM

## 2013-11-02 DIAGNOSIS — R0902 Hypoxemia: Secondary | ICD-10-CM | POA: Diagnosis not present

## 2013-11-02 DIAGNOSIS — J9610 Chronic respiratory failure, unspecified whether with hypoxia or hypercapnia: Secondary | ICD-10-CM | POA: Diagnosis not present

## 2013-11-02 DIAGNOSIS — J310 Chronic rhinitis: Secondary | ICD-10-CM | POA: Insufficient documentation

## 2013-11-02 MED ORDER — PREDNISONE 10 MG PO TABS: ORAL_TABLET | ORAL | Status: DC

## 2013-11-02 NOTE — Patient Instructions (Addendum)
Prednisone 10 mg take  4 each am x 2 days,   2 each am x 2 days,  1 each am x 2 days and stop   I emphasized that nasal steroids have no immediate benefit in terms of improving symptoms.  To help them reached the target tissue, the patient should use Afrin two puffs every 12 hours applied one min before using the nasal steroids.  Afrin should be stopped after no more than 5 days.  If the symptoms worsen, Afrin can be restarted after 5 days off of therapy to prevent rebound congestion from overuse of Afrin.  I also emphasized that in no way are nasal steroids a concern in terms of "addiction".   Please schedule a follow up office visit in 6 weeks, call sooner if needed pfts and cxr

## 2013-11-02 NOTE — Assessment & Plan Note (Signed)
Suspect resp symptoms are related to nasal obstruction reducing delivery of nasal 02  I reviewed both graphic and text formatted material regarding the diagnosis and management of rhinitis including how to use topical steroids effectively and why it is necessary to treat nasal obstruction and inflammation both at  the same time to eradicate the cycle of inflammation causing obstruction potentially  causing an infection (not obvious at present) causing inflammation and so forth and so on.Marland KitchenMarland KitchenMarland Kitchen

## 2013-11-02 NOTE — Assessment & Plan Note (Addendum)
Baseline rx 2lpm with exertion and at hs.  Adequate control on present rx, reviewed > no change in rx needed

## 2013-11-02 NOTE — Assessment & Plan Note (Signed)
-   PFT's March 15, 2009 FEV1 1.05 (44%) ratio 39 with 14% better after B2 and DLC048%   - HFA  75% 08/19/2012   Adequate control on present rx, reviewed > no change in rx needed      Each maintenance medication was reviewed in detail including most importantly the difference between maintenance and as needed and under what circumstances the prns are to be used.  Please see instructions for details which were reviewed in writing and the patient given a copy.

## 2013-11-02 NOTE — Progress Notes (Signed)
Subjective:     Patient ID: Trevor Pitts, male   DOB: [birth year]  MRN: [id number]   Brief patient profile:   88  yowm quit smoking around 1970 due to Vocal cord problems per ENT but no residual symptoms but GOLD III documented 03/2009.    History of Present Illness  February 13, 2009 cc variable sob x 10 years with PFT's at [facility] but doesn't remember when gradually worse to point where  needs to go slow and wear 02. Also cough more daytime clear mucus with occasional sensation of throat closing up, mucus getting stuck at level of suprasternal notch and choking him. rec stop benazepril  start benicar 40 mg one daily  Stop fish oil until no longer coughing, then ok to take it again    Admit date: 03/22/2011  Discharge date: 03/25/2011   Discharge Diagnoses:  1-COPD exacerbation 2-CAP (community acquired pneumonia) 3-HYPERLIPIDEMIA 4-HYPERTENSION 5-Dysphagia  6-GERD     11/02/2013   ov/Darrek Leasure re:  02 2lpm ex and sleeping / GOLD III COPD  Chief Complaint  Patient presents with  . Follow-up    Pt c/o increased DOE for the past wk. He also c/o sinus pressure and HA.   doe assoc with worse nasal congestion slt better with nasacort otc, has not tried decongestants, no purulent secretions.  worse in am, onset fairly abrupt p beach trip by car   No obvious day to day or daytime variabilty or assoc chronic cough or cp or chest tightness, subjective wheeze overt sinus or hb symptoms. No unusual exp hx or h/o childhood pna/ asthma or knowledge of premature birth.  Sleeping ok without nocturnal  or early am exacerbation  of respiratory  c/o's or need for noct saba. Also denies any obvious fluctuation of symptoms with weather or environmental changes or other aggravating or alleviating factors except as outlined above   Current Medications, Allergies, Complete Past Medical History, Past Surgical History, Family History, and Social History were reviewed in [employer]  record.  ROS  The following are not active complaints unless bolded sore throat, dysphagia, dental problems, itching, sneezing,  nasal congestion or excess/ purulent secretions, ear ache,   fever, chills, sweats, unintended wt loss, pleuritic or exertional cp, hemoptysis,  orthopnea pnd or leg swelling, presyncope, palpitations, heartburn, abdominal pain, anorexia, nausea, vomiting, diarrhea  or change in bowel or urinary habits, change in stools or urine, dysuria,hematuria,  rash, arthralgias, visual complaints, headache, numbness weakness or ataxia or problems with walking or coordination,  change in mood/affect or memory.         Past Medical History:  C O P D.........................................................Marland KitchenWert  - PFT's March 15, 2009 FEV1 1.05 (44%) ratio 39 with 14% better after B2 and DLC048%  Hyperlipidemia  Hypertension       Objective:   Physical Exam  Wt 174 February 13, 2009 >  > 177 March 30, 2010 > 177 04/16/2011 > 07/22/2011  176> 07/27/12  173 > 08/19/2012 173 >173 04/28/2013  > 11/02/2013 171   In general robust pleasant amb wm nad  HEENT mod bilateral nonspecific edema of  turbinates. Oropharynx no thrush or excess pnd or cobblestoning. No JVD or cervical adenopathy. Mild accessory muscle hypertrophy. Trachea midline, nl thryroid. Chest  With minimal exp rhonchi bilaterally   Regular rate and rhythm without murmur gallop or rub or increase P2 or edema. Abd: no hsm, nl excursion. Ext warm without cyanosis or clubbing.     CXR  05/17/2013 :  Findings consist with stable pulmonary interstitial fibrosis, pleural parenchymal scarring, old granulomatous disease, and COPD. No acute abnormality identified.     Assessment:

## 2013-12-13 ENCOUNTER — Other Ambulatory Visit: Payer: Self-pay | Admitting: Internal Medicine

## 2013-12-13 DIAGNOSIS — R0600 Dyspnea, unspecified: Secondary | ICD-10-CM

## 2013-12-14 ENCOUNTER — Encounter: Payer: Self-pay | Admitting: Internal Medicine

## 2013-12-14 ENCOUNTER — Ambulatory Visit (INDEPENDENT_AMBULATORY_CARE_PROVIDER_SITE_OTHER): Payer: Medicare Other | Admitting: Internal Medicine

## 2013-12-14 ENCOUNTER — Other Ambulatory Visit (INDEPENDENT_AMBULATORY_CARE_PROVIDER_SITE_OTHER): Payer: Medicare Other

## 2013-12-14 ENCOUNTER — Ambulatory Visit (INDEPENDENT_AMBULATORY_CARE_PROVIDER_SITE_OTHER)
Admission: RE | Admit: 2013-12-14 | Discharge: 2013-12-14 | Disposition: A | Discharge status: Home / Self Care | Payer: Medicare Other | Source: Ambulatory Visit | Attending: Internal Medicine | Admitting: Internal Medicine

## 2013-12-14 VITALS — BP 124/64 | HR 54 | Temp 97.8°F | Ht 67.0 in | Wt 170.0 lb

## 2013-12-14 DIAGNOSIS — Z23 Encounter for immunization: Secondary | ICD-10-CM

## 2013-12-14 DIAGNOSIS — R300 Dysuria: Secondary | ICD-10-CM

## 2013-12-14 DIAGNOSIS — J984 Other disorders of lung: Secondary | ICD-10-CM | POA: Diagnosis not present

## 2013-12-14 DIAGNOSIS — R0600 Dyspnea, unspecified: Secondary | ICD-10-CM

## 2013-12-14 DIAGNOSIS — J449 Chronic obstructive pulmonary disease, unspecified: Secondary | ICD-10-CM | POA: Diagnosis not present

## 2013-12-14 DIAGNOSIS — J9611 Chronic respiratory failure with hypoxia: Secondary | ICD-10-CM | POA: Diagnosis not present

## 2013-12-14 LAB — URINALYSIS
Bilirubin Urine: NEGATIVE
Hgb urine dipstick: NEGATIVE
KETONES UR: NEGATIVE
LEUKOCYTES UA: NEGATIVE
NITRITE: NEGATIVE
PH: 5 (ref 5.0–8.0)
SPECIFIC GRAVITY, URINE: 1.015 (ref 1.000–1.030)
Total Protein, Urine: NEGATIVE
UROBILINOGEN UA: 0.2 (ref 0.0–1.0)
Urine Glucose: NEGATIVE

## 2013-12-14 LAB — PULMONARY FUNCTION TEST
DL/VA % PRED: 61 %
DL/VA: 2.67 ml/min/mmHg/L
DLCO unc % pred: 35 %
DLCO unc: 10.12 ml/min/mmHg
FEF 25-75 PRE: 0.35 L/s
FEF 25-75 Post: 0.44 L/sec
FEF2575-%CHANGE-POST: 25 %
FEF2575-%PRED-POST: 32 %
FEF2575-%PRED-PRE: 26 %
FEV1-%CHANGE-POST: 8 %
FEV1-%PRED-PRE: 41 %
FEV1-%Pred-Post: 45 %
FEV1-POST: 0.99 L
FEV1-PRE: 0.91 L
FEV1FVC-%CHANGE-POST: 0 %
FEV1FVC-%PRED-PRE: 69 %
FEV6-%Change-Post: 8 %
FEV6-%Pred-Post: 66 %
FEV6-%Pred-Pre: 61 %
FEV6-PRE: 1.82 L
FEV6-Post: 1.98 L
FEV6FVC-%Change-Post: 0 %
FEV6FVC-%PRED-PRE: 104 %
FEV6FVC-%Pred-Post: 105 %
FVC-%Change-Post: 8 %
FVC-%Pred-Post: 63 %
FVC-%Pred-Pre: 58 %
FVC-Post: 2.05 L
FVC-Pre: 1.89 L
POST FEV1/FVC RATIO: 48 %
POST FEV6/FVC RATIO: 97 %
Pre FEV1/FVC ratio: 48 %
Pre FEV6/FVC Ratio: 96 %
RV % pred: 138 %
RV: 3.64 L
TLC % PRED: 94 %
TLC: 6.15 L

## 2013-12-14 NOTE — Progress Notes (Addendum)
Subjective:     Patient ID: Trevor Pitts, male   DOB: 08/14/1924  MRN: [id number]   Brief patient profile:   89  yowm quit smoking around 1970 due to Vocal cord problems per ENT but no residual symptoms but GOLD III documented 03/2009.    History of Present Illness  February 13, 2009 cc variable sob x 10 years with PFT's at [facility] but doesn't remember when gradually worse to point where  needs to go slow and wear 02. Also cough more daytime clear mucus with occasional sensation of throat closing up, mucus getting stuck at level of suprasternal notch and choking him. rec stop benazepril  start benicar 40 mg one daily  Stop fish oil until no longer coughing, then ok to take it again    Admit date: 03/22/2011  Discharge date: 03/25/2011   Discharge Diagnoses:  1-COPD exacerbation 2-CAP (community acquired pneumonia) 3-HYPERLIPIDEMIA 4-HYPERTENSION 5-Dysphagia  6-GERD     11/02/2013   ov/Andrewjames Weirauch re:  02 2lpm ex and sleeping / GOLD III COPD  Chief Complaint  Patient presents with  . Follow-up    Pt c/o increased DOE for the past wk. He also c/o sinus pressure and HA.   doe assoc with worse nasal congestion slt better with nasacort otc, has not tried decongestants, no purulent secretions.  worse in am, onset fairly abrupt p beach trip by car  rec Prednisone 10 mg take  4 each am x 2 days,   2 each am x 2 days,  1 each am x 2 days and stop  I emphasized that nasal steroids have no immediate benefit .      12/14/2013 f/u ov/Sherard Sutch re: copd GOLD III on Brov/bud bid  Chief Complaint  Patient presents with  . Follow-up    PFT done today. Breathing is unchanged since the last visit.    mb to house s stopping, no need for saba  Main complaint is variable urinary hesitance/burning x sev weeks on cardura 2 mg daily and being followed for this by urology and primary care   No obvious day to day or daytime variabilty or assoc chronic cough or cp or chest tightness, subjective wheeze overt sinus or  hb symptoms. No unusual exp hx or h/o childhood pna/ asthma or knowledge of premature birth.  Sleeping ok without nocturnal  or early am exacerbation  of respiratory  c/o's or need for noct saba. Also denies any obvious fluctuation of symptoms with weather or environmental changes or other aggravating or alleviating factors except as outlined above   Current Medications, Allergies, Complete Past Medical History, Past Surgical History, Family History, and Social History were reviewed in [employer] record.  ROS  The following are not active complaints unless bolded sore throat, dysphagia, dental problems, itching, sneezing,  nasal congestion or excess/ purulent secretions, ear ache,   fever, chills, sweats, unintended wt loss, pleuritic or exertional cp, hemoptysis,  orthopnea pnd or leg swelling, presyncope, palpitations, heartburn, abdominal pain, anorexia, nausea, vomiting, diarrhea  or change  urinary habits, change in stools or urine, dysuria,hematuria,  rash, arthralgias, visual complaints, headache, numbness weakness or ataxia or problems with walking or coordination,  change in mood/affect or memory.         Past Medical History:  C O P D.........................................................Marland KitchenWert  - PFT's March 15, 2009 FEV1 1.05 (44%) ratio 39 with 14% better after B2 and DLC048%  Hyperlipidemia  Hypertension       Objective:   Physical  Exam  Wt 174 February 13, 2009 >  > 177 March 30, 2010 > 177 04/16/2011 > 07/22/2011  176> 07/27/12  173 > 08/19/2012 173 >173 04/28/2013  > 11/02/2013 171 > 12/14/2013  170   In general robust pleasant amb wm nad   HEENT mod bilateral nonspecific edema of  turbinates. Oropharynx no thrush or excess pnd or cobblestoning. No JVD or cervical adenopathy. Mild accessory muscle hypertrophy. Trachea midline, nl thryroid. Chest  With minimal exp rhonchi bilaterally   Regular rate and rhythm without murmur gallop or rub or increase P2 or  edema. Abd: no hsm, nl excursion. Ext warm without cyanosis or clubbing.     CXR  12/14/2013 : Chronic lung disease with basilar fibrosis and calcified granulomata. No change from the prior study.    U/a neg      Assessment:

## 2013-12-14 NOTE — Progress Notes (Signed)
Quick Note:  Spoke with pt and notified of results per Dr. Wert. Pt verbalized understanding and denied any questions.  ______ 

## 2013-12-14 NOTE — Patient Instructions (Addendum)
Please remember to go to the lab and x-ray department downstairs for your tests - we will call you with the results when they are available.    Increase cardura to 2 at bedtime until your bladder problem is better   Please schedule a follow up visit in 3 months but call sooner if needed  Add consider lama next ov but make sure bladder better

## 2013-12-14 NOTE — Assessment & Plan Note (Signed)
U/a ok so  rec increase cardura to 2 mg x 2 each hs and f/u with urology

## 2013-12-14 NOTE — Assessment & Plan Note (Signed)
Baseline rx 2lpm with exertion and at hs.  Ok to leave off with exertion if doesn't feel he needs it

## 2013-12-14 NOTE — Assessment & Plan Note (Signed)
-   PFT's March 15, 2009 FEV1 1.20 (51%) ratio 37 p 14% from  saba  and DLC048%  -  PFT's  12/14/2013          FEV1 0.99 (45%) ratio 48 p 8%  from saba and DLCO 35 p am neb laba neb   He has an acceptalbe level of doe to where he really doesn't feel need to wear 02 but is severe enough to consider adding lama once his urinary troubles get resolved > for now no change on rx    Each maintenance medication was reviewed in detail including most importantly the difference between maintenance and as needed and under what circumstances the prns are to be used.  Please see instructions for details which were reviewed in writing and the patient given a copy.

## 2013-12-14 NOTE — Progress Notes (Signed)
PFT done today. 

## 2013-12-18 DIAGNOSIS — R300 Dysuria: Secondary | ICD-10-CM | POA: Diagnosis not present

## 2013-12-18 DIAGNOSIS — R351 Nocturia: Secondary | ICD-10-CM | POA: Diagnosis not present

## 2014-01-05 ENCOUNTER — Telehealth: Payer: Self-pay | Admitting: Internal Medicine

## 2014-01-05 ENCOUNTER — Ambulatory Visit (INDEPENDENT_AMBULATORY_CARE_PROVIDER_SITE_OTHER): Payer: Medicare Other | Admitting: Pulmonary Disease

## 2014-01-05 ENCOUNTER — Encounter: Payer: Self-pay | Admitting: Pulmonary Disease

## 2014-01-05 VITALS — BP 140/88 | HR 65 | Temp 97.9°F | Ht 67.0 in | Wt 172.6 lb

## 2014-01-05 DIAGNOSIS — J441 Chronic obstructive pulmonary disease with (acute) exacerbation: Secondary | ICD-10-CM

## 2014-01-05 MED ORDER — PREDNISONE 10 MG PO TABS: ORAL_TABLET | ORAL | Status: DC

## 2014-01-05 MED ORDER — DOXYCYCLINE HYCLATE 100 MG PO TABS: 100.0000 mg | ORAL_TABLET | Freq: Two times a day (BID) | ORAL | Status: DC

## 2014-01-05 NOTE — Progress Notes (Signed)
Chief Complaint  Patient presents with  . Follow-up    Using 2L pulsed O2 PRN. Pt c/o increased cough with SOB and chest tightness. Pt reports this is worse in PM when lying down. Pt feels bloated, full of air.     History of Present Illness: Trevor Pitts is a 80 y.o. male with COPD, VCD, and chronic respiratory failure.  He is followed by Dr. [name].    He has increased cough and wheeze for the past 3 days.  This is worse at night and damp weather makes worse also.  He does not think he has fever.  He brings up clear to yellow sputum.  He is not having sinus congestion or sore throat.  He tries using albuterol, but this can make his cough worse.  He uses 2 liters oxygen.  Past medical hx, Past surgical hx, Medications, Allergies, Family hx, Social hx all reviewed.   Physical Exam:  General - No distress ENT - No sinus tenderness, no oral exudate, no LAN Cardiac - s1s2 regular, no murmur Chest - prolonged exhalation, faint b/l wheeze, no rales Back - No focal tenderness Abd - Soft, non-tender Ext - No edema Neuro - Normal strength Skin - No rashes Psych - normal mood, and behavior   Assessment/Plan:  Trevor Mires, MD [facility] Pager:  [phone]

## 2014-01-05 NOTE — Telephone Encounter (Signed)
Called pt. appt scheduled to see VS today at 11. Nothing further needed

## 2014-01-05 NOTE — Telephone Encounter (Signed)
Please schedule him for acute visit today.  Can double book visit if needed.

## 2014-01-05 NOTE — Assessment & Plan Note (Signed)
Will give course of prednisone and doxycycline.

## 2014-01-05 NOTE — Telephone Encounter (Signed)
Called spoke with pt. C/o dry cough causing increase SOB, wheezing, tightness in stomach. He is using his neb medications and they seem to help. He is requesting OV today. Nothing available today. Please advise VS as MW is off today thanks  Allergies  Allergen Reactions  . Sulfa Antibiotics

## 2014-01-05 NOTE — Patient Instructions (Signed)
Prednisone 10 mg pill >> 3 pills daily for 2 days, 2 pills daily for 2 days, 1 pill daily for 2 days Doxycycline 100 mg pill >> 1 pill twice per day for 7 days Follow up with Dr. [name] in 2 weeks

## 2014-01-18 ENCOUNTER — Encounter: Payer: Self-pay | Admitting: Internal Medicine

## 2014-01-18 ENCOUNTER — Ambulatory Visit (INDEPENDENT_AMBULATORY_CARE_PROVIDER_SITE_OTHER): Payer: Medicare Other | Admitting: Internal Medicine

## 2014-01-18 VITALS — BP 124/80 | HR 81 | Temp 97.9°F | Ht 67.0 in | Wt 171.6 lb

## 2014-01-18 DIAGNOSIS — J9611 Chronic respiratory failure with hypoxia: Secondary | ICD-10-CM

## 2014-01-18 DIAGNOSIS — J449 Chronic obstructive pulmonary disease, unspecified: Secondary | ICD-10-CM

## 2014-01-18 NOTE — Assessment & Plan Note (Signed)
Using 02 at hs and outside house   Adequate control on present rx, reviewed > no change in rx needed

## 2014-01-18 NOTE — Progress Notes (Signed)
Subjective:     Patient ID: Trevor Pitts, male   DOB: [birth year]  MRN: [id number]   Brief patient profile:   89  yowm quit smoking around 1970 due to Vocal cord problems per ENT but no residual symptoms but GOLD III documented 03/2009.    History of Present Illness  February 13, 2009 cc variable sob x 10 years with PFT's at [facility] but doesn't remember when gradually worse to point where  needs to go slow and wear 02. Also cough more daytime clear mucus with occasional sensation of throat closing up, mucus getting stuck at level of suprasternal notch and choking him. rec stop benazepril  start benicar 40 mg one daily  Stop fish oil until no longer coughing, then ok to take it again    Admit date: 03/22/2011  Discharge date: 03/25/2011   Discharge Diagnoses:  1-COPD exacerbation 2-CAP (community acquired pneumonia) 3-HYPERLIPIDEMIA 4-HYPERTENSION 5-Dysphagia  6-GERD     11/02/2013   ov/Trevor Pitts re:  02 2lpm ex and sleeping / GOLD III COPD  Chief Complaint  Patient presents with  . Follow-up    Pt c/o increased DOE for the past wk. He also c/o sinus pressure and HA.   doe assoc with worse nasal congestion slt better with nasacort otc, has not tried decongestants, no purulent secretions.  worse in am, onset fairly abrupt p beach trip by car  rec Prednisone 10 mg take  4 each am x 2 days,   2 each am x 2 days,  1 each am x 2 days and stop  I emphasized that nasal steroids have no immediate benefit .      12/14/2013 f/u ov/Trevor Pitts re: copd GOLD III on Brov/bud bid  Chief Complaint  Patient presents with  . Follow-up    PFT done today. Breathing is unchanged since the last visit.    mb to house s stopping, no need for saba  Main complaint is variable urinary hesitance/burning x sev weeks on cardura 2 mg daily and being followed for this by urology and primary care  rec Please remember to go to the lab and x-ray department downstairs for your tests - we will call you with the results when  they are available. Increase cardura to 2 at bedtime until your bladder problem is better  Add consider lama next ov but make sure bladder better     01/18/2014 f/u ov/Trevor Pitts re:  GOLD III   Chief Complaint  Patient presents with  . Follow-up    Pt states that his breathing is much improved since the last visit. He states not having any more chest tightness. Cough is about the same- "I always had a little dry cough". He has not used rescue inhaler since the last visit.    Not limited by breathing from desired activities, rarely using 02 at all  No obvious day to day or daytime variabilty or assoc chronic cough or cp or chest tightness, subjective wheeze overt sinus or hb symptoms. No unusual exp hx or h/o childhood pna/ asthma or knowledge of premature birth.  Sleeping ok without nocturnal  or early am exacerbation  of respiratory  c/o's or need for noct saba. Also denies any obvious fluctuation of symptoms with weather or environmental changes or other aggravating or alleviating factors except as outlined above   Current Medications, Allergies, Complete Past Medical History, Past Surgical History, Family History, and Social History were reviewed in [employer] record.  ROS  The following  are not active complaints unless bolded sore throat, dysphagia, dental problems, itching, sneezing,  nasal congestion or excess/ purulent secretions, ear ache,   fever, chills, sweats, unintended wt loss, pleuritic or exertional cp, hemoptysis,  orthopnea pnd or leg swelling, presyncope, palpitations, heartburn, abdominal pain, anorexia, nausea, vomiting, diarrhea  or change  urinary habits, change in stools or urine, dysuria,hematuria,  rash, arthralgias, visual complaints, headache, numbness weakness or ataxia or problems with walking or coordination,  change in mood/affect or memory.         Past Medical History:  C O P D.........................................................Marland KitchenWert   - PFT's March 15, 2009 FEV1 1.05 (44%) ratio 39 with 14% better after B2 and DLC048%  Hyperlipidemia  Hypertension       Objective:   Physical Exam  Wt 174 February 13, 2009 >  > 177 March 30, 2010 > 177 04/16/2011 > 07/22/2011  176> 07/27/12  173 > 08/19/2012 173 >173 04/28/2013  > 11/02/2013 171 > 12/14/2013  170  01/18/2014  172   In general robust pleasant amb wm nad   HEENT mod bilateral nonspecific edema of  turbinates. Oropharynx no thrush or excess pnd or cobblestoning. No JVD or cervical adenopathy. Mild accessory muscle hypertrophy. Trachea midline, nl thryroid. Chest  With minimal exp rhonchi bilaterally   Regular rate and rhythm without murmur gallop or rub or increase P2 or edema. Abd: no hsm, nl excursion. Ext warm without cyanosis or clubbing.     CXR  12/14/2013 : Chronic lung disease with basilar fibrosis and calcified granulomata. No change from the prior study.         Assessment:

## 2014-01-18 NOTE — Patient Instructions (Addendum)
Please schedule a follow up visit in 3 months but call sooner if needed with all active medications in hand including inhalers and neb solutions

## 2014-03-22 DIAGNOSIS — Z0000 Encounter for general adult medical examination without abnormal findings: Secondary | ICD-10-CM | POA: Diagnosis not present

## 2014-03-22 DIAGNOSIS — E785 Hyperlipidemia, unspecified: Secondary | ICD-10-CM | POA: Diagnosis not present

## 2014-03-22 DIAGNOSIS — Z1389 Encounter for screening for other disorder: Secondary | ICD-10-CM | POA: Diagnosis not present

## 2014-03-22 DIAGNOSIS — I25119 Atherosclerotic heart disease of native coronary artery with unspecified angina pectoris: Secondary | ICD-10-CM | POA: Diagnosis not present

## 2014-03-22 DIAGNOSIS — I10 Essential (primary) hypertension: Secondary | ICD-10-CM | POA: Diagnosis not present

## 2014-03-22 DIAGNOSIS — J441 Chronic obstructive pulmonary disease with (acute) exacerbation: Secondary | ICD-10-CM | POA: Diagnosis not present

## 2014-03-29 DIAGNOSIS — E782 Mixed hyperlipidemia: Secondary | ICD-10-CM | POA: Diagnosis not present

## 2014-03-29 DIAGNOSIS — M810 Age-related osteoporosis without current pathological fracture: Secondary | ICD-10-CM | POA: Diagnosis not present

## 2014-03-29 DIAGNOSIS — I2510 Atherosclerotic heart disease of native coronary artery without angina pectoris: Secondary | ICD-10-CM | POA: Diagnosis not present

## 2014-03-29 DIAGNOSIS — I10 Essential (primary) hypertension: Secondary | ICD-10-CM | POA: Diagnosis not present

## 2014-04-05 DIAGNOSIS — H43393 Other vitreous opacities, bilateral: Secondary | ICD-10-CM | POA: Diagnosis not present

## 2014-04-11 DIAGNOSIS — C61 Malignant neoplasm of prostate: Secondary | ICD-10-CM | POA: Diagnosis not present

## 2014-04-11 DIAGNOSIS — R351 Nocturia: Secondary | ICD-10-CM | POA: Diagnosis not present

## 2014-04-18 ENCOUNTER — Encounter: Payer: Self-pay | Admitting: Internal Medicine

## 2014-04-18 ENCOUNTER — Ambulatory Visit (INDEPENDENT_AMBULATORY_CARE_PROVIDER_SITE_OTHER): Payer: Medicare Other | Admitting: Internal Medicine

## 2014-04-18 VITALS — BP 142/80 | HR 72 | Ht 67.0 in | Wt 168.0 lb

## 2014-04-18 DIAGNOSIS — J9611 Chronic respiratory failure with hypoxia: Secondary | ICD-10-CM

## 2014-04-18 DIAGNOSIS — J449 Chronic obstructive pulmonary disease, unspecified: Secondary | ICD-10-CM | POA: Diagnosis not present

## 2014-04-18 DIAGNOSIS — I10 Essential (primary) hypertension: Secondary | ICD-10-CM

## 2014-04-18 MED ORDER — IRBESARTAN 150 MG PO TABS: 150.0000 mg | ORAL_TABLET | Freq: Every day | ORAL | Status: DC

## 2014-04-18 NOTE — Patient Instructions (Addendum)
Stop benazapril   Avapro 150 mg one at bedtime instead of benazapril   Follow up 3 months

## 2014-04-18 NOTE — Assessment & Plan Note (Signed)
-   PFT's March 15, 2009 FEV1 1.20 (51%) ratio 37 p 14% from  saba  and DLC048%  -  PFT's  12/14/2013          FEV1 0.99 (45%) ratio 48 p 8%  from saba and DLCO 35 p am neb laba neb   - HFA  75% 08/19/2012   Adequate control on present rx, reviewed > no change in rx needed      Each maintenance medication was reviewed in detail including most importantly the difference between maintenance and as needed and under what circumstances the prns are to be used.  Please see instructions for details which were reviewed in writing and the patient given a copy.

## 2014-04-18 NOTE — Progress Notes (Signed)
Subjective:     Patient ID: Trevor Pitts, male   DOB: Jan 01, 1925  MRN: [id number]   Brief patient profile:   89  yowm quit smoking around 1970 due to Vocal cord problems per ENT but no residual symptoms then  but dx GOLD III documented 03/2009.    History of Present Illness  February 13, 2009 cc variable sob x 10 years with PFT's at [facility] but doesn't remember when gradually worse to point where  needs to go slow and wear 02. Also cough more daytime clear mucus with occasional sensation of throat closing up, mucus getting stuck at level of suprasternal notch and choking him. rec stop benazepril  start benicar 40 mg one daily  Stop fish oil until no longer coughing, then ok to take it again    Admit date: 03/22/2011  Discharge date: 03/25/2011   Discharge Diagnoses:  1-COPD exacerbation 2-CAP (community acquired pneumonia) 3-HYPERLIPIDEMIA 4-HYPERTENSION 5-Dysphagia  6-GERD     11/02/2013   ov/Trevor Pitts re:  02 2lpm ex and sleeping / GOLD III COPD  Chief Complaint  Patient presents with  . Follow-up    Pt c/o increased DOE for the past wk. He also c/o sinus pressure and HA.   doe assoc with worse nasal congestion slt better with nasacort otc, has not tried decongestants, no purulent secretions.  worse in am, onset fairly abrupt p beach trip by car  rec Prednisone 10 mg take  4 each am x 2 days,   2 each am x 2 days,  1 each am x 2 days and stop  I emphasized that nasal steroids have no immediate benefit .      12/14/2013 f/u ov/Trevor Pitts re: copd GOLD III on Brov/bud bid  Chief Complaint  Patient presents with  . Follow-up    PFT done today. Breathing is unchanged since the last visit.    mb to house s stopping, no need for saba  Main complaint is variable urinary hesitance/burning x sev weeks on cardura 2 mg daily and being followed for this by urology and primary care  rec Please remember to go to the lab and x-ray department downstairs for your tests - we will call you with the  results when they are available. Increase cardura to 2 at bedtime until your bladder problem is better  Add consider lama next ov but make sure bladder better     01/18/2014 f/u ov/Trevor Pitts re:  GOLD III   Chief Complaint  Patient presents with  . Follow-up    Pt states that his breathing is much improved since the last visit. He states not having any more chest tightness. Cough is about the same- "I always had a little dry cough". He has not used rescue inhaler since the last visit.   rec Please schedule a follow up visit in 3 months but call sooner if needed with all active medications in hand including inhalers and neb solutions    04/18/2014 f/u ov/Trevor Pitts re: GOLD III COPD/ revealed on ACEi since Dr Pauline Aus gave it to him ? When  Chief Complaint  Patient presents with  . Acute Visit    Pt c/o increased cough for the past 3 days. He also c/o more SOB than usual.    No 02 sleeping or sitting/ does mb to house s stopping s 02  Cough is worse than usual but has been present x months, dry, daytime assoc with hoarseness on ACEi chronically unbeknownst to me   No obvious  day to day or daytime variabilty or assoc chronic cough or cp or chest tightness, subjective wheeze overt sinus or hb symptoms. No unusual exp hx or h/o childhood pna/ asthma or knowledge of premature birth.  Sleeping ok without nocturnal  or early am exacerbation  of respiratory  c/o's or need for noct saba. Also denies any obvious fluctuation of symptoms with weather or environmental changes or other aggravating or alleviating factors except as outlined above   Current Medications, Allergies, Complete Past Medical History, Past Surgical History, Family History, and Social History were reviewed in [employer] record.  ROS  The following are not active complaints unless bolded sore throat, dysphagia, dental problems, itching, sneezing,  nasal congestion or excess/ purulent secretions, ear ache,   fever,  chills, sweats, unintended wt loss, pleuritic or exertional cp, hemoptysis,  orthopnea pnd or leg swelling, presyncope, palpitations, heartburn, abdominal pain, anorexia, nausea, vomiting, diarrhea  or change  urinary habits, change in stools or urine, dysuria,hematuria,  rash, arthralgias, visual complaints, headache, numbness weakness or ataxia or problems with walking or coordination,  change in mood/affect or memory.         Past Medical History:  C O P D.........................................................Marland KitchenWert  - PFT's March 15, 2009 FEV1 1.05 (44%) ratio 39 with 14% better after B2 and DLC048%  Hyperlipidemia  Hypertension       Objective:   Physical Exam  Wt 174 February 13, 2009 >  > 177 March 30, 2010 > 177 04/16/2011 > 07/22/2011  176> 07/27/12  173 > 08/19/2012 173 >173 04/28/2013  > 11/02/2013 171 > 12/14/2013  170  01/18/2014  172 > 04/18/2014  168   In general robust pleasant amb wm nad   HEENT mod bilateral nonspecific edema of  turbinates. Oropharynx no thrush or excess pnd or cobblestoning. No JVD or cervical adenopathy. Mild accessory muscle hypertrophy. Trachea midline, nl thryroid. Chest  With minimal exp rhonchi bilaterally   Regular rate and rhythm without murmur gallop or rub or increase P2 or edema. Abd: no hsm, nl excursion. Ext warm without cyanosis or clubbing.     CXR  12/14/2013 : Chronic lung disease with basilar fibrosis and calcified granulomata. No change from the prior study.         Assessment:

## 2014-04-19 ENCOUNTER — Ambulatory Visit: Payer: Medicare Other | Admitting: Internal Medicine

## 2014-04-19 NOTE — Assessment & Plan Note (Addendum)
ACE inhibitors are problematic in  pts with airway complaints because  even experienced pulmonologists can't always distinguish ace effects from copd/asthma.  By themselves they don't actually cause a problem, much like oxygen can't by itself start a fire, but they certainly serve as a powerful catalyst or enhancer for any "fire"  or inflammatory process in the upper airway, be it caused by an ET  tube or more commonly reflux (especially in the obese or pts with known GERD or who are on biphoshonates).    In the era of ARB near equivalency until we have a better handle on the reversibility of the airway problem, it just makes sense to avoid ACEI  entirely in the short run and then decide later, having established a level of airway control using a reasonable limited regimen, whether to add back ace but even then being very careful to observe the pt for worsening airway control and number of meds used/ needed to control symptoms.    Try off benazapril and on avapro 150 mg daily

## 2014-04-19 NOTE — Assessment & Plan Note (Signed)
-   PFT's March 15, 2009 FEV1 1.20 (51%) ratio 37 p 14% from  saba  and DLC048%  -  PFT's  12/14/2013          FEV1 0.99 (45%) ratio 48 p 8%  from saba and DLCO 35 p am neb laba neb   -  Trial off acei 04/18/2014 >>>    His symptoms have been chronically difficult to sort out and in retrospect I suspect this may be at least partly acei related > the only way to know is to try off x 4-6 weeks     Each maintenance medication was reviewed in detail including most importantly the difference between maintenance and as needed and under what circumstances the prns are to be used.  Please see instructions for details which were reviewed in writing and the patient given a copy.

## 2014-04-19 NOTE — Assessment & Plan Note (Signed)
Baseline rx 2lpm with exertion and at hs.  sats ok at rest RA today   Adequate control on present rx, reviewed > no change in rx needed

## 2014-04-20 ENCOUNTER — Telehealth: Payer: Self-pay | Admitting: Internal Medicine

## 2014-04-20 MED ORDER — PREDNISONE 10 MG PO TABS: ORAL_TABLET | ORAL | Status: DC

## 2014-04-20 NOTE — Telephone Encounter (Signed)
Calling back to f/u on appt. Please cb at previous number listed

## 2014-04-20 NOTE — Telephone Encounter (Signed)
Pt aware of recs.  Prednisone sent to pharmacy.  Nothing further needed.

## 2014-04-20 NOTE — Telephone Encounter (Signed)
Changing the bp med will help but take time  in meantime rec Prednisone 10 mg take  4 each am x 2 days,   2 each am x 2 days,  1 each am x 2 days and stop and delsym cough syrup plus For drainage take chlortrimeton (chlorpheniramine) 4 mg every 4 hours available over the counter (may cause drowsiness)

## 2014-04-20 NOTE — Telephone Encounter (Signed)
Spoke with pt, was here Wednesday, c/o sinus congestion, PND, sometimes prod cough with lots of clear mucus.  States on Wednesday nothing was addressed about this, only his bp med was changed.  Is requesting recs.  Uses [facility] on [address].  MW please advise.  Thank you.

## 2014-04-20 NOTE — Telephone Encounter (Signed)
Advised pt that MW is in clinic with pts but his concerns will be addressed today and we will call him with these recs as soon as we have them.    MW please advise.  Thank you.

## 2014-04-25 DIAGNOSIS — H35032 Hypertensive retinopathy, left eye: Secondary | ICD-10-CM | POA: Diagnosis not present

## 2014-04-25 DIAGNOSIS — H43813 Vitreous degeneration, bilateral: Secondary | ICD-10-CM | POA: Diagnosis not present

## 2014-04-25 DIAGNOSIS — H34231 Retinal artery branch occlusion, right eye: Secondary | ICD-10-CM | POA: Diagnosis not present

## 2014-04-25 DIAGNOSIS — H3531 Nonexudative age-related macular degeneration: Secondary | ICD-10-CM | POA: Diagnosis not present

## 2014-04-29 ENCOUNTER — Encounter (HOSPITAL_COMMUNITY): Payer: Self-pay | Admitting: *Deleted

## 2014-04-29 ENCOUNTER — Emergency Department (HOSPITAL_COMMUNITY): Payer: Medicare Other

## 2014-04-29 ENCOUNTER — Emergency Department (HOSPITAL_COMMUNITY)
Admission: EM | Admit: 2014-04-29 | Discharge: 2014-04-29 | Disposition: A | Discharge status: Home / Self Care | Payer: Medicare Other | Attending: Emergency Medicine | Admitting: Emergency Medicine

## 2014-04-29 DIAGNOSIS — J449 Chronic obstructive pulmonary disease, unspecified: Secondary | ICD-10-CM | POA: Diagnosis not present

## 2014-04-29 DIAGNOSIS — J209 Acute bronchitis, unspecified: Secondary | ICD-10-CM | POA: Diagnosis not present

## 2014-04-29 DIAGNOSIS — I10 Essential (primary) hypertension: Secondary | ICD-10-CM | POA: Insufficient documentation

## 2014-04-29 DIAGNOSIS — R05 Cough: Secondary | ICD-10-CM | POA: Diagnosis present

## 2014-04-29 DIAGNOSIS — Z87891 Personal history of nicotine dependence: Secondary | ICD-10-CM | POA: Diagnosis not present

## 2014-04-29 LAB — COMPREHENSIVE METABOLIC PANEL
ALT: 33 U/L (ref 0–53)
ANION GAP: 7 (ref 5–15)
AST: 33 U/L (ref 0–37)
Albumin: 3.6 g/dL (ref 3.5–5.2)
Alkaline Phosphatase: 67 U/L (ref 39–117)
BILIRUBIN TOTAL: 0.6 mg/dL (ref 0.3–1.2)
BUN: 38 mg/dL — ABNORMAL HIGH (ref 6–23)
CO2: 28 mmol/L (ref 19–32)
CREATININE: 1.63 mg/dL — AB (ref 0.50–1.35)
Calcium: 9.3 mg/dL (ref 8.4–10.5)
Chloride: 105 mmol/L (ref 96–112)
GFR, EST AFRICAN AMERICAN: 42 mL/min — AB (ref >90)
GFR, EST NON AFRICAN AMERICAN: 36 mL/min — AB (ref >90)
Glucose, Bld: 111 mg/dL — ABNORMAL HIGH (ref 70–99)
POTASSIUM: 4.1 mmol/L (ref 3.5–5.1)
Sodium: 140 mmol/L (ref 135–145)
Total Protein: 6.3 g/dL (ref 6.0–8.3)

## 2014-04-29 LAB — CBC WITH DIFFERENTIAL/PLATELET
BASOS ABS: 0 10*3/uL (ref 0.0–0.1)
Basophils Relative: 0 % (ref 0–1)
EOS ABS: 0.3 10*3/uL (ref 0.0–0.7)
EOS PCT: 3 % (ref 0–5)
HCT: 39.9 % (ref 39.0–52.0)
HEMOGLOBIN: 13.5 g/dL (ref 13.0–17.0)
LYMPHS ABS: 1.5 10*3/uL (ref 0.7–4.0)
Lymphocytes Relative: 17 % (ref 12–46)
MCH: 31.4 pg (ref 26.0–34.0)
MCHC: 33.8 g/dL (ref 30.0–36.0)
MCV: 92.8 fL (ref 78.0–100.0)
MONO ABS: 1.1 10*3/uL — AB (ref 0.1–1.0)
MONOS PCT: 13 % — AB (ref 3–12)
NEUTROS PCT: 67 % (ref 43–77)
Neutro Abs: 5.7 10*3/uL (ref 1.7–7.7)
Platelets: 277 10*3/uL (ref 150–400)
RBC: 4.3 MIL/uL (ref 4.22–5.81)
RDW: 13.5 % (ref 11.5–15.5)
WBC: 8.6 10*3/uL (ref 4.0–10.5)

## 2014-04-29 LAB — I-STAT TROPONIN, ED: TROPONIN I, POC: 0.01 ng/mL (ref 0.00–0.08)

## 2014-04-29 LAB — BRAIN NATRIURETIC PEPTIDE: B Natriuretic Peptide: 24.6 pg/mL (ref 0.0–100.0)

## 2014-04-29 MED ORDER — AZITHROMYCIN 250 MG PO TABS: 250.0000 mg | ORAL_TABLET | Freq: Every day | ORAL | Status: DC

## 2014-04-29 MED ORDER — ALBUTEROL SULFATE (2.5 MG/3ML) 0.083% IN NEBU
5.0000 mg | INHALATION_SOLUTION | Freq: Once | RESPIRATORY_TRACT | Status: AC
  Administered 2014-04-29: 5 mg via RESPIRATORY_TRACT
  Filled 2014-04-29: qty 6

## 2014-04-29 MED ORDER — IPRATROPIUM BROMIDE 0.02 % IN SOLN
0.5000 mg | Freq: Once | RESPIRATORY_TRACT | Status: AC
  Administered 2014-04-29: 0.5 mg via RESPIRATORY_TRACT
  Filled 2014-04-29: qty 2.5

## 2014-04-29 MED ORDER — AZITHROMYCIN 250 MG PO TABS
500.0000 mg | ORAL_TABLET | Freq: Once | ORAL | Status: AC
  Administered 2014-04-29: 500 mg via ORAL
  Filled 2014-04-29: qty 2

## 2014-04-29 MED ORDER — BENZONATATE 100 MG PO CAPS: 100.0000 mg | ORAL_CAPSULE | Freq: Three times a day (TID) | ORAL | Status: DC

## 2014-04-29 NOTE — ED Notes (Signed)
EDP at bedside  

## 2014-04-29 NOTE — Discharge Instructions (Signed)
Zithromax as prescribed.   Tessalon as prescribed as needed for cough.  Continue your prednisone as prescribed.   Return to the emergency department if you develop chest pain, worsening breathing, or other new and concerning symptoms.   Acute Bronchitis Bronchitis is inflammation of the airways that extend from the windpipe into the lungs (bronchi). The inflammation often causes mucus to develop. This leads to a cough, which is the most common symptom of bronchitis.  In acute bronchitis, the condition usually develops suddenly and goes away over time, usually in a couple weeks. Smoking, allergies, and asthma can make bronchitis worse. Repeated episodes of bronchitis may cause further lung problems.  CAUSES Acute bronchitis is most often caused by the same virus that causes a cold. The virus can spread from person to person (contagious) through coughing, sneezing, and touching contaminated objects. SIGNS AND SYMPTOMS   Cough.   Fever.   Coughing up mucus.   Body aches.   Chest congestion.   Chills.   Shortness of breath.   Sore throat.  DIAGNOSIS  Acute bronchitis is usually diagnosed through a physical exam. Your health care provider will also ask you questions about your medical history. Tests, such as chest X-rays, are sometimes done to rule out other conditions.  TREATMENT  Acute bronchitis usually goes away in a couple weeks. Oftentimes, no medical treatment is necessary. Medicines are sometimes given for relief of fever or cough. Antibiotic medicines are usually not needed but may be prescribed in certain situations. In some cases, an inhaler may be recommended to help reduce shortness of breath and control the cough. A cool mist vaporizer may also be used to help thin bronchial secretions and make it easier to clear the chest.  HOME CARE INSTRUCTIONS  Get plenty of rest.   Drink enough fluids to keep your urine clear or pale yellow (unless you have a medical  condition that requires fluid restriction). Increasing fluids may help thin your respiratory secretions (sputum) and reduce chest congestion, and it will prevent dehydration.   Take medicines only as directed by your health care provider.  If you were prescribed an antibiotic medicine, finish it all even if you start to feel better.  Avoid smoking and secondhand smoke. Exposure to cigarette smoke or irritating chemicals will make bronchitis worse. If you are a smoker, consider using nicotine gum or skin patches to help control withdrawal symptoms. Quitting smoking will help your lungs heal faster.   Reduce the chances of another bout of acute bronchitis by washing your hands frequently, avoiding people with cold symptoms, and trying not to touch your hands to your mouth, nose, or eyes.   Keep all follow-up visits as directed by your health care provider.  SEEK MEDICAL CARE IF: Your symptoms do not improve after 1 week of treatment.  SEEK IMMEDIATE MEDICAL CARE IF:  You develop an increased fever or chills.   You have chest pain.   You have severe shortness of breath.  You have bloody sputum.   You develop dehydration.  You faint or repeatedly feel like you are going to pass out.  You develop repeated vomiting.  You develop a severe headache. MAKE SURE YOU:   Understand these instructions.  Will watch your condition.  Will get help right away if you are not doing well or get worse. Document Released: 04/02/2004 Document Revised: 07/10/2013 Document Reviewed: 08/16/2012 [facility] Patient Information 2015 [place], [place]. This information is not intended to replace advice given to you by your  health care provider. Make sure you discuss any questions you have with your health care provider. ° °

## 2014-04-29 NOTE — ED Notes (Signed)
The pt has had a cough for 2 weeks.  Thick white mucous.  He has soreness in his ribs from coughing.  Hx copd on home 02.  No temp

## 2014-04-29 NOTE — ED Provider Notes (Signed)
CSN: [id number]     Arrival date & time 04/29/14  1556 History   First MD Initiated Contact with Patient 04/29/14 1720     Chief Complaint  Patient presents with  . Cough     (Consider location/radiation/quality/duration/timing/severity/associated sxs/prior Treatment) HPI Comments: Patient is an 81 year old male with past medical history of COPD and hypertension. He presents with complaints of productive cough for the past 2 weeks. He reports coughing up a white mucus. He denies chest pain, but however does describe soreness in his chest from repeated coughing. He denies fevers or chills. He was prescribed prednisone by his primary doctor, however this has not helped.  Patient is a 81 y.o. male presenting with cough. The history is provided by the patient.  Cough Cough characteristics:  Productive Sputum characteristics:  White Severity:  Moderate Onset quality:  Gradual Duration:  2 weeks Timing:  Constant Progression:  Worsening Chronicity:  New Smoker: no   Relieved by:  Nothing Worsened by:  Nothing tried Ineffective treatments: Prednisone and nebulizer. Associated symptoms: no chills and no fever     Past Medical History  Diagnosis Date  . COPD (chronic obstructive pulmonary disease)   . Hypertension    History reviewed. No pertinent past surgical history. No family history on file. History  Substance Use Topics  . Smoking status: Former Research scientist (life sciences)  . Smokeless tobacco: Not on file  . Alcohol Use: No    Review of Systems  Constitutional: Negative for fever and chills.  Respiratory: Positive for cough.   All other systems reviewed and are negative.     Allergies  Sulfa antibiotics  Home Medications   Prior to Admission medications   Not on File   BP 142/62 mmHg  Pulse 102  Temp(Src) 97.4 F (36.3 C) (Oral)  Resp 24  SpO2 93% Physical Exam  Constitutional: He is oriented to person, place, and time. He appears well-developed and well-nourished. No  distress.  HENT:  Head: Normocephalic and atraumatic.  Mouth/Throat: Oropharynx is clear and moist.  Neck: Normal range of motion. Neck supple.  Cardiovascular: Normal rate, regular rhythm and normal heart sounds.   No murmur heard. Pulmonary/Chest: Effort normal and breath sounds normal. No respiratory distress. He has no wheezes. He has no rales.  Abdominal: Soft. Bowel sounds are normal. He exhibits no distension. There is no tenderness.  Musculoskeletal: Normal range of motion. He exhibits no edema.  Neurological: He is alert and oriented to person, place, and time.  Skin: Skin is warm and dry. He is not diaphoretic.  Nursing note and vitals reviewed.   ED Course  Procedures (including critical care time) Labs Review Labs Reviewed  CBC WITH DIFFERENTIAL/PLATELET  COMPREHENSIVE METABOLIC PANEL  I-STAT TROPOININ, ED    Imaging Review Dg Chest 2 View  04/29/2014   CLINICAL DATA:  Cough and severe chest congestion for the past 2 weeks.  EXAM: CHEST  2 VIEW  COMPARISON:  None.  FINDINGS: Normal sized heart. The lungs are hyperexpanded with diffuse prominent interstitial markings. Wedge-shaped area of increased density at the lung bases anteriorly on the lateral view. There is corresponding obscuration of the inferior right and left heart borders and asymmetrical increased density at the medial right lung base on the frontal view. Bilateral calcified granulomata. Mild thoracic spine degenerative changes.  IMPRESSION: 1. Probable dense atelectasis in the right middle lobe and possibly lingula. 2. COPD.   Electronically Signed   By: Claudie Revering M.D.   On: 04/29/2014 16:45  EKG Interpretation   Date/Time:  Sunday April 29 2014 17:48:11 EST Ventricular Rate:  83 PR Interval:  161 QRS Duration: 91 QT Interval:  382 QTC Calculation: 449 R Axis:   78 Text Interpretation:  Sinus rhythm Baseline wander in lead(s) V3 Confirmed  by DELOS  MD, Deniqua Perry ([id number]) on 04/29/2014 5:53:11  PM      MDM   Final diagnoses:  None    Patient is an 81 year old male with history of hypertension and COPD. He presents with productive cough for the past 2 weeks. His workup reveals no evidence for a cardiac etiology, but does show atelectasis in the right middle lobe. This is at least bronchitis and possibly pneumonia. He will be treated with antibiotics continued steroids, and cough medication. He is to return as needed for any problems.    Veryl Speak, MD 04/29/14 [phone]

## 2014-05-03 DIAGNOSIS — R319 Hematuria, unspecified: Secondary | ICD-10-CM | POA: Diagnosis not present

## 2014-05-04 ENCOUNTER — Encounter: Payer: Self-pay | Admitting: Internal Medicine

## 2014-05-10 DIAGNOSIS — I10 Essential (primary) hypertension: Secondary | ICD-10-CM | POA: Diagnosis not present

## 2014-05-10 DIAGNOSIS — J060 Acute laryngopharyngitis: Secondary | ICD-10-CM | POA: Diagnosis not present

## 2014-05-10 DIAGNOSIS — E782 Mixed hyperlipidemia: Secondary | ICD-10-CM | POA: Diagnosis not present

## 2014-05-10 DIAGNOSIS — R05 Cough: Secondary | ICD-10-CM | POA: Diagnosis not present

## 2014-05-16 ENCOUNTER — Encounter: Payer: Self-pay | Admitting: Internal Medicine

## 2014-05-16 ENCOUNTER — Ambulatory Visit (INDEPENDENT_AMBULATORY_CARE_PROVIDER_SITE_OTHER): Payer: Medicare Other | Admitting: Internal Medicine

## 2014-05-16 VITALS — BP 130/80 | HR 90 | Ht 67.0 in | Wt 171.0 lb

## 2014-05-16 DIAGNOSIS — R05 Cough: Secondary | ICD-10-CM | POA: Diagnosis not present

## 2014-05-16 DIAGNOSIS — J449 Chronic obstructive pulmonary disease, unspecified: Secondary | ICD-10-CM | POA: Diagnosis not present

## 2014-05-16 DIAGNOSIS — I10 Essential (primary) hypertension: Secondary | ICD-10-CM | POA: Diagnosis not present

## 2014-05-16 DIAGNOSIS — R058 Other specified cough: Secondary | ICD-10-CM

## 2014-05-16 DIAGNOSIS — R059 Cough, unspecified: Secondary | ICD-10-CM

## 2014-05-16 MED ORDER — PREDNISONE 10 MG PO TABS: ORAL_TABLET | ORAL | Status: DC

## 2014-05-16 MED ORDER — PANTOPRAZOLE SODIUM 40 MG PO TBEC: 40.0000 mg | DELAYED_RELEASE_TABLET | Freq: Every day | ORAL | Status: DC

## 2014-05-16 MED ORDER — FAMOTIDINE 20 MG PO TABS: ORAL_TABLET | ORAL | Status: DC

## 2014-05-16 NOTE — Progress Notes (Signed)
Subjective:     Patient ID: Trevor Pitts, male   DOB: 1924/07/07  MRN: [id number]   Brief patient profile:   89  yowm quit smoking around 1970 due to Vocal cord problems per ENT but no residual symptoms then  but dx GOLD III documented 03/2009.    History of Present Illness  February 13, 2009 cc variable sob x 10 years with PFT's at [facility] but doesn't remember when gradually worse to point where  needs to go slow and wear 02. Also cough more daytime clear mucus with occasional sensation of throat closing up, mucus getting stuck at level of suprasternal notch and choking him. rec stop benazepril  start benicar 40 mg one daily  Stop fish oil until no longer coughing, then ok to take it again    Admit date: 03/22/2011  Discharge date: 03/25/2011   Discharge Diagnoses:  1-COPD exacerbation 2-CAP (community acquired pneumonia) 3-HYPERLIPIDEMIA 4-HYPERTENSION 5-Dysphagia  6-GERD     11/02/2013   ov/Wert re:  02 2lpm ex and sleeping / GOLD III COPD  Chief Complaint  Patient presents with  . Follow-up    Pt c/o increased DOE for the past wk. He also c/o sinus pressure and HA.   doe assoc with worse nasal congestion slt better with nasacort otc, has not tried decongestants, no purulent secretions.  worse in am, onset fairly abrupt p beach trip by car  rec Prednisone 10 mg take  4 each am x 2 days,   2 each am x 2 days,  1 each am x 2 days and stop  I emphasized that nasal steroids have no immediate benefit .      12/14/2013 f/u ov/Wert re: copd GOLD III on Brov/bud bid  Chief Complaint  Patient presents with  . Follow-up    PFT done today. Breathing is unchanged since the last visit.    mb to house s stopping, no need for saba  Main complaint is variable urinary hesitance/burning x sev weeks on cardura 2 mg daily and being followed for this by urology and primary care  rec Please remember to go to the lab and x-ray department downstairs for your tests - we will call you with the  results when they are available. Increase cardura to 2 at bedtime until your bladder problem is better  Add consider lama next ov but make sure bladder better     01/18/2014 f/u ov/Wert re:  GOLD III   Chief Complaint  Patient presents with  . Follow-up    Pt states that his breathing is much improved since the last visit. He states not having any more chest tightness. Cough is about the same- "I always had a little dry cough". He has not used rescue inhaler since the last visit.   rec Please schedule a follow up visit in 3 months but call sooner if needed with all active medications in hand including inhalers and neb solutions    04/18/2014 f/u ov/Wert re: GOLD III COPD/ revealed on ACEi since Dr Pauline Aus gave it to him ? When  Chief Complaint  Patient presents with  . Acute Visit    Pt c/o increased cough for the past 3 days. He also c/o more SOB than usual.   No 02 sleeping or sitting/ does mb to house s stopping s 02  Cough is worse than usual but has been present x months, dry, daytime assoc with hoarseness on ACEi chronically unbeknownst to me  rec Stop benazapril  Avapro 150 mg one at bedtime instead of benazapril    05/16/2014 f/u ov/Wert re: cough off acei x 4 weeks some better  Chief Complaint  Patient presents with  . Follow-up    Pt states that his cough is some better since the last visit. His cough is prod with thick, clear sputum.  He states that his breathing has improved.   wears 02 with housework/ [employer]  Mailbox and back is good  Tickle in throat leads to coughing fits Worse first thing in am but not excess/purulent  mucus production    No obvious other day to day or daytime variabilty or assoc chronic cough or cp or chest tightness, subjective wheeze overt sinus or hb symptoms. No unusual exp hx or h/o childhood pna/ asthma or knowledge of premature birth.  Sleeping ok without nocturnal  or early am exacerbation  of respiratory  c/o's or need for noct saba. Also  denies any obvious fluctuation of symptoms with weather or environmental changes or other aggravating or alleviating factors except as outlined above   Current Medications, Allergies, Complete Past Medical History, Past Surgical History, Family History, and Social History were reviewed in [employer] record.  ROS  The following are not active complaints unless bolded sore throat, dysphagia, dental problems, itching, sneezing,  nasal congestion or excess/ purulent secretions, ear ache,   fever, chills, sweats, unintended wt loss, pleuritic or exertional cp, hemoptysis,  orthopnea pnd or leg swelling, presyncope, palpitations, heartburn, abdominal pain, anorexia, nausea, vomiting, diarrhea  or change  urinary habits, change in stools or urine, dysuria,hematuria,  rash, arthralgias, visual complaints, headache, numbness weakness or ataxia or problems with walking or coordination,  change in mood/affect or memory.         Past Medical History:  C O P D.........................................................Marland KitchenWert  - PFT's March 15, 2009 FEV1 1.05 (44%) ratio 39 with 14% better after B2 and DLC048%  Hyperlipidemia  Hypertension       Objective:   Physical Exam  Wt 174 February 13, 2009 >  > 177 March 30, 2010 > 177 04/16/2011 > 07/22/2011  176> 07/27/12  173 > 08/19/2012 173 >173 04/28/2013  > 11/02/2013 171 > 12/14/2013  170  01/18/2014  172 > 04/18/2014  168 > 05/16/2014    171   In general robust pleasant amb wm nad   HEENT mod bilateral nonspecific edema of  turbinates. Oropharynx no thrush or excess pnd or cobblestoning. No JVD or cervical adenopathy. Mild accessory muscle hypertrophy. Trachea midline, nl thryroid. Chest   Completely clear to A and P  bilaterally   Regular rate and rhythm without murmur gallop or rub or increase P2 or edema. Abd: no hsm, nl excursion. Ext warm without cyanosis or clubbing.     CXR  12/14/2013 : Chronic lung disease with basilar fibrosis and  calcified granulomata. No change from the prior study.         Assessment:

## 2014-05-16 NOTE — Patient Instructions (Addendum)
For cough try mucinex dm up to 1200 mg every 12 hours as needed   Prednisone 10 mg take  4 each am x 2 days,   2 each am x 2 days,  1 each am x 2 days and stop  (called in)   Please see patient coordinator before you leave today  to schedule sinus ct  Reduce the budesonide to one half dose twice daily for now   Pantoprazole (protonix) 40 mg   Take 30-60 min before first meal of the day and Pepcid 20 mg one bedtime until return to office - this is the best way to tell whether stomach acid is contributing to your problem.    GERD (REFLUX)  is an extremely common cause of respiratory symptoms just like yours , many times with no obvious heartburn at all.    It can be treated with medication, but also with lifestyle changes including avoidance of late meals, excessive alcohol, smoking cessation, and avoid fatty foods, chocolate, peppermint, colas, red wine, and acidic juices such as orange juice.  NO MINT OR MENTHOL PRODUCTS SO NO COUGH DROPS  USE SUGARLESS CANDY INSTEAD (Jolley ranchers or Stover's or Life Savers) or even ice chips will also do - the key is to swallow to prevent all throat clearing. NO OIL BASED VITAMINS - use powdered substitutes.    See Tammy NP w/in 2 weeks with all your medications, even over the counter meds, separated in two separate bags, the ones you take no matter what vs the ones you stop once you feel better and take only as needed when you feel you need them.   Tammy  will generate for you a new user friendly medication calendar that will put [place] all on the same page re: your medication use.     Without this process, it simply isn't possible to assure that we are providing  your outpatient care  with  the attention to detail we feel you deserve.   If we cannot assure that you're getting that kind of care,  then we cannot manage your problem effectively from this clinic.  Once you have seen Tammy and we are sure that we're all on the same page with your medication use  she will arrange follow up with me.

## 2014-05-17 ENCOUNTER — Encounter: Payer: Self-pay | Admitting: Internal Medicine

## 2014-05-17 NOTE — Assessment & Plan Note (Signed)
Classic Upper airway cough syndrome, so named because it's frequently impossible to sort out how much is  CR/sinusitis with freq throat clearing (which can be related to primary GERD)   vs  causing  secondary (" extra esophageal")  GERD from wide swings in gastric pressure that occur with throat clearing, often  promoting self use of mint and menthol lozenges that reduce the lower esophageal sphincter tone and exacerbate the problem further in a cyclical fashion.   These are the same pts (now being labeled as having "irritable larynx syndrome" by some cough centers) who not infrequently have a history of having failed to tolerate ace inhibitors,  dry powder inhalers or biphosphonates or report having atypical reflux symptoms that don't respond to standard doses of PPI , and are easily confused as having aecopd or asthma flares by even experienced allergists/ pulmonologists.   rec continue off acei/ add max gerd rx and check sinus CT to complete the w/u      Each maintenance medication was reviewed in detail including most importantly the difference between maintenance and as needed and under what circumstances the prns are to be used.  Please see instructions for details which were reviewed in writing and the patient given a copy.

## 2014-05-17 NOTE — Assessment & Plan Note (Signed)
 -    Trial off acei 04/18/2014 >>>   Adequate control on present rx, reviewed > no change in rx needed  = avapro 150 mg daily

## 2014-05-17 NOTE — Assessment & Plan Note (Signed)
-   PFT's March 15, 2009 FEV1 1.20 (51%) ratio 37 p 14% from  saba  and DLC048%  -  PFT's  12/14/2013          FEV1 0.99 (45%) ratio 48 p 8%  from saba and DLCO 35 p am neb laba neb   -  Trial off acei 04/18/2014 >>>    His chest is as clear as it's been and the main remainin issue is the upper airway cough that started while on surreptitiously taking ACEi > needs to remain off indefinitely  Ok to reduce dose of pulmocort by half at this point

## 2014-05-21 DIAGNOSIS — H43813 Vitreous degeneration, bilateral: Secondary | ICD-10-CM | POA: Diagnosis not present

## 2014-05-21 DIAGNOSIS — H35032 Hypertensive retinopathy, left eye: Secondary | ICD-10-CM | POA: Diagnosis not present

## 2014-05-21 DIAGNOSIS — H34231 Retinal artery branch occlusion, right eye: Secondary | ICD-10-CM | POA: Diagnosis not present

## 2014-05-21 DIAGNOSIS — H3531 Nonexudative age-related macular degeneration: Secondary | ICD-10-CM | POA: Diagnosis not present

## 2014-05-22 ENCOUNTER — Ambulatory Visit (INDEPENDENT_AMBULATORY_CARE_PROVIDER_SITE_OTHER)
Admission: RE | Admit: 2014-05-22 | Discharge: 2014-05-22 | Disposition: A | Discharge status: Home / Self Care | Payer: Medicare Other | Source: Ambulatory Visit | Attending: Internal Medicine | Admitting: Internal Medicine

## 2014-05-22 DIAGNOSIS — R05 Cough: Secondary | ICD-10-CM | POA: Diagnosis not present

## 2014-05-22 DIAGNOSIS — R059 Cough, unspecified: Secondary | ICD-10-CM

## 2014-05-22 DIAGNOSIS — J3489 Other specified disorders of nose and nasal sinuses: Secondary | ICD-10-CM | POA: Diagnosis not present

## 2014-05-22 NOTE — Progress Notes (Signed)
Quick Note:  Spoke with pt and notified of results per Dr. Wert. Pt verbalized understanding and denied any questions.  ______ 

## 2014-05-30 ENCOUNTER — Encounter: Payer: Self-pay | Admitting: Adult Health

## 2014-05-30 ENCOUNTER — Ambulatory Visit (INDEPENDENT_AMBULATORY_CARE_PROVIDER_SITE_OTHER): Payer: Medicare Other | Admitting: Adult Health

## 2014-05-30 VITALS — BP 144/82 | HR 83 | Temp 97.6°F | Ht 67.0 in | Wt 170.8 lb

## 2014-05-30 DIAGNOSIS — J9611 Chronic respiratory failure with hypoxia: Secondary | ICD-10-CM | POA: Diagnosis not present

## 2014-05-30 DIAGNOSIS — J449 Chronic obstructive pulmonary disease, unspecified: Secondary | ICD-10-CM

## 2014-05-30 NOTE — Assessment & Plan Note (Signed)
Cont on O2 .  

## 2014-05-30 NOTE — Patient Instructions (Signed)
Continue on current regimen  Follow med calendar closely and bring to each visit.  follow up Dr. [name]  In 3 months and As needed

## 2014-05-30 NOTE — Assessment & Plan Note (Signed)
Slow to resolve flare with cough  Improved off ACE and GERD tx  Use pepcid in place of PPI d/t cose.   Plan  Continue on current regimen  Follow med calendar closely and bring to each visit.  follow up Dr. [name]  In 3 months and As needed

## 2014-05-30 NOTE — Progress Notes (Signed)
Subjective:     Patient ID: Trevor Pitts, male   DOB: January 18, 1925  MRN: [id number]   Brief patient profile:   89  yowm quit smoking around 1970 due to Vocal cord problems per ENT but no residual symptoms then  but dx GOLD III documented 03/2009.    History of Present Illness  February 13, 2009 cc variable sob x 10 years with PFT's at [facility] but doesn't remember when gradually worse to point where  needs to go slow and wear 02. Also cough more daytime clear mucus with occasional sensation of throat closing up, mucus getting stuck at level of suprasternal notch and choking him. rec stop benazepril  start benicar 40 mg one daily  Stop fish oil until no longer coughing, then ok to take it again    Admit date: 03/22/2011  Discharge date: 03/25/2011   Discharge Diagnoses:  1-COPD exacerbation 2-CAP (community acquired pneumonia) 3-HYPERLIPIDEMIA 4-HYPERTENSION 5-Dysphagia  6-GERD     11/02/2013   ov/Wert re:  02 2lpm ex and sleeping / GOLD III COPD  Chief Complaint  Patient presents with  . Follow-up    Pt c/o increased DOE for the past wk. He also c/o sinus pressure and HA.   doe assoc with worse nasal congestion slt better with nasacort otc, has not tried decongestants, no purulent secretions.  worse in am, onset fairly abrupt p beach trip by car  rec Prednisone 10 mg take  4 each am x 2 days,   2 each am x 2 days,  1 each am x 2 days and stop  I emphasized that nasal steroids have no immediate benefit .      12/14/2013 f/u ov/Wert re: copd GOLD III on Brov/bud bid  Chief Complaint  Patient presents with  . Follow-up    PFT done today. Breathing is unchanged since the last visit.    mb to house s stopping, no need for saba  Main complaint is variable urinary hesitance/burning x sev weeks on cardura 2 mg daily and being followed for this by urology and primary care  rec Please remember to go to the lab and x-ray department downstairs for your tests - we will call you with the  results when they are available. Increase cardura to 2 at bedtime until your bladder problem is better  Add consider lama next ov but make sure bladder better     01/18/2014 f/u ov/Wert re:  GOLD III   Chief Complaint  Patient presents with  . Follow-up    Pt states that his breathing is much improved since the last visit. He states not having any more chest tightness. Cough is about the same- "I always had a little dry cough". He has not used rescue inhaler since the last visit.   rec Please schedule a follow up visit in 3 months but call sooner if needed with all active medications in hand including inhalers and neb solutions    04/18/2014 f/u ov/Wert re: GOLD III COPD/ revealed on ACEi since Dr Pauline Aus gave it to him ? When  Chief Complaint  Patient presents with  . Acute Visit    Pt c/o increased cough for the past 3 days. He also c/o more SOB than usual.   No 02 sleeping or sitting/ does mb to house s stopping s 02  Cough is worse than usual but has been present x months, dry, daytime assoc with hoarseness on ACEi chronically unbeknownst to me  rec Stop benazapril  Avapro 150 mg one at bedtime instead of benazapril    05/16/2014 f/u ov/Wert re: cough off acei x 4 weeks some better  Chief Complaint  Patient presents with  . Follow-up    Pt states that his cough is some better since the last visit. His cough is prod with thick, clear sputum.  He states that his breathing has improved.   wears 02 with housework/ [employer]  Mailbox and back is good  Tickle in throat leads to coughing fits Worse first thing in am but not excess/purulent  mucus production  >pred taper , neg CT sinus     05/30/2014 Follow up and Med Review: COPD /Cough  Pt returns for follow up and med review  We reviewed all his meds and organized them into a med calendar with pt education.  Seen last ov tx w/ pred taper. Set up for CT sinus ,neg.  Cough is nearly resolved.  Can not tolerate protonix d/t leg cramps  and too expensive.  Has not prescription insurance.  No fever, chest pain, orthopnea, or edema.      Current Medications, Allergies, Complete Past Medical History, Past Surgical History, Family History, and Social History were reviewed in [employer] record.  ROS  The following are not active complaints unless bolded sore throat, dysphagia, dental problems, itching, sneezing,   or excess/ purulent secretions, ear ache,   fever, chills, sweats, unintended wt loss, pleuritic or exertional cp, hemoptysis,  orthopnea pnd or leg swelling, presyncope, palpitations, heartburn, abdominal pain, anorexia, nausea, vomiting, diarrhea  or change  urinary habits, change in stools or urine, dysuria,hematuria,  rash, arthralgias, visual complaints, headache, numbness weakness or ataxia or problems with walking or coordination,  change in mood/affect or memory.         Past Medical History:  C O P D.........................................................Marland KitchenWert  - PFT's March 15, 2009 FEV1 1.05 (44%) ratio 39 with 14% better after B2 and DLC048%  Hyperlipidemia  Hypertension       Objective:   Physical Exam  Wt 174 February 13, 2009 >  > 177 March 30, 2010 > 177 04/16/2011 > 07/22/2011  176> 07/27/12  173 > 08/19/2012 173 >173 04/28/2013  > 11/02/2013 171 > 12/14/2013  170  01/18/2014  172 > 04/18/2014  168 > 05/16/2014    171 >  05/30/2014 170   In general robust pleasant amb wm nad   HEENT  Oropharynx no thrush or excess pnd or cobblestoning. No JVD or cervical adenopathy. Mild accessory muscle hypertrophy. Trachea midline, nl thryroid. Chest   Completely clear to A and P  bilaterally   Regular rate and rhythm without murmur gallop or rub or increase P2 or edema. Abd: no hsm, nl excursion. Ext warm without cyanosis or clubbing.     CXR  12/14/2013 : Chronic lung disease with basilar fibrosis and calcified granulomata. No change from the prior study.         Assessment:

## 2014-06-15 NOTE — Addendum Note (Signed)
Addended by: Parke Poisson E on: 06/15/2014 01:33 PM   Modules accepted: Orders, Medications

## 2014-06-28 DIAGNOSIS — L820 Inflamed seborrheic keratosis: Secondary | ICD-10-CM | POA: Diagnosis not present

## 2014-06-28 DIAGNOSIS — L300 Nummular dermatitis: Secondary | ICD-10-CM | POA: Diagnosis not present

## 2014-07-09 DIAGNOSIS — I493 Ventricular premature depolarization: Secondary | ICD-10-CM | POA: Diagnosis not present

## 2014-07-09 DIAGNOSIS — R0602 Shortness of breath: Secondary | ICD-10-CM | POA: Diagnosis not present

## 2014-07-09 DIAGNOSIS — J441 Chronic obstructive pulmonary disease with (acute) exacerbation: Secondary | ICD-10-CM | POA: Diagnosis not present

## 2014-07-09 DIAGNOSIS — J9611 Chronic respiratory failure with hypoxia: Secondary | ICD-10-CM | POA: Diagnosis not present

## 2014-08-20 DIAGNOSIS — H34231 Retinal artery branch occlusion, right eye: Secondary | ICD-10-CM | POA: Diagnosis not present

## 2014-08-20 DIAGNOSIS — H35032 Hypertensive retinopathy, left eye: Secondary | ICD-10-CM | POA: Diagnosis not present

## 2014-08-20 DIAGNOSIS — H3531 Nonexudative age-related macular degeneration: Secondary | ICD-10-CM | POA: Diagnosis not present

## 2014-08-20 DIAGNOSIS — H43813 Vitreous degeneration, bilateral: Secondary | ICD-10-CM | POA: Diagnosis not present

## 2014-08-28 ENCOUNTER — Encounter: Payer: Self-pay | Admitting: Internal Medicine

## 2014-08-28 ENCOUNTER — Telehealth: Payer: Self-pay | Admitting: Internal Medicine

## 2014-08-28 ENCOUNTER — Ambulatory Visit (INDEPENDENT_AMBULATORY_CARE_PROVIDER_SITE_OTHER): Payer: Medicare Other | Admitting: Internal Medicine

## 2014-08-28 VITALS — BP 118/70 | HR 82 | Ht 67.0 in | Wt 167.0 lb

## 2014-08-28 DIAGNOSIS — J9611 Chronic respiratory failure with hypoxia: Secondary | ICD-10-CM

## 2014-08-28 DIAGNOSIS — R058 Other specified cough: Secondary | ICD-10-CM

## 2014-08-28 DIAGNOSIS — I10 Essential (primary) hypertension: Secondary | ICD-10-CM | POA: Diagnosis not present

## 2014-08-28 DIAGNOSIS — R05 Cough: Secondary | ICD-10-CM | POA: Diagnosis not present

## 2014-08-28 DIAGNOSIS — J449 Chronic obstructive pulmonary disease, unspecified: Secondary | ICD-10-CM | POA: Diagnosis not present

## 2014-08-28 MED ORDER — ALBUTEROL SULFATE (2.5 MG/3ML) 0.083% IN NEBU: 2.5000 mg | INHALATION_SOLUTION | Freq: Four times a day (QID) | RESPIRATORY_TRACT | Status: DC | PRN

## 2014-08-28 NOTE — Patient Instructions (Signed)
Please see patient coordinator before you leave today  to schedule ono RA but ok to leave off 02 at night for now  Only use your albuterol as a rescue medication to be used if you can't catch your breath by resting or doing a relaxed purse lip breathing pattern.  - The less you use it, the better it will work when you need it. - Ok to use up to one vial (2.5mg ) every 4 hours if you must but call for immediate appointment if use goes up over your usual need - Don't leave home without it !!  (think of it like the spare tire for your car)     If you are satisfied with your treatment plan,  let your doctor know and he/she can either refill your medications or you can return here when your prescription runs out.     If in any way you are not 100% satisfied,  please tell us.  If 100% better, tell your friends!  Pulmonary follow up is as needed

## 2014-08-28 NOTE — Progress Notes (Addendum)
Subjective:     Patient ID: Trevor Pitts, male   DOB: October 28, 1924  MRN: [id number]   Brief patient profile:   89  yowm quit smoking around 1970 due to Vocal cord problems per ENT but no residual symptoms then  but dx GOLD III  COPD documented 03/2009.    History of Present Illness  February 13, 2009 cc variable sob x 10 years with PFT's at [facility] but doesn't remember when gradually worse to point where  needs to go slow and wear 02. Also cough more daytime clear mucus with occasional sensation of throat closing up, mucus getting stuck at level of suprasternal notch and choking him. rec stop benazepril  start benicar 40 mg one daily  Stop fish oil until no longer coughing, then ok to take it again    Admit date: 03/22/2011  Discharge date: 03/25/2011   Discharge Diagnoses:  1-COPD exacerbation 2-CAP (community acquired pneumonia) 3-HYPERLIPIDEMIA 4-HYPERTENSION 5-Dysphagia  6-GERD     11/02/2013   ov/Jacorie Ernsberger re:  02 2lpm ex and sleeping / GOLD III COPD  Chief Complaint  Patient presents with  . Follow-up    Pt c/o increased DOE for the past wk. He also c/o sinus pressure and HA.   doe assoc with worse nasal congestion slt better with nasacort otc, has not tried decongestants, no purulent secretions.  worse in am, onset fairly abrupt p beach trip by car  rec Prednisone 10 mg take  4 each am x 2 days,   2 each am x 2 days,  1 each am x 2 days and stop  I emphasized that nasal steroids have no immediate benefit .      04/18/2014 f/u ov/Lonzell Dorris re: GOLD III COPD/ revealed on ACEi since Dr Pauline Aus gave it to him ? When  Chief Complaint  Patient presents with  . Acute Visit    Pt c/o increased cough for the past 3 days. He also c/o more SOB than usual.   No 02 sleeping or sitting/ does mb to house s stopping s 02  Cough is worse than usual but has been present x months, dry, daytime assoc with hoarseness on ACEi chronically unbeknownst to me  rec Stop benazapril  Avapro 150 mg one at  bedtime instead of benazapril    05/16/2014 f/u ov/Kashif Pooler re: cough off acei x 4 weeks some better  Chief Complaint  Patient presents with  . Follow-up    Pt states that his cough is some better since the last visit. His cough is prod with thick, clear sputum.  He states that his breathing has improved.   wears 02 with housework/ [employer]  Mailbox and back is good  Tickle in throat leads to coughing fits Worse first thing in am but not excess/purulent  mucus production  >pred taper ,   CT sinus > negative      08/28/2014 f/u ov/Climmie Cronce re: GOLD III copd/ not following med calendar / stopped 02 on his own though supposed to be using qhs Chief Complaint  Patient presents with  . Follow-up    Pt states that his breathing is unchanged. He denies any new co's.   mail box and back is fine Throat clearing variable / after eat or talking/ better with jolly ranchers   Not limited by breathing from desired activities  On laba/ics neb and wants alb for prn use but not able to use hfa effectively / has portable neb  No obvious day to day or daytime  variabilty or assoc chronic cough or cp or chest tightness, subjective wheeze overt sinus or hb symptoms. No unusual exp hx or h/o childhood pna/ asthma or knowledge of premature birth.  Sleeping ok without nocturnal  or early am exacerbation  of respiratory  c/o's or need for noct saba. Also denies any obvious fluctuation of symptoms with weather or environmental changes or other aggravating or alleviating factors except as outlined above   Current Medications, Allergies, Complete Past Medical History, Past Surgical History, Family History, and Social History were reviewed in [employer] record.  ROS  The following are not active complaints unless bolded sore throat, dysphagia, dental problems, itching, sneezing,  nasal congestion or excess/ purulent secretions, ear ache,   fever, chills, sweats, unintended wt loss, pleuritic or  exertional cp, hemoptysis,  orthopnea pnd or leg swelling, presyncope, palpitations, abdominal pain, anorexia, nausea, vomiting, diarrhea  or change in bowel or urinary habits, change in stools or urine, dysuria,hematuria,  rash, arthralgias, visual complaints, headache, numbness weakness or ataxia or problems with walking or coordination,  change in mood/affect or memory.                 Past Medical History:  [facility].........................................................Marland KitchenWert  - PFT's March 15, 2009 FEV1 1.05 (44%) ratio 39 with 14% better after B2 and DLC048%  Hyperlipidemia  Hypertension       Objective:   Physical Exam  Wt 174 February 13, 2009 >  > 177 March 30, 2010 > 177 04/16/2011 > 07/22/2011  176> 07/27/12  173 > 08/19/2012 173 >173 04/28/2013  > 11/02/2013 171 > 12/14/2013  170  01/18/2014  172 > 04/18/2014  168 > 05/16/2014    171 >  05/30/2014 170 >  08/28/2014 167   In general robust pleasant amb wm nad   HEENT  Oropharynx no thrush or excess pnd or cobblestoning. No JVD or cervical adenopathy. Mild accessory muscle hypertrophy. Trachea midline, nl thryroid. Chest   Completely clear to A and P  bilaterally   Regular rate and rhythm without murmur gallop or rub or increase P2 or edema. Abd: no hsm, nl excursion. Ext warm without cyanosis or clubbing.               Assessment:         Outpatient Encounter Prescriptions as of 08/28/2014  Medication Sig  . arformoterol (BROVANA) 15 MCG/2ML NEBU Take 15 mcg by nebulization 2 (two) times daily.  Marland Kitchen aspirin 325 MG tablet Take 325 mg by mouth daily.  Marland Kitchen atorvastatin (LIPITOR) 80 MG tablet Take 40 mg by mouth every other day.  . budesonide (PULMICORT) 0.5 MG/2ML nebulizer solution Take 0.5 mg by nebulization 2 (two) times daily.  . calcium-vitamin D (OSCAL WITH D) 500-200 MG-UNIT per tablet Take 1 tablet by mouth 2 (two) times daily.   Marland Kitchen dextromethorphan-guaiFENesin (MUCINEX DM) 30-600 MG per 12 hr tablet Take 1 tablet by  mouth 2 (two) times daily as needed (cough, congestion).  . famotidine (PEPCID) 20 MG tablet Take 20 mg by mouth 2 (two) times daily.  . hydrochlorothiazide (HYDRODIURIL) 25 MG tablet Take 25 mg by mouth daily.  . irbesartan (AVAPRO) 150 MG tablet Take 1 tablet (150 mg total) by mouth daily.  . Multiple Vitamin (MULTIVITAMIN) capsule Take 1 capsule by mouth daily.  . Multiple Vitamins-Minerals (ICAPS MV) TABS Take 1 tablet by mouth 2 (two) times daily.  . OXYGEN-HELIUM IN Pt uses o2 2lpm with exertion as needed- [employer]  .  oxymetazoline (AFRIN) 0.05 % nasal spray Place 1 spray into both nostrils 2 (two) times daily as needed for congestion (for 3 days only).   . Probiotic CAPS Take 1 capsule by mouth daily.  . psyllium (METAMUCIL) 58.6 % packet 1 tbsp by mouth once daily  . Triamcinolone Acetonide (NASACORT ALLERGY 24HR NA) Place 2 sprays into the nose daily.  Marland Kitchen albuterol (PROVENTIL) (2.5 MG/3ML) 0.083% nebulizer solution Take 3 mLs (2.5 mg total) by nebulization every 6 (six) hours as needed for wheezing or shortness of breath.   No facility-administered encounter medications on file as of 08/28/2014.

## 2014-08-28 NOTE — Telephone Encounter (Signed)
Called carol and made her aware. Nothing further needed

## 2014-08-28 NOTE — Telephone Encounter (Signed)
Arbie Cookey called back - [phone]

## 2014-08-28 NOTE — Telephone Encounter (Signed)
Per 08/28/14 OV w/ MW: Patient Instructions     Please see patient coordinator before you leave today to schedule ono RA but ok to leave off 02 at night for now  --  lmomtcb x1 for Trevor Pitts

## 2014-08-29 NOTE — Assessment & Plan Note (Signed)
Trial off acei 04/19/2014 > improved  He still has tendency to excessive throat clearing only in daytime/ better with candy, typical of irritable larynx/ would not rechallenge with acei in this setting/ f/u with primary care

## 2014-08-29 NOTE — Assessment & Plan Note (Signed)
Baseline rx 2lpm with exertion and at hs. - 08/28/2014   Walked RA x one lap @ 185 stopped due to  88%  - ono RA 08/28/2014 >>>   Discussed in detail all the  indications, usual  risks and alternatives  relative to the benefits with patient who declines 02 but rec he at least check ono on RA for baseline first

## 2014-08-29 NOTE — Assessment & Plan Note (Signed)
Off acei 04/18/14 - Added max gerd rx 05/16/14  - Sinus CT 05/22/2014 > neg  Much better overall /  Controls with jolly ranchers/ could try neurontin 100 tid if enough of any issue but for now rx conservatively

## 2014-08-29 NOTE — Assessment & Plan Note (Signed)
-   PFT's March 15, 2009 FEV1 1.20 (51%) ratio 37 p 14% from  saba  and DLC048%  -  PFT's  12/14/2013          FEV1 0.99 (45%) ratio 48 p 8%  from saba and DLCO 35 p am neb laba neb   -  Trial off acei 04/18/2014 >>>  I had an extended final summary discussion with the patient reviewing all relevant studies completed to date and  lasting 15 to 20 minutes of a 25 minute visit on the following issues:    He has severe copd but well compensated on present rx   Each maintenance medication was reviewed in detail including most importantly the difference between maintenance and as needed and under what circumstances the prns are to be used. This was done in the context of a medication calendar review which provided the patient with a user-friendly unambiguous mechanism for medication administration and reconciliation and provides an action plan for all active problems. It is critical that this be shown to every doctor  for modification during the office visit if necessary so the patient can use it as a working document.

## 2014-09-04 DIAGNOSIS — J9611 Chronic respiratory failure with hypoxia: Secondary | ICD-10-CM | POA: Diagnosis not present

## 2014-09-18 DIAGNOSIS — R062 Wheezing: Secondary | ICD-10-CM | POA: Diagnosis not present

## 2014-09-18 DIAGNOSIS — R05 Cough: Secondary | ICD-10-CM | POA: Diagnosis not present

## 2014-09-18 DIAGNOSIS — R0602 Shortness of breath: Secondary | ICD-10-CM | POA: Diagnosis not present

## 2014-09-18 DIAGNOSIS — J441 Chronic obstructive pulmonary disease with (acute) exacerbation: Secondary | ICD-10-CM | POA: Diagnosis not present

## 2014-09-27 DIAGNOSIS — H34231 Retinal artery branch occlusion, right eye: Secondary | ICD-10-CM | POA: Diagnosis not present

## 2014-10-11 DIAGNOSIS — I2510 Atherosclerotic heart disease of native coronary artery without angina pectoris: Secondary | ICD-10-CM | POA: Diagnosis not present

## 2014-10-11 DIAGNOSIS — E782 Mixed hyperlipidemia: Secondary | ICD-10-CM | POA: Diagnosis not present

## 2014-10-11 DIAGNOSIS — I10 Essential (primary) hypertension: Secondary | ICD-10-CM | POA: Diagnosis not present

## 2014-10-15 DIAGNOSIS — R609 Edema, unspecified: Secondary | ICD-10-CM | POA: Diagnosis not present

## 2014-10-15 DIAGNOSIS — M791 Myalgia: Secondary | ICD-10-CM | POA: Diagnosis not present

## 2014-10-15 DIAGNOSIS — J441 Chronic obstructive pulmonary disease with (acute) exacerbation: Secondary | ICD-10-CM | POA: Diagnosis not present

## 2014-10-15 DIAGNOSIS — I2510 Atherosclerotic heart disease of native coronary artery without angina pectoris: Secondary | ICD-10-CM | POA: Diagnosis not present

## 2014-10-18 DIAGNOSIS — E782 Mixed hyperlipidemia: Secondary | ICD-10-CM | POA: Diagnosis not present

## 2014-10-18 DIAGNOSIS — J449 Chronic obstructive pulmonary disease, unspecified: Secondary | ICD-10-CM | POA: Diagnosis not present

## 2014-10-18 DIAGNOSIS — I2510 Atherosclerotic heart disease of native coronary artery without angina pectoris: Secondary | ICD-10-CM | POA: Diagnosis not present

## 2014-10-18 DIAGNOSIS — N401 Enlarged prostate with lower urinary tract symptoms: Secondary | ICD-10-CM | POA: Diagnosis not present

## 2014-11-15 DIAGNOSIS — J441 Chronic obstructive pulmonary disease with (acute) exacerbation: Secondary | ICD-10-CM | POA: Diagnosis not present

## 2014-11-15 DIAGNOSIS — J432 Centrilobular emphysema: Secondary | ICD-10-CM | POA: Diagnosis not present

## 2014-11-15 DIAGNOSIS — R0602 Shortness of breath: Secondary | ICD-10-CM | POA: Diagnosis not present

## 2014-11-15 DIAGNOSIS — J9611 Chronic respiratory failure with hypoxia: Secondary | ICD-10-CM | POA: Diagnosis not present

## 2014-11-30 DIAGNOSIS — Z23 Encounter for immunization: Secondary | ICD-10-CM | POA: Diagnosis not present

## 2014-12-17 DIAGNOSIS — I10 Essential (primary) hypertension: Secondary | ICD-10-CM | POA: Diagnosis not present

## 2014-12-17 DIAGNOSIS — J449 Chronic obstructive pulmonary disease, unspecified: Secondary | ICD-10-CM | POA: Diagnosis not present

## 2014-12-17 DIAGNOSIS — J439 Emphysema, unspecified: Secondary | ICD-10-CM | POA: Diagnosis not present

## 2014-12-17 DIAGNOSIS — Z79899 Other long term (current) drug therapy: Secondary | ICD-10-CM | POA: Diagnosis not present

## 2014-12-18 DIAGNOSIS — F411 Generalized anxiety disorder: Secondary | ICD-10-CM | POA: Diagnosis not present

## 2014-12-18 DIAGNOSIS — J439 Emphysema, unspecified: Secondary | ICD-10-CM | POA: Diagnosis not present

## 2014-12-18 DIAGNOSIS — I10 Essential (primary) hypertension: Secondary | ICD-10-CM | POA: Diagnosis not present

## 2014-12-18 DIAGNOSIS — R413 Other amnesia: Secondary | ICD-10-CM | POA: Diagnosis not present

## 2015-01-01 DIAGNOSIS — F411 Generalized anxiety disorder: Secondary | ICD-10-CM | POA: Diagnosis not present

## 2015-01-01 DIAGNOSIS — R413 Other amnesia: Secondary | ICD-10-CM | POA: Diagnosis not present

## 2015-01-01 DIAGNOSIS — I10 Essential (primary) hypertension: Secondary | ICD-10-CM | POA: Diagnosis not present

## 2015-01-04 DIAGNOSIS — H35032 Hypertensive retinopathy, left eye: Secondary | ICD-10-CM | POA: Diagnosis not present

## 2015-01-04 DIAGNOSIS — H43813 Vitreous degeneration, bilateral: Secondary | ICD-10-CM | POA: Diagnosis not present

## 2015-01-04 DIAGNOSIS — H34231 Retinal artery branch occlusion, right eye: Secondary | ICD-10-CM | POA: Diagnosis not present

## 2015-01-04 DIAGNOSIS — H353132 Nonexudative age-related macular degeneration, bilateral, intermediate dry stage: Secondary | ICD-10-CM | POA: Diagnosis not present

## 2015-01-10 DIAGNOSIS — J441 Chronic obstructive pulmonary disease with (acute) exacerbation: Secondary | ICD-10-CM | POA: Diagnosis not present

## 2015-02-07 DIAGNOSIS — E782 Mixed hyperlipidemia: Secondary | ICD-10-CM | POA: Diagnosis not present

## 2015-02-07 DIAGNOSIS — I2510 Atherosclerotic heart disease of native coronary artery without angina pectoris: Secondary | ICD-10-CM | POA: Diagnosis not present

## 2015-02-07 DIAGNOSIS — I10 Essential (primary) hypertension: Secondary | ICD-10-CM | POA: Diagnosis not present

## 2015-02-14 DIAGNOSIS — I2510 Atherosclerotic heart disease of native coronary artery without angina pectoris: Secondary | ICD-10-CM | POA: Diagnosis not present

## 2015-02-14 DIAGNOSIS — J9611 Chronic respiratory failure with hypoxia: Secondary | ICD-10-CM | POA: Diagnosis not present

## 2015-02-14 DIAGNOSIS — E782 Mixed hyperlipidemia: Secondary | ICD-10-CM | POA: Diagnosis not present

## 2015-02-14 DIAGNOSIS — I10 Essential (primary) hypertension: Secondary | ICD-10-CM | POA: Diagnosis not present

## 2015-04-24 DIAGNOSIS — R351 Nocturia: Secondary | ICD-10-CM | POA: Diagnosis not present

## 2015-04-24 DIAGNOSIS — C61 Malignant neoplasm of prostate: Secondary | ICD-10-CM | POA: Diagnosis not present

## 2015-05-27 DIAGNOSIS — J111 Influenza due to unidentified influenza virus with other respiratory manifestations: Secondary | ICD-10-CM | POA: Diagnosis not present

## 2015-06-13 DIAGNOSIS — M19012 Primary osteoarthritis, left shoulder: Secondary | ICD-10-CM | POA: Diagnosis not present

## 2015-06-13 DIAGNOSIS — M160 Bilateral primary osteoarthritis of hip: Secondary | ICD-10-CM | POA: Diagnosis not present

## 2015-06-13 DIAGNOSIS — M19011 Primary osteoarthritis, right shoulder: Secondary | ICD-10-CM | POA: Diagnosis not present

## 2015-06-13 DIAGNOSIS — M25511 Pain in right shoulder: Secondary | ICD-10-CM | POA: Diagnosis not present

## 2015-06-13 DIAGNOSIS — M25551 Pain in right hip: Secondary | ICD-10-CM | POA: Diagnosis not present

## 2015-06-13 DIAGNOSIS — M25559 Pain in unspecified hip: Secondary | ICD-10-CM | POA: Diagnosis not present

## 2015-06-30 ENCOUNTER — Encounter (HOSPITAL_COMMUNITY): Payer: Self-pay | Admitting: *Deleted

## 2015-06-30 ENCOUNTER — Emergency Department (HOSPITAL_COMMUNITY): Payer: Medicare Other

## 2015-06-30 ENCOUNTER — Emergency Department (HOSPITAL_COMMUNITY)
Admission: EM | Admit: 2015-06-30 | Discharge: 2015-06-30 | Disposition: A | Discharge status: Home / Self Care | Payer: Medicare Other | Attending: Emergency Medicine | Admitting: Emergency Medicine

## 2015-06-30 DIAGNOSIS — M545 Low back pain: Secondary | ICD-10-CM | POA: Diagnosis not present

## 2015-06-30 DIAGNOSIS — Z79899 Other long term (current) drug therapy: Secondary | ICD-10-CM | POA: Insufficient documentation

## 2015-06-30 DIAGNOSIS — R102 Pelvic and perineal pain: Secondary | ICD-10-CM | POA: Diagnosis not present

## 2015-06-30 DIAGNOSIS — M2550 Pain in unspecified joint: Secondary | ICD-10-CM

## 2015-06-30 DIAGNOSIS — I10 Essential (primary) hypertension: Secondary | ICD-10-CM | POA: Diagnosis not present

## 2015-06-30 DIAGNOSIS — M79602 Pain in left arm: Secondary | ICD-10-CM | POA: Diagnosis not present

## 2015-06-30 DIAGNOSIS — D72829 Elevated white blood cell count, unspecified: Secondary | ICD-10-CM | POA: Insufficient documentation

## 2015-06-30 DIAGNOSIS — M25551 Pain in right hip: Secondary | ICD-10-CM | POA: Diagnosis not present

## 2015-06-30 DIAGNOSIS — Z7951 Long term (current) use of inhaled steroids: Secondary | ICD-10-CM | POA: Insufficient documentation

## 2015-06-30 DIAGNOSIS — M79601 Pain in right arm: Secondary | ICD-10-CM | POA: Diagnosis not present

## 2015-06-30 DIAGNOSIS — Z87891 Personal history of nicotine dependence: Secondary | ICD-10-CM | POA: Diagnosis not present

## 2015-06-30 DIAGNOSIS — Z8701 Personal history of pneumonia (recurrent): Secondary | ICD-10-CM | POA: Insufficient documentation

## 2015-06-30 DIAGNOSIS — J449 Chronic obstructive pulmonary disease, unspecified: Secondary | ICD-10-CM | POA: Diagnosis not present

## 2015-06-30 DIAGNOSIS — M25552 Pain in left hip: Secondary | ICD-10-CM | POA: Insufficient documentation

## 2015-06-30 DIAGNOSIS — Z859 Personal history of malignant neoplasm, unspecified: Secondary | ICD-10-CM | POA: Diagnosis not present

## 2015-06-30 DIAGNOSIS — Z7982 Long term (current) use of aspirin: Secondary | ICD-10-CM | POA: Diagnosis not present

## 2015-06-30 DIAGNOSIS — M25559 Pain in unspecified hip: Secondary | ICD-10-CM | POA: Diagnosis not present

## 2015-06-30 DIAGNOSIS — M25511 Pain in right shoulder: Secondary | ICD-10-CM | POA: Insufficient documentation

## 2015-06-30 DIAGNOSIS — K432 Incisional hernia without obstruction or gangrene: Secondary | ICD-10-CM | POA: Diagnosis not present

## 2015-06-30 DIAGNOSIS — M25512 Pain in left shoulder: Secondary | ICD-10-CM | POA: Diagnosis not present

## 2015-06-30 LAB — URINALYSIS, ROUTINE W REFLEX MICROSCOPIC
Bilirubin Urine: NEGATIVE
GLUCOSE, UA: NEGATIVE mg/dL
Hgb urine dipstick: NEGATIVE
Ketones, ur: NEGATIVE mg/dL
LEUKOCYTES UA: NEGATIVE
Nitrite: NEGATIVE
PROTEIN: NEGATIVE mg/dL
SPECIFIC GRAVITY, URINE: 1.02 (ref 1.005–1.030)
pH: 6 (ref 5.0–8.0)

## 2015-06-30 LAB — CBC WITH DIFFERENTIAL/PLATELET
BASOS ABS: 0 10*3/uL (ref 0.0–0.1)
Basophils Relative: 0 %
Eosinophils Absolute: 0.1 10*3/uL (ref 0.0–0.7)
Eosinophils Relative: 1 %
HEMATOCRIT: 37.8 % — AB (ref 39.0–52.0)
Hemoglobin: 12.4 g/dL — ABNORMAL LOW (ref 13.0–17.0)
LYMPHS PCT: 7 %
Lymphs Abs: 0.9 10*3/uL (ref 0.7–4.0)
MCH: 30.4 pg (ref 26.0–34.0)
MCHC: 32.8 g/dL (ref 30.0–36.0)
MCV: 92.6 fL (ref 78.0–100.0)
MONO ABS: 1.9 10*3/uL — AB (ref 0.1–1.0)
Monocytes Relative: 14 %
NEUTROS ABS: 10.3 10*3/uL — AB (ref 1.7–7.7)
Neutrophils Relative %: 78 %
Platelets: 333 10*3/uL (ref 150–400)
RBC: 4.08 MIL/uL — AB (ref 4.22–5.81)
RDW: 14.1 % (ref 11.5–15.5)
WBC: 13.3 10*3/uL — AB (ref 4.0–10.5)

## 2015-06-30 LAB — COMPREHENSIVE METABOLIC PANEL
ALBUMIN: 2.9 g/dL — AB (ref 3.5–5.0)
ALT: 18 U/L (ref 17–63)
AST: 21 U/L (ref 15–41)
Alkaline Phosphatase: 51 U/L (ref 38–126)
Anion gap: 11 (ref 5–15)
BILIRUBIN TOTAL: 0.8 mg/dL (ref 0.3–1.2)
BUN: 44 mg/dL — ABNORMAL HIGH (ref 6–20)
CHLORIDE: 105 mmol/L (ref 101–111)
CO2: 28 mmol/L (ref 22–32)
Calcium: 9.9 mg/dL (ref 8.9–10.3)
Creatinine, Ser: 1.48 mg/dL — ABNORMAL HIGH (ref 0.61–1.24)
GFR calc Af Amer: 47 mL/min — ABNORMAL LOW (ref >60)
GFR calc non Af Amer: 40 mL/min — ABNORMAL LOW (ref >60)
GLUCOSE: 106 mg/dL — AB (ref 65–99)
POTASSIUM: 4.2 mmol/L (ref 3.5–5.1)
SODIUM: 144 mmol/L (ref 135–145)
TOTAL PROTEIN: 6.7 g/dL (ref 6.5–8.1)

## 2015-06-30 MED ORDER — OXYCODONE-ACETAMINOPHEN 5-325 MG PO TABS: 1.0000 | ORAL_TABLET | ORAL | Status: DC | PRN

## 2015-06-30 MED ORDER — MORPHINE SULFATE (PF) 4 MG/ML IV SOLN
4.0000 mg | Freq: Once | INTRAVENOUS | Status: AC
  Administered 2015-06-30: 4 mg via INTRAVENOUS
  Filled 2015-06-30: qty 1

## 2015-06-30 MED ORDER — SODIUM CHLORIDE 0.9 % IV BOLUS (SEPSIS)
500.0000 mL | Freq: Once | INTRAVENOUS | Status: AC
  Administered 2015-06-30: 500 mL via INTRAVENOUS

## 2015-06-30 MED ORDER — FENTANYL CITRATE (PF) 100 MCG/2ML IJ SOLN
25.0000 ug | Freq: Once | INTRAMUSCULAR | Status: AC
  Administered 2015-06-30: 25 ug via INTRAVENOUS
  Filled 2015-06-30: qty 2

## 2015-06-30 NOTE — ED Notes (Signed)
Pt states he has body aches x 6 weeks.  Pt has gone to primary MD and diagnosed with Flu and then arthritis.  Pt states he continues to have extreme body aches.  Pt denies chest pain, SOB or abdominal pain.  Pt c/o10/10 constant, crampy pain bilateral shoulders, arms, hips and buttocks hurts when he moves.

## 2015-06-30 NOTE — ED Notes (Signed)
Patient transported to X-ray 

## 2015-06-30 NOTE — Discharge Instructions (Signed)
Read the information below.  Use the prescribed medication as directed.  Please discuss all new medications with your pharmacist.  Do not take additional tylenol while taking the prescribed pain medication to avoid overdose.  You may return to the Emergency Department at any time for worsening condition or any new symptoms that concern you.    If you develop fevers, uncontrolled pain,  weakness or numbness of the extremity, severe discoloration of the skin, uncontrolled vomiting, or difficulty breathing or swallowing return to the ER for a recheck.   Please call your primary care provider tomorrow to schedule a close follow up appointment.    Joint Pain Joint pain, which is also called arthralgia, can be caused by many things. Joint pain often goes away when you follow your health care provider's instructions for relieving pain at home. However, joint pain can also be caused by conditions that require further treatment. Common causes of joint pain include:  Bruising in the area of the joint.  Overuse of the joint.  Wear and tear on the joints that occur with aging (osteoarthritis).  Various other forms of arthritis.  A buildup of a crystal form of uric acid in the joint (gout).  Infections of the joint (septic arthritis) or of the bone (osteomyelitis). Your health care provider may recommend medicine to help with the pain. If your joint pain continues, additional tests may be needed to diagnose your condition. HOME CARE INSTRUCTIONS Watch your condition for any changes. Follow these instructions as directed to lessen the pain that you are feeling.  Take medicines only as directed by your health care provider.  Rest the affected area for as long as your health care provider says that you should. If directed to do so, raise the painful joint above the level of your heart while you are sitting or lying down.  Do not do things that cause or worsen pain.  If directed, apply ice to the painful  area:  Put ice in a plastic bag.  Place a towel between your skin and the bag.  Leave the ice on for 20 minutes, 2-3 times per day.  Wear an elastic bandage, splint, or sling as directed by your health care provider. Loosen the elastic bandage or splint if your fingers or toes become numb and tingle, or if they turn cold and blue.  Begin exercising or stretching the affected area as directed by your health care provider. Ask your health care provider what types of exercise are safe for you.  Keep all follow-up visits as directed by your health care provider. This is important. SEEK MEDICAL CARE IF:  Your pain increases, and medicine does not help.  Your joint pain does not improve within 3 days.  You have increased bruising or swelling.  You have a fever.  You lose 10 lb (4.5 kg) or more without trying. SEEK IMMEDIATE MEDICAL CARE IF:  You are not able to move the joint.  Your fingers or toes become numb or they turn cold and blue.   This information is not intended to replace advice given to you by your health care provider. Make sure you discuss any questions you have with your health care provider.   Document Released: 02/23/2005 Document Revised: 03/16/2014 Document Reviewed: 12/05/2013 Elsevier Interactive Patient Education 2016 Elsevier Inc.  Leukocytosis Leukocytosis means you have more white blood cells than normal. White blood cells are made in your bone marrow. The main job of white blood cells is to fight infection.  Having too many white blood cells is a common condition. It can develop as a result of many types of medical problems. CAUSES  In some cases, your bone marrow may be normal, but it is still making too many white blood cells. This could be the result of:  Infection.  Injury.  Physical stress.  Emotional stress.  Surgery.  Allergic reactions.  Tumors that do not start in the blood or bone marrow.  An inherited disease.  Certain  medicines.  Pregnancy and labor. In other cases, you may have a bone marrow disorder that is causing your body to make too many white blood cells. Bone marrow disorders include:  Leukemia. This is a type of blood cancer.  Myeloproliferative disorders. These disorders cause blood cells to grow abnormally. SYMPTOMS  Some people have no symptoms. Others have symptoms due to the medical problem that is causing their leukocytosis. These symptoms may include:  Bleeding.  Bruising.  Fever.  Night sweats.  Repeated infections.  Weakness.  Weight loss. DIAGNOSIS  Leukocytosis is often found during blood tests that are done as part of a normal physical exam. Your caregiver will probably order other tests to help determine why you have too many white blood cells. These tests may include:  A complete blood count (CBC). This test measures all the types of blood cells in your body.  Chest X-rays, urine tests (urinalysis), or other tests to look for signs of infection.  Bone marrow aspiration. For this test, a needle is put into your bone. Cells from the bone marrow are removed through the needle. The cells are then examined under a microscope. TREATMENT  Treatment is usually not needed for leukocytosis. However, if a disorder is causing your leukocytosis, it will need to be treated. Treatment may include:  Antibiotic medicines if you have a bacterial infection.  Bone marrow transplant. Your diseased bone marrow is replaced with healthy cells that will grow new bone marrow.  Chemotherapy. This is the use of drugs to kill cancer cells. HOME CARE INSTRUCTIONS  Only take over-the-counter or prescription medicines as directed by your caregiver.  Maintain a healthy weight. Ask your caregiver what weight is best for you.  Eat foods that are low in saturated fats and high in fiber. Eat plenty of fruits and vegetables.  Drink enough fluids to keep your urine clear or pale yellow.  Get 30  minutes of exercise at least 5 times a week. Check with your caregiver before starting a new exercise routine.  Limit caffeine and alcohol.  Do not smoke.  Keep all follow-up appointments as directed by your caregiver. SEEK MEDICAL CARE IF:  You feel weak or more tired than usual.  You develop chills, a cough, or nasal congestion.  You lose weight without trying.  You have night sweats.  You bruise easily. SEEK IMMEDIATE MEDICAL CARE IF:  You bleed more than normal.  You have chest pain.  You have trouble breathing.  You have a fever.  You have uncontrolled nausea or vomiting.  You feel dizzy or lightheaded. MAKE SURE YOU:  Understand these instructions.  Will watch your condition.  Will get help right away if you are not doing well or get worse.   This information is not intended to replace advice given to you by your health care provider. Make sure you discuss any questions you have with your health care provider.   Document Released: 02/12/2011 Document Revised: 05/18/2011 Document Reviewed: 08/27/2014 Elsevier Interactive Patient Education 2016 Elsevier  Inc.  Pain Without a Known Cause WHAT IS PAIN WITHOUT A KNOWN CAUSE? Pain can occur in any part of the body and can range from mild to severe. Sometimes no cause can be found for why you are having pain. Some types of pain that can occur without a known cause include:   Headache.  Back pain.  Abdominal pain.  Neck pain. HOW IS PAIN WITHOUT A KNOWN CAUSE DIAGNOSED?  Your health care provider will try to find the cause of your pain. This may include:  Physical exam.  Medical history.  Blood tests.  Urine tests.  X-rays. If no cause is found, your health care provider may diagnose you with pain without a known cause.  IS THERE TREATMENT FOR PAIN WITHOUT A CAUSE?  Treatment depends on the kind of pain you have. Your health care provider may prescribe medicines to help relieve your pain.  WHAT CAN  I DO AT HOME FOR MY PAIN?   Take medicines only as directed by your health care provider.  Stop any activities that cause pain. During periods of severe pain, bed rest may help.  Try to reduce your stress with activities such as yoga or meditation. Talk to your health care provider for other stress-reducing activity recommendations.  Exercise regularly, if approved by your health care provider.  Eat a healthy diet that includes fruits and vegetables. This may improve pain. Talk to your health care provider if you have any questions about your diet. WHAT IF MY PAIN DOES NOT GET BETTER?  If you have a painful condition and no reason can be found for the pain or the pain gets worse, it is important to follow up with your health care provider. It may be necessary to repeat tests and look further for a possible cause.    This information is not intended to replace advice given to you by your health care provider. Make sure you discuss any questions you have with your health care provider.   Document Released: 11/18/2000 Document Revised: 03/16/2014 Document Reviewed: 07/11/2013 Elsevier Interactive Patient Education [employer].

## 2015-06-30 NOTE — ED Provider Notes (Signed)
CSN: [id number]     Arrival date & time 06/30/15  1014 History   First MD Initiated Contact with Patient 06/30/15 1023     Chief Complaint  Patient presents with  . Generalized Body Aches     (Consider location/radiation/quality/duration/timing/severity/associated sxs/prior Treatment) The history is provided by the patient.     Pt with hx COPD, HTN p/w 6 weeks of body aches.  States he hurts mostly in his hips, tailbone, bilateral shoulders and arms.  It is located in the joints and the muscles.  The pain has been constant and gradually worsening over 6 weeks.  Exacerbated with movement.  Nothing improves the pain.  Has been to PCP at [facility] twice and was first given Tamiflu and second given ibuprofen 600mg  TID and "a narcotic" qHS - states it has not touched the pain.  Went to PCP at [facility] who did not work him up further.  He initially had fevers to 101, since has not taken his temperature but has felt warm.  Denies travel outside of [place].  Family doubts possibility of tick exposure.    Notes his mother had problems with joint pain in her 15s, unsure of diagnosis.   Past Medical History  Diagnosis Date  . Asthma   . Shortness of breath   . Cancer   . Recurrent upper respiratory infection (URI)   . Pneumonia   . H/O hiatal hernia   . COPD (chronic obstructive pulmonary disease)   . Hypertension    Past Surgical History  Procedure Laterality Date  . Colon surgery     No family history on file. Social History  Substance Use Topics  . Smoking status: Former Smoker -- 0.50 packs/day for 25 years    Types: Cigarettes, Pipe, Cigars  . Smokeless tobacco: Not on file  . Alcohol Use: No    Review of Systems  All other systems reviewed and are negative.     Allergies  Sulfa antibiotics  Home Medications   Prior to Admission medications   Medication Sig Start Date End Date Taking? Authorizing Provider  albuterol (PROVENTIL) (2.5 MG/3ML) 0.083% nebulizer  solution Take 3 mLs (2.5 mg total) by nebulization every 6 (six) hours as needed for wheezing or shortness of breath. 08/28/14   Tanda Rockers, MD  arformoterol (BROVANA) 15 MCG/2ML NEBU Take 15 mcg by nebulization 2 (two) times daily.    Historical Provider, MD  aspirin 325 MG tablet Take 325 mg by mouth daily.    Historical Provider, MD  atorvastatin (LIPITOR) 80 MG tablet Take 40 mg by mouth every other day.    Historical Provider, MD  budesonide (PULMICORT) 0.5 MG/2ML nebulizer solution Take 0.5 mg by nebulization 2 (two) times daily.    Historical Provider, MD  calcium-vitamin D (OSCAL WITH D) 500-200 MG-UNIT per tablet Take 1 tablet by mouth 2 (two) times daily.     Historical Provider, MD  dextromethorphan-guaiFENesin (MUCINEX DM) 30-600 MG per 12 hr tablet Take 1 tablet by mouth 2 (two) times daily as needed (cough, congestion).    Historical Provider, MD  famotidine (PEPCID) 20 MG tablet Take 20 mg by mouth 2 (two) times daily.    Historical Provider, MD  hydrochlorothiazide (HYDRODIURIL) 25 MG tablet Take 25 mg by mouth daily.    Historical Provider, MD  irbesartan (AVAPRO) 150 MG tablet Take 1 tablet (150 mg total) by mouth daily. 04/18/14   Tanda Rockers, MD  Multiple Vitamin (MULTIVITAMIN) capsule Take 1 capsule by  mouth daily.    Historical Provider, MD  Multiple Vitamins-Minerals (ICAPS MV) TABS Take 1 tablet by mouth 2 (two) times daily.    Historical Provider, MD  OXYGEN-HELIUM IN Pt uses o2 2lpm with exertion as needed- [employer]    Historical Provider, MD  oxymetazoline (AFRIN) 0.05 % nasal spray Place 1 spray into both nostrils 2 (two) times daily as needed for congestion (for 3 days only).     Historical Provider, MD  Probiotic CAPS Take 1 capsule by mouth daily.    Historical Provider, MD  psyllium (METAMUCIL) 58.6 % packet 1 tbsp by mouth once daily    Historical Provider, MD  Triamcinolone Acetonide (NASACORT ALLERGY 24HR NA) Place 2 sprays into the nose daily.    Historical  Provider, MD   BP 143/81 mmHg  Pulse 107  Temp(Src) 98.1 F (36.7 C) (Oral)  Resp 22  Ht 5\' 7"  (1.702 m)  Wt 73.483 kg  BMI 25.37 kg/m2  SpO2 93% Physical Exam  Constitutional: He appears well-developed and well-nourished. No distress.  HENT:  Head: Normocephalic and atraumatic.  Eyes: Conjunctivae are normal.  Neck: Normal range of motion. Neck supple.  Cardiovascular: Normal rate and regular rhythm.   Pulmonary/Chest: Effort normal and breath sounds normal. No respiratory distress. He has no wheezes. He has no rales.  Abdominal: Soft. Bowel sounds are normal. He exhibits no distension. There is no tenderness. There is no rebound and no guarding.    Genitourinary: Right testis shows no mass, no swelling and no tenderness. Left testis shows no mass, no swelling and no tenderness. Uncircumcised.  Musculoskeletal: Normal range of motion.  No joint tenderness, no reproducible pain.  No erythema, edema, warmth associated with any joints.   Left hand is slightly larger than right.    Neurological: He is alert. He exhibits normal muscle tone.  Skin: He is not diaphoretic.  Psychiatric: He has a normal mood and affect. His behavior is normal.  Nursing note and vitals reviewed.   ED Course  Procedures (including critical care time) Labs Review Labs Reviewed  COMPREHENSIVE METABOLIC PANEL - Abnormal; Notable for the following:    Glucose, Bld 106 (*)    BUN 44 (*)    Creatinine, Ser 1.48 (*)    Albumin 2.9 (*)    GFR calc non Af Amer 40 (*)    GFR calc Af Amer 47 (*)    All other components within normal limits  CBC WITH DIFFERENTIAL/PLATELET - Abnormal; Notable for the following:    WBC 13.3 (*)    RBC 4.08 (*)    Hemoglobin 12.4 (*)    HCT 37.8 (*)    Neutro Abs 10.3 (*)    Monocytes Absolute 1.9 (*)    All other components within normal limits  URINE CULTURE  CULTURE, BLOOD (ROUTINE X 2)  CULTURE, BLOOD (ROUTINE X 2)  URINALYSIS, ROUTINE W REFLEX MICROSCOPIC (NOT AT  [facility])  CBC WITH DIFFERENTIAL/PLATELET    Imaging Review Dg Chest 2 View  06/30/2015  CLINICAL DATA:  Hip pain EXAM: CHEST  2 VIEW COMPARISON:  06/13/2015 FINDINGS: Normal cardiac silhouette. Lungs are hyperinflated. There is emphysematous change in the upper lobes. No focal consolidation or pneumothorax IMPRESSION: Emphysematous change and the lungs.  No Acute findings. Electronically Signed   By: Suzy Bouchard M.D.   On: 06/30/2015 11:43   Dg Lumbar Spine Complete  06/30/2015  CLINICAL DATA:  Pelvic/hip pain EXAM: LUMBAR SPINE - COMPLETE 4+ VIEW COMPARISON:  CT abdomen pelvis  dated 06/02/2010 FINDINGS: Five lumbar-type vertebral bodies. Normal lumbar lordosis. No evidence of fracture or dislocation. Vertebral body heights and intervertebral disc spaces are maintained. Mild multilevel degenerative changes. Visualized bony pelvis appears intact. IMPRESSION: No fracture or dislocation is seen. Mild degenerative changes. Electronically Signed   By: Julian Hy M.D.   On: 06/30/2015 11:42   Dg Pelvis 1-2 Views  06/30/2015  CLINICAL DATA:  Pelvic/bilateral hip pain EXAM: PELVIS - 1-2 VIEW COMPARISON:  None. FINDINGS: No fracture or dislocation is seen. Visualized bony pelvis appears intact. Bilateral hip joint spaces are symmetric. Degenerative changes the lower lumbar spine. IMPRESSION: Negative. Electronically Signed   By: Julian Hy M.D.   On: 06/30/2015 11:43   I have personally reviewed and evaluated these images and lab results as part of my medical decision-making.   EKG Interpretation None       11:12 AM Discussed pt and workup with Dr Rogene Houston.    MDM   Final diagnoses:  Arthralgia of multiple joints  Leukocytosis    Afebrile nontoxic patient with 6 weeks of myalgias, arthralgias, that began with fevers.  No clear etiology at this point.  There are no joints that appear septic.  No rashes, no recent fevers.  No report of tick bites.  Pt likely needs autoimmune  workup.  Pt also seen and examined by Dr Rogene Houston who agrees with workup and plan.  Pt to follow closely with PCP.  D/C home with pain medication.  Discussed with patient precautions regarding using narcotics and preventing falls.  Discussed result, findings, treatment, and follow up  with patient.  Pt given return precautions.  Pt verbalizes understanding and agrees with plan.        Clayton Bibles, PA-C 06/30/15 [place], MD 07/01/15 [phone]

## 2015-07-01 LAB — URINE CULTURE

## 2015-07-02 DIAGNOSIS — M6282 Rhabdomyolysis: Secondary | ICD-10-CM | POA: Diagnosis not present

## 2015-07-02 DIAGNOSIS — M2550 Pain in unspecified joint: Secondary | ICD-10-CM | POA: Diagnosis not present

## 2015-07-02 DIAGNOSIS — Z09 Encounter for follow-up examination after completed treatment for conditions other than malignant neoplasm: Secondary | ICD-10-CM | POA: Diagnosis not present

## 2015-07-05 LAB — CULTURE, BLOOD (ROUTINE X 2)
CULTURE: NO GROWTH
CULTURE: NO GROWTH

## 2015-07-09 DIAGNOSIS — R319 Hematuria, unspecified: Secondary | ICD-10-CM | POA: Diagnosis not present

## 2015-07-09 DIAGNOSIS — N390 Urinary tract infection, site not specified: Secondary | ICD-10-CM | POA: Diagnosis not present

## 2015-07-09 DIAGNOSIS — M353 Polymyalgia rheumatica: Secondary | ICD-10-CM | POA: Diagnosis not present

## 2015-07-16 DIAGNOSIS — I2510 Atherosclerotic heart disease of native coronary artery without angina pectoris: Secondary | ICD-10-CM | POA: Diagnosis not present

## 2015-07-16 DIAGNOSIS — I10 Essential (primary) hypertension: Secondary | ICD-10-CM | POA: Diagnosis not present

## 2015-07-16 DIAGNOSIS — M810 Age-related osteoporosis without current pathological fracture: Secondary | ICD-10-CM | POA: Diagnosis not present

## 2015-07-16 DIAGNOSIS — E782 Mixed hyperlipidemia: Secondary | ICD-10-CM | POA: Diagnosis not present

## 2015-07-16 DIAGNOSIS — Z0000 Encounter for general adult medical examination without abnormal findings: Secondary | ICD-10-CM | POA: Diagnosis not present

## 2015-07-16 DIAGNOSIS — J9611 Chronic respiratory failure with hypoxia: Secondary | ICD-10-CM | POA: Diagnosis not present

## 2015-07-23 DIAGNOSIS — J449 Chronic obstructive pulmonary disease, unspecified: Secondary | ICD-10-CM | POA: Diagnosis not present

## 2015-07-23 DIAGNOSIS — M353 Polymyalgia rheumatica: Secondary | ICD-10-CM | POA: Diagnosis not present

## 2015-07-23 DIAGNOSIS — E782 Mixed hyperlipidemia: Secondary | ICD-10-CM | POA: Diagnosis not present

## 2015-07-23 DIAGNOSIS — M810 Age-related osteoporosis without current pathological fracture: Secondary | ICD-10-CM | POA: Diagnosis not present

## 2015-07-23 DIAGNOSIS — I10 Essential (primary) hypertension: Secondary | ICD-10-CM | POA: Diagnosis not present

## 2015-07-23 DIAGNOSIS — M2550 Pain in unspecified joint: Secondary | ICD-10-CM | POA: Diagnosis not present

## 2015-07-23 DIAGNOSIS — I2510 Atherosclerotic heart disease of native coronary artery without angina pectoris: Secondary | ICD-10-CM | POA: Diagnosis not present

## 2015-08-13 DIAGNOSIS — M2550 Pain in unspecified joint: Secondary | ICD-10-CM | POA: Diagnosis not present

## 2015-08-13 DIAGNOSIS — H35449 Age-related reticular degeneration of retina, unspecified eye: Secondary | ICD-10-CM | POA: Diagnosis not present

## 2015-08-13 DIAGNOSIS — M353 Polymyalgia rheumatica: Secondary | ICD-10-CM | POA: Diagnosis not present

## 2015-08-20 DIAGNOSIS — M353 Polymyalgia rheumatica: Secondary | ICD-10-CM | POA: Diagnosis not present

## 2015-08-20 DIAGNOSIS — I2510 Atherosclerotic heart disease of native coronary artery without angina pectoris: Secondary | ICD-10-CM | POA: Diagnosis not present

## 2015-09-17 DIAGNOSIS — M353 Polymyalgia rheumatica: Secondary | ICD-10-CM | POA: Diagnosis not present

## 2015-09-17 DIAGNOSIS — E782 Mixed hyperlipidemia: Secondary | ICD-10-CM | POA: Diagnosis not present

## 2015-09-24 DIAGNOSIS — I493 Ventricular premature depolarization: Secondary | ICD-10-CM | POA: Diagnosis not present

## 2015-09-24 DIAGNOSIS — J449 Chronic obstructive pulmonary disease, unspecified: Secondary | ICD-10-CM | POA: Diagnosis not present

## 2015-09-24 DIAGNOSIS — M353 Polymyalgia rheumatica: Secondary | ICD-10-CM | POA: Diagnosis not present

## 2015-09-30 DIAGNOSIS — I10 Essential (primary) hypertension: Secondary | ICD-10-CM | POA: Diagnosis not present

## 2015-09-30 DIAGNOSIS — I493 Ventricular premature depolarization: Secondary | ICD-10-CM | POA: Diagnosis not present

## 2015-09-30 DIAGNOSIS — E7800 Pure hypercholesterolemia, unspecified: Secondary | ICD-10-CM | POA: Diagnosis not present

## 2015-10-05 ENCOUNTER — Emergency Department (HOSPITAL_COMMUNITY)
Admission: EM | Admit: 2015-10-05 | Discharge: 2015-10-05 | Disposition: A | Discharge status: Home / Self Care | Payer: Medicare Other | Attending: Emergency Medicine | Admitting: Emergency Medicine

## 2015-10-05 ENCOUNTER — Encounter (HOSPITAL_COMMUNITY): Payer: Self-pay

## 2015-10-05 ENCOUNTER — Emergency Department (HOSPITAL_COMMUNITY): Payer: Medicare Other

## 2015-10-05 DIAGNOSIS — J441 Chronic obstructive pulmonary disease with (acute) exacerbation: Secondary | ICD-10-CM | POA: Insufficient documentation

## 2015-10-05 DIAGNOSIS — Z8546 Personal history of malignant neoplasm of prostate: Secondary | ICD-10-CM | POA: Insufficient documentation

## 2015-10-05 DIAGNOSIS — I10 Essential (primary) hypertension: Secondary | ICD-10-CM | POA: Insufficient documentation

## 2015-10-05 DIAGNOSIS — Z7982 Long term (current) use of aspirin: Secondary | ICD-10-CM | POA: Insufficient documentation

## 2015-10-05 DIAGNOSIS — Z79899 Other long term (current) drug therapy: Secondary | ICD-10-CM | POA: Insufficient documentation

## 2015-10-05 DIAGNOSIS — R0602 Shortness of breath: Secondary | ICD-10-CM | POA: Diagnosis not present

## 2015-10-05 DIAGNOSIS — Z87891 Personal history of nicotine dependence: Secondary | ICD-10-CM | POA: Diagnosis not present

## 2015-10-05 DIAGNOSIS — R05 Cough: Secondary | ICD-10-CM | POA: Diagnosis not present

## 2015-10-05 HISTORY — DX: Unspecified atrial fibrillation: I48.91

## 2015-10-05 LAB — CBC WITH DIFFERENTIAL/PLATELET
Basophils Absolute: 0 10*3/uL (ref 0.0–0.1)
Basophils Relative: 0 %
Eosinophils Absolute: 0.1 10*3/uL (ref 0.0–0.7)
Eosinophils Relative: 1 %
HCT: 41.2 % (ref 39.0–52.0)
HEMOGLOBIN: 13.3 g/dL (ref 13.0–17.0)
LYMPHS ABS: 1.2 10*3/uL (ref 0.7–4.0)
LYMPHS PCT: 9 %
MCH: 31.1 pg (ref 26.0–34.0)
MCHC: 32.3 g/dL (ref 30.0–36.0)
MCV: 96.5 fL (ref 78.0–100.0)
MONOS PCT: 8 %
Monocytes Absolute: 0.9 10*3/uL (ref 0.1–1.0)
NEUTROS PCT: 82 %
Neutro Abs: 10.1 10*3/uL — ABNORMAL HIGH (ref 1.7–7.7)
Platelets: 266 10*3/uL (ref 150–400)
RBC: 4.27 MIL/uL (ref 4.22–5.81)
RDW: 16.5 % — ABNORMAL HIGH (ref 11.5–15.5)
WBC: 12.3 10*3/uL — AB (ref 4.0–10.5)

## 2015-10-05 LAB — BASIC METABOLIC PANEL
Anion gap: 8 (ref 5–15)
BUN: 20 mg/dL (ref 6–20)
CHLORIDE: 107 mmol/L (ref 101–111)
CO2: 26 mmol/L (ref 22–32)
Calcium: 9.6 mg/dL (ref 8.9–10.3)
Creatinine, Ser: 1.39 mg/dL — ABNORMAL HIGH (ref 0.61–1.24)
GFR calc Af Amer: 51 mL/min — ABNORMAL LOW (ref >60)
GFR calc non Af Amer: 44 mL/min — ABNORMAL LOW (ref >60)
Glucose, Bld: 91 mg/dL (ref 65–99)
POTASSIUM: 3.8 mmol/L (ref 3.5–5.1)
SODIUM: 141 mmol/L (ref 135–145)

## 2015-10-05 LAB — I-STAT TROPONIN, ED: Troponin i, poc: 0 ng/mL (ref 0.00–0.08)

## 2015-10-05 MED ORDER — PREDNISONE 20 MG PO TABS: ORAL_TABLET | ORAL | 0 refills | Status: DC

## 2015-10-05 MED ORDER — ALBUTEROL SULFATE (2.5 MG/3ML) 0.083% IN NEBU: 5.0000 mg | INHALATION_SOLUTION | Freq: Once | RESPIRATORY_TRACT | Status: DC

## 2015-10-05 MED ORDER — PREDNISONE 20 MG PO TABS
60.0000 mg | ORAL_TABLET | Freq: Once | ORAL | Status: AC
  Administered 2015-10-05: 60 mg via ORAL
  Filled 2015-10-05: qty 3

## 2015-10-05 MED ORDER — IPRATROPIUM-ALBUTEROL 0.5-2.5 (3) MG/3ML IN SOLN
3.0000 mL | Freq: Once | RESPIRATORY_TRACT | Status: AC
  Administered 2015-10-05: 3 mL via RESPIRATORY_TRACT
  Filled 2015-10-05: qty 3

## 2015-10-05 NOTE — ED Triage Notes (Signed)
Patient here with shortness of breath x 2 days, has COPD and using neb and inhalers with minimal relief. Speaking full sentences on arrival, no acute distress.Marland Kitchen deniEs CP

## 2015-10-05 NOTE — ED Provider Notes (Signed)
[place] DEPT Provider Note   CSN: [id number] Arrival date & time: 10/05/15  1001  First Provider Contact:  First MD Initiated Contact with Patient 10/05/15 1043        History   Chief Complaint Chief Complaint  Patient presents with  . Shortness of Breath    HPI Trevor Pitts is a 82 y.o. male with a history of COPD presents with shortness of breath. Has been short of breath over the last 3 or 4 days, yesterday was the worst. He uses albuterol inhaler every 4 hours, 2 puffs. Today he feels better but is still short of breath, especially with exertion. At rest he feels okay but he had significant dyspnea when walking to get his mail this morning. Has had a cough with some clear sputum. No fevers. Some chest tightness but no chest pain or pressure. No leg swelling. Some wheezing at home per the wife. Patient states this feels like prior COPD flares. Scheduled to see his pulmonologist again in 3 days. Patient recently was diagnosed with atrial fibrillation by a cardiologist after an ECG. He was noticed to have PVCs while in pulmonary rehabilitation and was referred to cardiology. Patient wife are worried about his heart. He wears oxygen 24 hours per day except when at rest.  HPI  Past Medical History:  Diagnosis Date  . Asthma   . Atrial fibrillation ([place])   . Cancer ([place])   . COPD (chronic obstructive pulmonary disease) ([place])   . H/O hiatal hernia   . Hypertension   . Pneumonia   . Recurrent upper respiratory infection (URI)   . Shortness of breath     Patient Active Problem List   Diagnosis Date Noted  . Dysuria 12/14/2013  . Chronic rhinitis 11/02/2013  . Chronic respiratory failure with hypoxia ([place]) 08/21/2012  . Dysphagia 03/25/2011    Class: Acute  . COPD exacerbation ([place]) 03/22/2011  . CAP (community acquired pneumonia) 03/22/2011  . PROSTATE CANCER 03/28/2010  . Upper airway cough syndrome 03/28/2010  . HYPERLIPIDEMIA 02/13/2009  . Essential  hypertension 02/13/2009  . COPD GOLD III 02/13/2009  . HERN UNS SITE ABD CAV W/O MENTION OBST/GANGREN 02/13/2009    Past Surgical History:  Procedure Laterality Date  . COLON SURGERY         Home Medications    Prior to Admission medications   Medication Sig Start Date End Date Taking? Authorizing Provider  albuterol (PROVENTIL) (2.5 MG/3ML) 0.083% nebulizer solution Take 3 mLs (2.5 mg total) by nebulization every 6 (six) hours as needed for wheezing or shortness of breath. 08/28/14   Tanda Rockers, MD  arformoterol (BROVANA) 15 MCG/2ML NEBU Take 15 mcg by nebulization 2 (two) times daily.    Historical Provider, MD  aspirin 325 MG tablet Take 325 mg by mouth daily.    Historical Provider, MD  atorvastatin (LIPITOR) 80 MG tablet Take 40 mg by mouth every other day.    Historical Provider, MD  benazepril (LOTENSIN) 40 MG tablet Take 40 mg by mouth daily.    Historical Provider, MD  budesonide (PULMICORT) 0.5 MG/2ML nebulizer solution Take 0.5 mg by nebulization 2 (two) times daily.    Historical Provider, MD  calcium-vitamin D (OSCAL WITH D) 500-200 MG-UNIT per tablet Take 1 tablet by mouth 2 (two) times daily.     Historical Provider, MD  dextromethorphan-guaiFENesin (MUCINEX DM) 30-600 MG per 12 hr tablet Take 1 tablet by mouth 2 (two) times daily as needed (cough, congestion).  Historical Provider, MD  famotidine (PEPCID) 20 MG tablet Take 20 mg by mouth 2 (two) times daily.    Historical Provider, MD  hydrochlorothiazide (HYDRODIURIL) 25 MG tablet Take 25 mg by mouth daily.    Historical Provider, MD  ibuprofen (ADVIL,MOTRIN) 600 MG tablet Take 600 mg by mouth 3 (three) times daily.    Historical Provider, MD  irbesartan (AVAPRO) 150 MG tablet Take 1 tablet (150 mg total) by mouth daily. 04/18/14   Tanda Rockers, MD  Multiple Vitamin (MULTIVITAMIN) capsule Take 1 capsule by mouth daily.    Historical Provider, MD  Multiple Vitamins-Minerals (ICAPS MV) TABS Take 1 tablet by mouth 2  (two) times daily.    Historical Provider, MD  oxyCODONE-acetaminophen (PERCOCET/ROXICET) 5-325 MG tablet Take 1 tablet by mouth every 4 (four) hours as needed for severe pain. 06/30/15   Clayton Bibles, PA-C  OXYGEN-HELIUM IN Pt uses o2 2lpm with exertion as needed- [employer]    Historical Provider, MD  oxymetazoline (AFRIN) 0.05 % nasal spray Place 1 spray into both nostrils 2 (two) times daily as needed for congestion (for 3 days only).     Historical Provider, MD  predniSONE (DELTASONE) 20 MG tablet 2 tabs po daily x 4 days 10/06/15   Sherwood Gambler, MD  Probiotic CAPS Take 1 capsule by mouth daily.    Historical Provider, MD  psyllium (METAMUCIL) 58.6 % packet 1 tbsp by mouth once daily    Historical Provider, MD  Triamcinolone Acetonide (NASACORT ALLERGY 24HR NA) Place 2 sprays into the nose daily.    Historical Provider, MD    Family History No family history on file.  Social History Social History  Substance Use Topics  . Smoking status: Former Smoker    Packs/day: 0.50    Years: 25.00    Types: Cigarettes, Pipe, Cigars  . Smokeless tobacco: Never Used  . Alcohol use No     Allergies   Sulfa antibiotics   Review of Systems Review of Systems  HENT: Positive for congestion.   Respiratory: Positive for cough, chest tightness and shortness of breath.   Cardiovascular: Negative for chest pain and leg swelling.  Gastrointestinal: Negative for vomiting.  Neurological: Negative for dizziness and light-headedness.  All other systems reviewed and are negative.    Physical Exam Updated Vital Signs BP 124/94   Pulse 90   Temp 97.6 F (36.4 C) (Oral)   Resp 26   Ht 5\' 8"  (1.727 m)   Wt 165 lb (74.8 kg)   SpO2 97%   BMI 25.09 kg/m   Physical Exam  Constitutional: He is oriented to person, place, and time. He appears well-developed and well-nourished. No distress.  HENT:  Head: Normocephalic and atraumatic.  Right Ear: External ear normal.  Left Ear: External ear normal.    Nose: Nose normal.  Eyes: Right eye exhibits no discharge. Left eye exhibits no discharge.  Neck: Neck supple.  Cardiovascular: Normal rate, regular rhythm, normal heart sounds and intact distal pulses.   Pulmonary/Chest: Effort normal. No respiratory distress.  Speaks in complete sentences. No increased WOB while at rest. Diffuse decreased BS, difficult to tell if from pathology or chronic COPD/emphysema  Abdominal: Soft. He exhibits no distension. There is no tenderness.  Musculoskeletal: He exhibits no edema.  Neurological: He is alert and oriented to person, place, and time.  Skin: Skin is warm and dry. He is not diaphoretic.  Nursing note and vitals reviewed.    ED Treatments / Results  Labs (all  labs ordered are listed, but only abnormal results are displayed) Labs Reviewed  BASIC METABOLIC PANEL - Abnormal; Notable for the following:       Result Value   Creatinine, Ser 1.39 (*)    GFR calc non Af Amer 44 (*)    GFR calc Af Amer 51 (*)    All other components within normal limits  CBC WITH DIFFERENTIAL/PLATELET - Abnormal; Notable for the following:    WBC 12.3 (*)    RDW 16.5 (*)    Neutro Abs 10.1 (*)    All other components within normal limits  I-STAT TROPOININ, ED    EKG  EKG Interpretation  Date/Time:  Saturday October 05 2015 10:18:30 EDT Ventricular Rate:  85 PR Interval:  140 QRS Duration: 82 QT Interval:  386 QTC Calculation: 459 R Axis:   72 Text Interpretation:  Sinus rhythm with Premature supraventricular complexes with occasional Premature ventricular complexes Otherwise normal ECG no significant change since April 2017 Confirmed by Regenia Skeeter MD, Margarete Horace [phone]) on 10/05/2015 10:25:01 AM       Radiology Dg Chest 2 View  Result Date: 10/05/2015 CLINICAL DATA:  Cough, shortness of breath, COPD. EXAM: CHEST  2 VIEW COMPARISON:  06/30/2015 FINDINGS: Calcifications in the lungs bilaterally compatible with old granulomas. There is hyperinflation of the  lungs compatible with COPD. Heart is normal size. No confluent opacity or effusion. No acute bony abnormality. IMPRESSION: COPD.  Old granulomatous disease.  No active disease. Electronically Signed   By: Rolm Baptise M.D.   On: 10/05/2015 11:33   Procedures Procedures (including critical care time)  Medications Ordered in ED Medications  ipratropium-albuterol (DUONEB) 0.5-2.5 (3) MG/3ML nebulizer solution 3 mL (3 mLs Nebulization Given 10/05/15 1141)  predniSONE (DELTASONE) tablet 60 mg (60 mg Oral Given 10/05/15 1139)     Initial Impression / Assessment and Plan / ED Course  I have reviewed the triage vital signs and the nursing notes.  Pertinent labs & imaging results that were available during my care of the patient were reviewed by me and considered in my medical decision making (see chart for details).  Clinical Course  Comment By Time  I think patient has a mild COPD exacerbation. Given his chest tightness, will check ECG and labs with CXR. Duoneb and prednisone Sherwood Gambler, MD 07/29 1056  Patient feels better after duoneb. Lung sounds improved. Will d/c home with steroid burst. Doubt ACS, PE. F/u with pulmonology in 3 days as scheduled Sherwood Gambler, MD 07/29 1234     Final Clinical Impressions(s) / ED Diagnoses   Final diagnoses:  COPD exacerbation ([place])    New Prescriptions New Prescriptions   PREDNISONE (DELTASONE) 20 MG TABLET    2 tabs po daily x 4 days     Sherwood Gambler, MD 10/05/15 1249

## 2015-10-07 DIAGNOSIS — M353 Polymyalgia rheumatica: Secondary | ICD-10-CM | POA: Diagnosis not present

## 2015-10-07 DIAGNOSIS — J441 Chronic obstructive pulmonary disease with (acute) exacerbation: Secondary | ICD-10-CM | POA: Diagnosis not present

## 2015-10-14 DIAGNOSIS — I10 Essential (primary) hypertension: Secondary | ICD-10-CM | POA: Diagnosis not present

## 2015-10-14 DIAGNOSIS — I472 Ventricular tachycardia: Secondary | ICD-10-CM | POA: Diagnosis not present

## 2015-10-14 DIAGNOSIS — I493 Ventricular premature depolarization: Secondary | ICD-10-CM | POA: Diagnosis not present

## 2015-10-14 DIAGNOSIS — R0602 Shortness of breath: Secondary | ICD-10-CM | POA: Diagnosis not present

## 2015-10-21 DIAGNOSIS — M353 Polymyalgia rheumatica: Secondary | ICD-10-CM | POA: Diagnosis not present

## 2015-10-22 DIAGNOSIS — I472 Ventricular tachycardia: Secondary | ICD-10-CM | POA: Diagnosis not present

## 2015-10-22 DIAGNOSIS — I10 Essential (primary) hypertension: Secondary | ICD-10-CM | POA: Diagnosis not present

## 2015-10-22 DIAGNOSIS — I493 Ventricular premature depolarization: Secondary | ICD-10-CM | POA: Diagnosis not present

## 2015-10-22 DIAGNOSIS — R0602 Shortness of breath: Secondary | ICD-10-CM | POA: Diagnosis not present

## 2015-10-28 DIAGNOSIS — M353 Polymyalgia rheumatica: Secondary | ICD-10-CM | POA: Diagnosis not present

## 2015-10-28 DIAGNOSIS — Z23 Encounter for immunization: Secondary | ICD-10-CM | POA: Diagnosis not present

## 2015-10-29 DIAGNOSIS — E7800 Pure hypercholesterolemia, unspecified: Secondary | ICD-10-CM | POA: Diagnosis not present

## 2015-10-29 DIAGNOSIS — I493 Ventricular premature depolarization: Secondary | ICD-10-CM | POA: Diagnosis not present

## 2015-10-29 DIAGNOSIS — I10 Essential (primary) hypertension: Secondary | ICD-10-CM | POA: Diagnosis not present

## 2015-11-16 DIAGNOSIS — R0609 Other forms of dyspnea: Secondary | ICD-10-CM | POA: Diagnosis not present

## 2015-11-16 DIAGNOSIS — J441 Chronic obstructive pulmonary disease with (acute) exacerbation: Secondary | ICD-10-CM | POA: Diagnosis not present

## 2015-11-16 DIAGNOSIS — R0602 Shortness of breath: Secondary | ICD-10-CM | POA: Diagnosis not present

## 2015-11-18 DIAGNOSIS — J441 Chronic obstructive pulmonary disease with (acute) exacerbation: Secondary | ICD-10-CM | POA: Diagnosis not present

## 2015-12-03 DIAGNOSIS — E7800 Pure hypercholesterolemia, unspecified: Secondary | ICD-10-CM | POA: Diagnosis not present

## 2015-12-03 DIAGNOSIS — I493 Ventricular premature depolarization: Secondary | ICD-10-CM | POA: Diagnosis not present

## 2015-12-03 DIAGNOSIS — I10 Essential (primary) hypertension: Secondary | ICD-10-CM | POA: Diagnosis not present

## 2015-12-03 DIAGNOSIS — R0602 Shortness of breath: Secondary | ICD-10-CM | POA: Diagnosis not present

## 2015-12-06 DIAGNOSIS — M353 Polymyalgia rheumatica: Secondary | ICD-10-CM | POA: Diagnosis not present

## 2015-12-06 DIAGNOSIS — Z9981 Dependence on supplemental oxygen: Secondary | ICD-10-CM | POA: Diagnosis not present

## 2015-12-06 DIAGNOSIS — R0602 Shortness of breath: Secondary | ICD-10-CM | POA: Diagnosis not present

## 2015-12-06 DIAGNOSIS — J441 Chronic obstructive pulmonary disease with (acute) exacerbation: Secondary | ICD-10-CM | POA: Diagnosis not present

## 2015-12-10 DIAGNOSIS — F419 Anxiety disorder, unspecified: Secondary | ICD-10-CM | POA: Diagnosis not present

## 2015-12-10 DIAGNOSIS — M353 Polymyalgia rheumatica: Secondary | ICD-10-CM | POA: Diagnosis not present

## 2015-12-10 DIAGNOSIS — I2510 Atherosclerotic heart disease of native coronary artery without angina pectoris: Secondary | ICD-10-CM | POA: Diagnosis not present

## 2015-12-10 DIAGNOSIS — E782 Mixed hyperlipidemia: Secondary | ICD-10-CM | POA: Diagnosis not present

## 2015-12-17 DIAGNOSIS — I493 Ventricular premature depolarization: Secondary | ICD-10-CM | POA: Diagnosis not present

## 2015-12-17 DIAGNOSIS — R0602 Shortness of breath: Secondary | ICD-10-CM | POA: Diagnosis not present

## 2015-12-17 DIAGNOSIS — E7800 Pure hypercholesterolemia, unspecified: Secondary | ICD-10-CM | POA: Diagnosis not present

## 2015-12-17 DIAGNOSIS — I10 Essential (primary) hypertension: Secondary | ICD-10-CM | POA: Diagnosis not present

## 2015-12-19 DIAGNOSIS — J449 Chronic obstructive pulmonary disease, unspecified: Secondary | ICD-10-CM | POA: Diagnosis not present

## 2015-12-20 DIAGNOSIS — J449 Chronic obstructive pulmonary disease, unspecified: Secondary | ICD-10-CM | POA: Diagnosis not present

## 2015-12-23 DIAGNOSIS — J449 Chronic obstructive pulmonary disease, unspecified: Secondary | ICD-10-CM | POA: Diagnosis not present

## 2015-12-25 DIAGNOSIS — J449 Chronic obstructive pulmonary disease, unspecified: Secondary | ICD-10-CM | POA: Diagnosis not present

## 2015-12-27 DIAGNOSIS — J449 Chronic obstructive pulmonary disease, unspecified: Secondary | ICD-10-CM | POA: Diagnosis not present

## 2015-12-30 DIAGNOSIS — J449 Chronic obstructive pulmonary disease, unspecified: Secondary | ICD-10-CM | POA: Diagnosis not present

## 2016-01-01 DIAGNOSIS — H43813 Vitreous degeneration, bilateral: Secondary | ICD-10-CM | POA: Diagnosis not present

## 2016-01-01 DIAGNOSIS — H34231 Retinal artery branch occlusion, right eye: Secondary | ICD-10-CM | POA: Diagnosis not present

## 2016-01-01 DIAGNOSIS — H353132 Nonexudative age-related macular degeneration, bilateral, intermediate dry stage: Secondary | ICD-10-CM | POA: Diagnosis not present

## 2016-01-01 DIAGNOSIS — J449 Chronic obstructive pulmonary disease, unspecified: Secondary | ICD-10-CM | POA: Diagnosis not present

## 2016-01-03 DIAGNOSIS — J449 Chronic obstructive pulmonary disease, unspecified: Secondary | ICD-10-CM | POA: Diagnosis not present

## 2016-01-06 DIAGNOSIS — J449 Chronic obstructive pulmonary disease, unspecified: Secondary | ICD-10-CM | POA: Diagnosis not present

## 2016-01-08 DIAGNOSIS — J449 Chronic obstructive pulmonary disease, unspecified: Secondary | ICD-10-CM | POA: Diagnosis not present

## 2016-01-10 DIAGNOSIS — J449 Chronic obstructive pulmonary disease, unspecified: Secondary | ICD-10-CM | POA: Diagnosis not present

## 2016-01-15 DIAGNOSIS — J449 Chronic obstructive pulmonary disease, unspecified: Secondary | ICD-10-CM | POA: Diagnosis not present

## 2016-01-17 DIAGNOSIS — J449 Chronic obstructive pulmonary disease, unspecified: Secondary | ICD-10-CM | POA: Diagnosis not present

## 2016-01-20 DIAGNOSIS — J449 Chronic obstructive pulmonary disease, unspecified: Secondary | ICD-10-CM | POA: Diagnosis not present

## 2016-01-22 DIAGNOSIS — J449 Chronic obstructive pulmonary disease, unspecified: Secondary | ICD-10-CM | POA: Diagnosis not present

## 2016-01-27 DIAGNOSIS — J449 Chronic obstructive pulmonary disease, unspecified: Secondary | ICD-10-CM | POA: Diagnosis not present

## 2016-01-28 DIAGNOSIS — J449 Chronic obstructive pulmonary disease, unspecified: Secondary | ICD-10-CM | POA: Diagnosis not present

## 2016-02-01 ENCOUNTER — Emergency Department (HOSPITAL_COMMUNITY): Payer: Medicare Other

## 2016-02-01 ENCOUNTER — Encounter (HOSPITAL_COMMUNITY): Payer: Self-pay | Admitting: *Deleted

## 2016-02-01 ENCOUNTER — Emergency Department (HOSPITAL_COMMUNITY)
Admission: EM | Admit: 2016-02-01 | Discharge: 2016-02-01 | Disposition: A | Discharge status: Home / Self Care | Payer: Medicare Other | Attending: Emergency Medicine | Admitting: Emergency Medicine

## 2016-02-01 DIAGNOSIS — Z8546 Personal history of malignant neoplasm of prostate: Secondary | ICD-10-CM | POA: Insufficient documentation

## 2016-02-01 DIAGNOSIS — J449 Chronic obstructive pulmonary disease, unspecified: Secondary | ICD-10-CM | POA: Diagnosis not present

## 2016-02-01 DIAGNOSIS — Z7982 Long term (current) use of aspirin: Secondary | ICD-10-CM | POA: Insufficient documentation

## 2016-02-01 DIAGNOSIS — I10 Essential (primary) hypertension: Secondary | ICD-10-CM | POA: Insufficient documentation

## 2016-02-01 DIAGNOSIS — Z87891 Personal history of nicotine dependence: Secondary | ICD-10-CM | POA: Diagnosis not present

## 2016-02-01 DIAGNOSIS — Z79899 Other long term (current) drug therapy: Secondary | ICD-10-CM | POA: Insufficient documentation

## 2016-02-01 DIAGNOSIS — R062 Wheezing: Secondary | ICD-10-CM | POA: Diagnosis not present

## 2016-02-01 DIAGNOSIS — R0602 Shortness of breath: Secondary | ICD-10-CM

## 2016-02-01 LAB — BASIC METABOLIC PANEL
Anion gap: 7 (ref 5–15)
BUN: 28 mg/dL — AB (ref 6–20)
CALCIUM: 9.9 mg/dL (ref 8.9–10.3)
CO2: 32 mmol/L (ref 22–32)
CREATININE: 1.35 mg/dL — AB (ref 0.61–1.24)
Chloride: 102 mmol/L (ref 101–111)
GFR, EST AFRICAN AMERICAN: 52 mL/min — AB (ref >60)
GFR, EST NON AFRICAN AMERICAN: 45 mL/min — AB (ref >60)
Glucose, Bld: 92 mg/dL (ref 65–99)
Potassium: 3.9 mmol/L (ref 3.5–5.1)
SODIUM: 141 mmol/L (ref 135–145)

## 2016-02-01 LAB — I-STAT TROPONIN, ED: TROPONIN I, POC: 0 ng/mL (ref 0.00–0.08)

## 2016-02-01 LAB — CBC
HCT: 44 % (ref 39.0–52.0)
Hemoglobin: 14.8 g/dL (ref 13.0–17.0)
MCH: 31.4 pg (ref 26.0–34.0)
MCHC: 33.6 g/dL (ref 30.0–36.0)
MCV: 93.2 fL (ref 78.0–100.0)
PLATELETS: 258 10*3/uL (ref 150–400)
RBC: 4.72 MIL/uL (ref 4.22–5.81)
RDW: 14.8 % (ref 11.5–15.5)
WBC: 13.8 10*3/uL — AB (ref 4.0–10.5)

## 2016-02-01 MED ORDER — ALBUTEROL SULFATE (2.5 MG/3ML) 0.083% IN NEBU
5.0000 mg | INHALATION_SOLUTION | Freq: Once | RESPIRATORY_TRACT | Status: AC
  Administered 2016-02-01: 5 mg via RESPIRATORY_TRACT
  Filled 2016-02-01: qty 6

## 2016-02-01 MED ORDER — IPRATROPIUM BROMIDE 0.02 % IN SOLN
0.5000 mg | Freq: Once | RESPIRATORY_TRACT | Status: AC
  Administered 2016-02-01: 0.5 mg via RESPIRATORY_TRACT
  Filled 2016-02-01: qty 2.5

## 2016-02-01 MED ORDER — PREDNISONE 20 MG PO TABS: ORAL_TABLET | ORAL | 0 refills | Status: DC

## 2016-02-01 MED ORDER — METHYLPREDNISOLONE SODIUM SUCC 125 MG IJ SOLR
125.0000 mg | Freq: Once | INTRAMUSCULAR | Status: AC
  Administered 2016-02-01: 125 mg via INTRAVENOUS
  Filled 2016-02-01: qty 2

## 2016-02-01 NOTE — Discharge Instructions (Signed)
Take the prescribed medication as directed.  Recommend to do home albuterol treatments every 4-6 hours for the next 1-2 days to help with your breathing. Follow-up with your primary care doctor. Return to the ED for new or worsening symptoms.

## 2016-02-01 NOTE — ED Triage Notes (Signed)
Pt reports ongoing sob, has been to pcp and put on prednisone for his copd. Denies recent cough or fever. Sob with exertion. ekg done at triage. Able to speak in full sentences.

## 2016-02-01 NOTE — ED Provider Notes (Signed)
[facility] DEPT Provider Note   CSN: [id number] Arrival date & time: 02/01/16  1133     History   Chief Complaint Chief Complaint  Patient presents with  . Shortness of Breath    HPI Trevor Pitts is a 82 y.o. male.  The history is provided by the patient and medical records.  Shortness of Breath  Associated symptoms include cough and wheezing.    82 year old male with history of asthma, A. fib, hypertension, recurrent URI, COPD, presenting to the ED for shortness of breath.  Patient states he has a history of ongoing shortness breath, however has been worse recently. States his symptoms are worse with exertion. States he was seen by his PCP and put on prednisone as well as antibiotics without any change in his symptoms. States he is on the last day of his prednisone taper. States he does have a productive cough with clear sputum. He denies any fever or chills. No chest pain, diaphoresis, nausea, or vomiting. Patient has home oxygen that he uses as needed and at nighttime. States usually his 2 L. States lately he has been using this more than normal, staying on ir nearly continuously for the past few days.  Past Medical History:  Diagnosis Date  . Asthma   . Atrial fibrillation ([place])   . Cancer ([place])   . COPD (chronic obstructive pulmonary disease) ([place])   . H/O hiatal hernia   . Hypertension   . Pneumonia   . Recurrent upper respiratory infection (URI)   . Shortness of breath     Patient Active Problem List   Diagnosis Date Noted  . Dysuria 12/14/2013  . Chronic rhinitis 11/02/2013  . Chronic respiratory failure with hypoxia ([place]) 08/21/2012  . Dysphagia 03/25/2011    Class: Acute  . COPD exacerbation ([place]) 03/22/2011  . CAP (community acquired pneumonia) 03/22/2011  . PROSTATE CANCER 03/28/2010  . Upper airway cough syndrome 03/28/2010  . HYPERLIPIDEMIA 02/13/2009  . Essential hypertension 02/13/2009  . COPD GOLD III 02/13/2009  . HERN UNS SITE ABD CAV W/O  MENTION OBST/GANGREN 02/13/2009    Past Surgical History:  Procedure Laterality Date  . COLON SURGERY         Home Medications    Prior to Admission medications   Medication Sig Start Date End Date Taking? Authorizing Provider  albuterol (PROVENTIL) (2.5 MG/3ML) 0.083% nebulizer solution Take 3 mLs (2.5 mg total) by nebulization every 6 (six) hours as needed for wheezing or shortness of breath. 08/28/14   Tanda Rockers, MD  arformoterol (BROVANA) 15 MCG/2ML NEBU Take 15 mcg by nebulization 2 (two) times daily.    Historical Provider, MD  aspirin 325 MG tablet Take 325 mg by mouth daily.    Historical Provider, MD  atorvastatin (LIPITOR) 80 MG tablet Take 40 mg by mouth every other day.    Historical Provider, MD  benazepril (LOTENSIN) 40 MG tablet Take 40 mg by mouth daily.    Historical Provider, MD  budesonide (PULMICORT) 0.5 MG/2ML nebulizer solution Take 0.5 mg by nebulization 2 (two) times daily.    Historical Provider, MD  calcium-vitamin D (OSCAL WITH D) 500-200 MG-UNIT per tablet Take 1 tablet by mouth 2 (two) times daily.     Historical Provider, MD  dextromethorphan-guaiFENesin (MUCINEX DM) 30-600 MG per 12 hr tablet Take 1 tablet by mouth 2 (two) times daily as needed (cough, congestion).    Historical Provider, MD  famotidine (PEPCID) 20 MG tablet Take 20 mg by mouth 2 (two)  times daily.    Historical Provider, MD  hydrochlorothiazide (HYDRODIURIL) 25 MG tablet Take 25 mg by mouth daily.    Historical Provider, MD  ibuprofen (ADVIL,MOTRIN) 600 MG tablet Take 600 mg by mouth 3 (three) times daily.    Historical Provider, MD  irbesartan (AVAPRO) 150 MG tablet Take 1 tablet (150 mg total) by mouth daily. 04/18/14   Tanda Rockers, MD  Multiple Vitamin (MULTIVITAMIN) capsule Take 1 capsule by mouth daily.    Historical Provider, MD  Multiple Vitamins-Minerals (ICAPS MV) TABS Take 1 tablet by mouth 2 (two) times daily.    Historical Provider, MD  oxyCODONE-acetaminophen  (PERCOCET/ROXICET) 5-325 MG tablet Take 1 tablet by mouth every 4 (four) hours as needed for severe pain. 06/30/15   Clayton Bibles, PA-C  OXYGEN-HELIUM IN Pt uses o2 2lpm with exertion as needed- [facility]    Historical Provider, MD  oxymetazoline (AFRIN) 0.05 % nasal spray Place 1 spray into both nostrils 2 (two) times daily as needed for congestion (for 3 days only).     Historical Provider, MD  predniSONE (DELTASONE) 20 MG tablet 2 tabs po daily x 4 days 10/06/15   Sherwood Gambler, MD  Probiotic CAPS Take 1 capsule by mouth daily.    Historical Provider, MD  psyllium (METAMUCIL) 58.6 % packet 1 tbsp by mouth once daily    Historical Provider, MD  Triamcinolone Acetonide (NASACORT ALLERGY 24HR NA) Place 2 sprays into the nose daily.    Historical Provider, MD    Family History History reviewed. No pertinent family history.  Social History Social History  Substance Use Topics  . Smoking status: Former Smoker    Packs/day: 0.50    Years: 25.00    Types: Cigarettes, Pipe, Cigars  . Smokeless tobacco: Never Used  . Alcohol use No     Allergies   Sulfa antibiotics   Review of Systems Review of Systems  Respiratory: Positive for cough, shortness of breath and wheezing.   All other systems reviewed and are negative.    Physical Exam Updated Vital Signs BP 159/84 (BP Location: Right Arm)   Pulse 69   Temp 97.6 F (36.4 C) (Oral)   Resp 18   Ht 5\' 7"  (1.702 m)   Wt 75.8 kg   SpO2 95%   BMI 26.16 kg/m   Physical Exam  Constitutional: He is oriented to person, place, and time. He appears well-developed and well-nourished.  HENT:  Head: Normocephalic and atraumatic.  Mouth/Throat: Oropharynx is clear and moist.  Eyes: Conjunctivae and EOM are normal. Pupils are equal, round, and reactive to light.  Neck: Normal range of motion.  No JVD  Cardiovascular: Normal rate, regular rhythm and normal heart sounds.   Pulmonary/Chest: Effort normal. No respiratory distress. He has wheezes.    2 L nasal cannula and use, diffuse expiratory wheezes throughout, able to speak in sentences, no apparent retractions  Abdominal: Soft. Bowel sounds are normal.  Musculoskeletal: Normal range of motion.  No peripheral edema  Neurological: He is alert and oriented to person, place, and time.  Skin: Skin is warm and dry.  Psychiatric: He has a normal mood and affect.  Nursing note and vitals reviewed.    ED Treatments / Results  Labs (all labs ordered are listed, but only abnormal results are displayed) Labs Reviewed  BASIC METABOLIC PANEL - Abnormal; Notable for the following:       Result Value   BUN 28 (*)    Creatinine, Ser 1.35 (*)  GFR calc non Af Amer 45 (*)    GFR calc Af Amer 52 (*)    All other components within normal limits  CBC - Abnormal; Notable for the following:    WBC 13.8 (*)    All other components within normal limits  I-STAT TROPOININ, ED    EKG  EKG Interpretation  Date/Time:  Saturday February 01 2016 11:54:57 EST Ventricular Rate:  74 PR Interval:    QRS Duration: 97 QT Interval:  433 QTC Calculation: 433 R Axis:   70 Text Interpretation:  Sinus rhythm Paired ventricular premature complexes Minimal ST elevation, anterior leads Baseline wander in lead(s) V1 V3 Poor data quality otherwise no significant change Confirmed by KNAPP  MD-J, JON ([id number]) on 02/01/2016 11:58:53 AM       Radiology Dg Chest 2 View  Result Date: 02/01/2016 CLINICAL DATA:  Shortness of Breath EXAM: CHEST  2 VIEW COMPARISON:  12/06/2015 FINDINGS: Cardiomediastinal silhouette is stable. Bilateral scarring and chronic interstitial prominence again noted. Stable bilateral calcified granulomas. No definite superimposed infiltrate or pulmonary edema. Osteopenia and mild degenerative changes thoracic spine. IMPRESSION: Bilateral scarring and chronic interstitial prominence again noted. Stable bilateral calcified granulomas. No definite superimposed infiltrate or pulmonary edema.  Electronically Signed   By: Lahoma Crocker M.D.   On: 02/01/2016 12:34    Procedures Procedures (including critical care time)  Medications Ordered in ED Medications  albuterol (PROVENTIL) (2.5 MG/3ML) 0.083% nebulizer solution 5 mg (5 mg Nebulization Given 02/01/16 1230)  ipratropium (ATROVENT) nebulizer solution 0.5 mg (0.5 mg Nebulization Given 02/01/16 1230)  methylPREDNISolone sodium succinate (SOLU-MEDROL) 125 mg/2 mL injection 125 mg (125 mg Intravenous Given 02/01/16 1230)     Initial Impression / Assessment and Plan / ED Course  I have reviewed the triage vital signs and the nursing notes.  Pertinent labs & imaging results that were available during my care of the patient were reviewed by me and considered in my medical decision making (see chart for details).  Clinical Course    82 year old male here with shortness of breath. Long-standing history of COPD, on PRN oxygen. He is afebrile and nontoxic. He is in no acute respiratory distress. He has diffuse expiratory wheezes throughout. He is able to speak in sentences without difficulty. He does not appear fluid overloaded.  Patient given solu-medrol as well as dose of albuterol/Atrovent neb. Labs chest x-ray pending. Will reassess.  Labwork with leukocytosis, this is likely from his recent prednisone use. Otherwise labs are reassuring. Troponin is negative. Patient's chest x-ray with chronic changes noted, no acute infiltrate or evidence of pulmonary edema.  Patient ambulated around on his baseline 3L O2 and maintained sats from 89-94%, mostly was able to stay in the 90's.  No dizziness or labored breathing while walking.  Patient continues to appear well. Given that he has access to oxygen and continues full time if needed, do not feel that he necessarily requires hospitalization at this time. Will restart prednisone taper. Given negative chest x-ray, do not feel another course of antibiotics will be beneficial. Will have him follow-up  with his primary care doctor.  Discussed plan with patient, he acknowledged understanding and agreed with plan of care.  Return precautions given for new or worsening symptoms.  Case discussed with attending physician, Dr. Tomi Bamberger, who evaluated patient and agrees with assessment and plan of care.  Final Clinical Impressions(s) / ED Diagnoses   Final diagnoses:  Shortness of breath  Chronic obstructive pulmonary disease, unspecified COPD type ([place])  Wheezing    New Prescriptions New Prescriptions   PREDNISONE (DELTASONE) 20 MG TABLET    Take 40 mg by mouth daily for 3 days, then 20mg  by mouth daily for 3 days, then 10mg  daily for 3 days     Larene Pickett, PA-C 02/01/16 1518    Dorie Rank, MD 02/02/16 [phone]

## 2016-02-01 NOTE — ED Notes (Signed)
Pt ambulated on 3 liters of oxygen; oxygen saturation ranged from 89%-94%; Lattie Haw, [place] notified

## 2016-02-01 NOTE — ED Notes (Signed)
EDP at beside  

## 2016-02-03 DIAGNOSIS — J449 Chronic obstructive pulmonary disease, unspecified: Secondary | ICD-10-CM | POA: Diagnosis not present

## 2016-02-05 DIAGNOSIS — J449 Chronic obstructive pulmonary disease, unspecified: Secondary | ICD-10-CM | POA: Diagnosis not present

## 2016-02-07 DIAGNOSIS — J449 Chronic obstructive pulmonary disease, unspecified: Secondary | ICD-10-CM | POA: Diagnosis not present

## 2016-02-11 DIAGNOSIS — E785 Hyperlipidemia, unspecified: Secondary | ICD-10-CM | POA: Diagnosis not present

## 2016-02-11 DIAGNOSIS — R0602 Shortness of breath: Secondary | ICD-10-CM | POA: Diagnosis not present

## 2016-02-11 DIAGNOSIS — J441 Chronic obstructive pulmonary disease with (acute) exacerbation: Secondary | ICD-10-CM | POA: Diagnosis not present

## 2016-02-11 DIAGNOSIS — J181 Lobar pneumonia, unspecified organism: Secondary | ICD-10-CM | POA: Diagnosis present

## 2016-02-11 DIAGNOSIS — Z79899 Other long term (current) drug therapy: Secondary | ICD-10-CM | POA: Diagnosis not present

## 2016-02-11 DIAGNOSIS — J440 Chronic obstructive pulmonary disease with acute lower respiratory infection: Secondary | ICD-10-CM | POA: Diagnosis not present

## 2016-02-11 DIAGNOSIS — Z8546 Personal history of malignant neoplasm of prostate: Secondary | ICD-10-CM | POA: Diagnosis not present

## 2016-02-11 DIAGNOSIS — A419 Sepsis, unspecified organism: Secondary | ICD-10-CM | POA: Diagnosis not present

## 2016-02-11 DIAGNOSIS — I10 Essential (primary) hypertension: Secondary | ICD-10-CM | POA: Diagnosis not present

## 2016-02-11 DIAGNOSIS — Z7982 Long term (current) use of aspirin: Secondary | ICD-10-CM | POA: Diagnosis not present

## 2016-02-11 DIAGNOSIS — J189 Pneumonia, unspecified organism: Secondary | ICD-10-CM | POA: Diagnosis not present

## 2016-02-11 DIAGNOSIS — Z882 Allergy status to sulfonamides status: Secondary | ICD-10-CM | POA: Diagnosis not present

## 2016-02-11 DIAGNOSIS — Z9981 Dependence on supplemental oxygen: Secondary | ICD-10-CM | POA: Diagnosis not present

## 2016-02-11 DIAGNOSIS — R05 Cough: Secondary | ICD-10-CM | POA: Diagnosis not present

## 2016-02-11 DIAGNOSIS — Z87891 Personal history of nicotine dependence: Secondary | ICD-10-CM | POA: Diagnosis not present

## 2016-02-14 DIAGNOSIS — Z9981 Dependence on supplemental oxygen: Secondary | ICD-10-CM | POA: Diagnosis not present

## 2016-02-14 DIAGNOSIS — J441 Chronic obstructive pulmonary disease with (acute) exacerbation: Secondary | ICD-10-CM | POA: Diagnosis not present

## 2016-02-14 DIAGNOSIS — J189 Pneumonia, unspecified organism: Secondary | ICD-10-CM | POA: Diagnosis not present

## 2016-02-14 DIAGNOSIS — R000 Tachycardia, unspecified: Secondary | ICD-10-CM | POA: Diagnosis not present

## 2016-02-14 DIAGNOSIS — Z7982 Long term (current) use of aspirin: Secondary | ICD-10-CM | POA: Diagnosis not present

## 2016-02-14 DIAGNOSIS — A419 Sepsis, unspecified organism: Secondary | ICD-10-CM | POA: Diagnosis not present

## 2016-02-14 DIAGNOSIS — I10 Essential (primary) hypertension: Secondary | ICD-10-CM | POA: Diagnosis not present

## 2016-02-14 DIAGNOSIS — Z79899 Other long term (current) drug therapy: Secondary | ICD-10-CM | POA: Diagnosis not present

## 2016-02-14 DIAGNOSIS — D649 Anemia, unspecified: Secondary | ICD-10-CM | POA: Diagnosis not present

## 2016-02-14 DIAGNOSIS — K4020 Bilateral inguinal hernia, without obstruction or gangrene, not specified as recurrent: Secondary | ICD-10-CM | POA: Diagnosis not present

## 2016-02-14 DIAGNOSIS — E785 Hyperlipidemia, unspecified: Secondary | ICD-10-CM | POA: Diagnosis not present

## 2016-02-14 DIAGNOSIS — K439 Ventral hernia without obstruction or gangrene: Secondary | ICD-10-CM | POA: Diagnosis not present

## 2016-02-14 DIAGNOSIS — K922 Gastrointestinal hemorrhage, unspecified: Secondary | ICD-10-CM | POA: Diagnosis not present

## 2016-02-15 DIAGNOSIS — I10 Essential (primary) hypertension: Secondary | ICD-10-CM | POA: Diagnosis not present

## 2016-02-15 DIAGNOSIS — D649 Anemia, unspecified: Secondary | ICD-10-CM | POA: Diagnosis not present

## 2016-02-15 DIAGNOSIS — E785 Hyperlipidemia, unspecified: Secondary | ICD-10-CM | POA: Diagnosis not present

## 2016-02-15 DIAGNOSIS — K922 Gastrointestinal hemorrhage, unspecified: Secondary | ICD-10-CM | POA: Diagnosis not present

## 2016-02-15 DIAGNOSIS — J441 Chronic obstructive pulmonary disease with (acute) exacerbation: Secondary | ICD-10-CM | POA: Diagnosis not present

## 2016-02-15 DIAGNOSIS — J189 Pneumonia, unspecified organism: Secondary | ICD-10-CM | POA: Diagnosis not present

## 2016-02-16 DIAGNOSIS — K921 Melena: Secondary | ICD-10-CM | POA: Diagnosis not present

## 2016-02-16 DIAGNOSIS — D649 Anemia, unspecified: Secondary | ICD-10-CM | POA: Diagnosis not present

## 2016-02-16 DIAGNOSIS — R195 Other fecal abnormalities: Secondary | ICD-10-CM | POA: Diagnosis not present

## 2016-02-16 DIAGNOSIS — J441 Chronic obstructive pulmonary disease with (acute) exacerbation: Secondary | ICD-10-CM | POA: Diagnosis not present

## 2016-02-17 DIAGNOSIS — J441 Chronic obstructive pulmonary disease with (acute) exacerbation: Secondary | ICD-10-CM | POA: Diagnosis not present

## 2016-02-17 DIAGNOSIS — K921 Melena: Secondary | ICD-10-CM | POA: Diagnosis not present

## 2016-02-17 DIAGNOSIS — R262 Difficulty in walking, not elsewhere classified: Secondary | ICD-10-CM | POA: Diagnosis not present

## 2016-02-17 DIAGNOSIS — D649 Anemia, unspecified: Secondary | ICD-10-CM | POA: Diagnosis not present

## 2016-02-17 DIAGNOSIS — R195 Other fecal abnormalities: Secondary | ICD-10-CM | POA: Diagnosis not present

## 2016-02-17 DIAGNOSIS — R05 Cough: Secondary | ICD-10-CM | POA: Diagnosis not present

## 2016-02-17 DIAGNOSIS — J189 Pneumonia, unspecified organism: Secondary | ICD-10-CM | POA: Diagnosis not present

## 2016-02-17 DIAGNOSIS — I10 Essential (primary) hypertension: Secondary | ICD-10-CM | POA: Diagnosis not present

## 2016-02-17 DIAGNOSIS — J9621 Acute and chronic respiratory failure with hypoxia: Secondary | ICD-10-CM | POA: Diagnosis not present

## 2016-02-17 DIAGNOSIS — K922 Gastrointestinal hemorrhage, unspecified: Secondary | ICD-10-CM | POA: Diagnosis not present

## 2016-02-17 DIAGNOSIS — E785 Hyperlipidemia, unspecified: Secondary | ICD-10-CM | POA: Diagnosis not present

## 2016-02-29 DIAGNOSIS — I10 Essential (primary) hypertension: Secondary | ICD-10-CM | POA: Diagnosis not present

## 2016-02-29 DIAGNOSIS — J441 Chronic obstructive pulmonary disease with (acute) exacerbation: Secondary | ICD-10-CM | POA: Diagnosis not present

## 2016-02-29 DIAGNOSIS — Z9981 Dependence on supplemental oxygen: Secondary | ICD-10-CM | POA: Diagnosis not present

## 2016-02-29 DIAGNOSIS — Z7982 Long term (current) use of aspirin: Secondary | ICD-10-CM | POA: Diagnosis not present

## 2016-03-03 DIAGNOSIS — I10 Essential (primary) hypertension: Secondary | ICD-10-CM | POA: Diagnosis not present

## 2016-03-03 DIAGNOSIS — Z7982 Long term (current) use of aspirin: Secondary | ICD-10-CM | POA: Diagnosis not present

## 2016-03-03 DIAGNOSIS — Z9981 Dependence on supplemental oxygen: Secondary | ICD-10-CM | POA: Diagnosis not present

## 2016-03-03 DIAGNOSIS — J441 Chronic obstructive pulmonary disease with (acute) exacerbation: Secondary | ICD-10-CM | POA: Diagnosis not present

## 2016-03-04 DIAGNOSIS — Z9981 Dependence on supplemental oxygen: Secondary | ICD-10-CM | POA: Diagnosis not present

## 2016-03-04 DIAGNOSIS — I10 Essential (primary) hypertension: Secondary | ICD-10-CM | POA: Diagnosis not present

## 2016-03-04 DIAGNOSIS — J441 Chronic obstructive pulmonary disease with (acute) exacerbation: Secondary | ICD-10-CM | POA: Diagnosis not present

## 2016-03-04 DIAGNOSIS — Z7982 Long term (current) use of aspirin: Secondary | ICD-10-CM | POA: Diagnosis not present

## 2016-03-06 DIAGNOSIS — Z9981 Dependence on supplemental oxygen: Secondary | ICD-10-CM | POA: Diagnosis not present

## 2016-03-06 DIAGNOSIS — I10 Essential (primary) hypertension: Secondary | ICD-10-CM | POA: Diagnosis not present

## 2016-03-06 DIAGNOSIS — Z7982 Long term (current) use of aspirin: Secondary | ICD-10-CM | POA: Diagnosis not present

## 2016-03-06 DIAGNOSIS — J441 Chronic obstructive pulmonary disease with (acute) exacerbation: Secondary | ICD-10-CM | POA: Diagnosis not present

## 2016-03-10 DIAGNOSIS — Z7982 Long term (current) use of aspirin: Secondary | ICD-10-CM | POA: Diagnosis not present

## 2016-03-10 DIAGNOSIS — I10 Essential (primary) hypertension: Secondary | ICD-10-CM | POA: Diagnosis not present

## 2016-03-10 DIAGNOSIS — Z9981 Dependence on supplemental oxygen: Secondary | ICD-10-CM | POA: Diagnosis not present

## 2016-03-10 DIAGNOSIS — J441 Chronic obstructive pulmonary disease with (acute) exacerbation: Secondary | ICD-10-CM | POA: Diagnosis not present

## 2016-03-13 DIAGNOSIS — Z9981 Dependence on supplemental oxygen: Secondary | ICD-10-CM | POA: Diagnosis not present

## 2016-03-13 DIAGNOSIS — Z7982 Long term (current) use of aspirin: Secondary | ICD-10-CM | POA: Diagnosis not present

## 2016-03-13 DIAGNOSIS — I10 Essential (primary) hypertension: Secondary | ICD-10-CM | POA: Diagnosis not present

## 2016-03-13 DIAGNOSIS — J441 Chronic obstructive pulmonary disease with (acute) exacerbation: Secondary | ICD-10-CM | POA: Diagnosis not present

## 2016-03-16 DIAGNOSIS — M791 Myalgia: Secondary | ICD-10-CM | POA: Diagnosis not present

## 2016-03-16 DIAGNOSIS — Z09 Encounter for follow-up examination after completed treatment for conditions other than malignant neoplasm: Secondary | ICD-10-CM | POA: Diagnosis not present

## 2016-03-16 DIAGNOSIS — R531 Weakness: Secondary | ICD-10-CM | POA: Diagnosis not present

## 2016-03-16 DIAGNOSIS — J189 Pneumonia, unspecified organism: Secondary | ICD-10-CM | POA: Diagnosis not present

## 2016-03-17 DIAGNOSIS — Z9981 Dependence on supplemental oxygen: Secondary | ICD-10-CM | POA: Diagnosis not present

## 2016-03-17 DIAGNOSIS — Z7982 Long term (current) use of aspirin: Secondary | ICD-10-CM | POA: Diagnosis not present

## 2016-03-17 DIAGNOSIS — I10 Essential (primary) hypertension: Secondary | ICD-10-CM | POA: Diagnosis not present

## 2016-03-17 DIAGNOSIS — J441 Chronic obstructive pulmonary disease with (acute) exacerbation: Secondary | ICD-10-CM | POA: Diagnosis not present

## 2016-03-18 DIAGNOSIS — I493 Ventricular premature depolarization: Secondary | ICD-10-CM | POA: Diagnosis not present

## 2016-03-18 DIAGNOSIS — I10 Essential (primary) hypertension: Secondary | ICD-10-CM | POA: Diagnosis not present

## 2016-03-18 DIAGNOSIS — J449 Chronic obstructive pulmonary disease, unspecified: Secondary | ICD-10-CM | POA: Diagnosis not present

## 2016-03-18 DIAGNOSIS — R0602 Shortness of breath: Secondary | ICD-10-CM | POA: Diagnosis not present

## 2016-03-31 DIAGNOSIS — J449 Chronic obstructive pulmonary disease, unspecified: Secondary | ICD-10-CM | POA: Diagnosis not present

## 2016-03-31 DIAGNOSIS — I2510 Atherosclerotic heart disease of native coronary artery without angina pectoris: Secondary | ICD-10-CM | POA: Diagnosis not present

## 2016-03-31 DIAGNOSIS — F411 Generalized anxiety disorder: Secondary | ICD-10-CM | POA: Diagnosis not present

## 2016-03-31 DIAGNOSIS — E782 Mixed hyperlipidemia: Secondary | ICD-10-CM | POA: Diagnosis not present

## 2016-04-01 DIAGNOSIS — J441 Chronic obstructive pulmonary disease with (acute) exacerbation: Secondary | ICD-10-CM | POA: Diagnosis not present

## 2016-04-01 DIAGNOSIS — I10 Essential (primary) hypertension: Secondary | ICD-10-CM | POA: Diagnosis not present

## 2016-04-01 DIAGNOSIS — Z7982 Long term (current) use of aspirin: Secondary | ICD-10-CM | POA: Diagnosis not present

## 2016-04-01 DIAGNOSIS — Z9981 Dependence on supplemental oxygen: Secondary | ICD-10-CM | POA: Diagnosis not present

## 2016-04-07 DIAGNOSIS — J441 Chronic obstructive pulmonary disease with (acute) exacerbation: Secondary | ICD-10-CM | POA: Diagnosis not present

## 2016-04-07 DIAGNOSIS — Z9981 Dependence on supplemental oxygen: Secondary | ICD-10-CM | POA: Diagnosis not present

## 2016-04-07 DIAGNOSIS — Z7982 Long term (current) use of aspirin: Secondary | ICD-10-CM | POA: Diagnosis not present

## 2016-04-07 DIAGNOSIS — I10 Essential (primary) hypertension: Secondary | ICD-10-CM | POA: Diagnosis not present

## 2016-04-27 DIAGNOSIS — C61 Malignant neoplasm of prostate: Secondary | ICD-10-CM | POA: Diagnosis not present

## 2016-04-27 DIAGNOSIS — N3946 Mixed incontinence: Secondary | ICD-10-CM | POA: Diagnosis not present

## 2016-05-04 DIAGNOSIS — F411 Generalized anxiety disorder: Secondary | ICD-10-CM | POA: Diagnosis not present

## 2016-05-04 DIAGNOSIS — J441 Chronic obstructive pulmonary disease with (acute) exacerbation: Secondary | ICD-10-CM | POA: Diagnosis not present

## 2016-05-21 DIAGNOSIS — J449 Chronic obstructive pulmonary disease, unspecified: Secondary | ICD-10-CM | POA: Diagnosis not present

## 2016-05-21 DIAGNOSIS — F411 Generalized anxiety disorder: Secondary | ICD-10-CM | POA: Diagnosis not present

## 2016-06-09 DIAGNOSIS — E782 Mixed hyperlipidemia: Secondary | ICD-10-CM | POA: Diagnosis not present

## 2016-06-09 DIAGNOSIS — I2510 Atherosclerotic heart disease of native coronary artery without angina pectoris: Secondary | ICD-10-CM | POA: Diagnosis not present

## 2016-06-16 DIAGNOSIS — E782 Mixed hyperlipidemia: Secondary | ICD-10-CM | POA: Diagnosis not present

## 2016-06-16 DIAGNOSIS — I2510 Atherosclerotic heart disease of native coronary artery without angina pectoris: Secondary | ICD-10-CM | POA: Diagnosis not present

## 2016-06-16 DIAGNOSIS — J449 Chronic obstructive pulmonary disease, unspecified: Secondary | ICD-10-CM | POA: Diagnosis not present

## 2016-07-11 DIAGNOSIS — J209 Acute bronchitis, unspecified: Secondary | ICD-10-CM | POA: Diagnosis not present

## 2016-07-16 DIAGNOSIS — R0602 Shortness of breath: Secondary | ICD-10-CM | POA: Diagnosis not present

## 2016-07-16 DIAGNOSIS — J440 Chronic obstructive pulmonary disease with acute lower respiratory infection: Secondary | ICD-10-CM | POA: Diagnosis not present

## 2016-07-16 DIAGNOSIS — J159 Unspecified bacterial pneumonia: Secondary | ICD-10-CM | POA: Diagnosis not present

## 2016-07-16 DIAGNOSIS — R0600 Dyspnea, unspecified: Secondary | ICD-10-CM | POA: Diagnosis not present

## 2016-07-17 DIAGNOSIS — J441 Chronic obstructive pulmonary disease with (acute) exacerbation: Secondary | ICD-10-CM | POA: Diagnosis not present

## 2016-07-17 DIAGNOSIS — Z7982 Long term (current) use of aspirin: Secondary | ICD-10-CM | POA: Diagnosis not present

## 2016-07-17 DIAGNOSIS — I10 Essential (primary) hypertension: Secondary | ICD-10-CM | POA: Diagnosis not present

## 2016-07-17 DIAGNOSIS — Z87891 Personal history of nicotine dependence: Secondary | ICD-10-CM | POA: Diagnosis not present

## 2016-07-17 DIAGNOSIS — F419 Anxiety disorder, unspecified: Secondary | ICD-10-CM | POA: Diagnosis present

## 2016-07-17 DIAGNOSIS — J159 Unspecified bacterial pneumonia: Secondary | ICD-10-CM | POA: Diagnosis not present

## 2016-07-17 DIAGNOSIS — J9610 Chronic respiratory failure, unspecified whether with hypoxia or hypercapnia: Secondary | ICD-10-CM | POA: Diagnosis present

## 2016-07-17 DIAGNOSIS — Z23 Encounter for immunization: Secondary | ICD-10-CM | POA: Diagnosis not present

## 2016-07-17 DIAGNOSIS — Z9981 Dependence on supplemental oxygen: Secondary | ICD-10-CM | POA: Diagnosis not present

## 2016-07-17 DIAGNOSIS — J189 Pneumonia, unspecified organism: Secondary | ICD-10-CM | POA: Diagnosis not present

## 2016-07-17 DIAGNOSIS — Z79899 Other long term (current) drug therapy: Secondary | ICD-10-CM | POA: Diagnosis not present

## 2016-07-17 DIAGNOSIS — R5381 Other malaise: Secondary | ICD-10-CM | POA: Diagnosis present

## 2016-07-17 DIAGNOSIS — R0600 Dyspnea, unspecified: Secondary | ICD-10-CM | POA: Diagnosis not present

## 2016-07-17 DIAGNOSIS — J440 Chronic obstructive pulmonary disease with acute lower respiratory infection: Secondary | ICD-10-CM | POA: Diagnosis not present

## 2016-07-17 DIAGNOSIS — R0602 Shortness of breath: Secondary | ICD-10-CM | POA: Diagnosis not present

## 2016-07-17 DIAGNOSIS — E785 Hyperlipidemia, unspecified: Secondary | ICD-10-CM | POA: Diagnosis not present

## 2016-07-17 DIAGNOSIS — Z7952 Long term (current) use of systemic steroids: Secondary | ICD-10-CM | POA: Diagnosis not present

## 2016-07-20 ENCOUNTER — Emergency Department (HOSPITAL_COMMUNITY): Payer: Medicare Other

## 2016-07-20 ENCOUNTER — Encounter (HOSPITAL_COMMUNITY): Payer: Self-pay | Admitting: Emergency Medicine

## 2016-07-20 ENCOUNTER — Inpatient Hospital Stay (HOSPITAL_COMMUNITY)
Admission: EM | Admit: 2016-07-20 | Discharge: 2016-07-22 | DRG: 190 | Disposition: A | Discharge status: Home Health | Payer: Medicare Other | Attending: Internal Medicine | Admitting: Internal Medicine

## 2016-07-20 DIAGNOSIS — N182 Chronic kidney disease, stage 2 (mild): Secondary | ICD-10-CM | POA: Diagnosis not present

## 2016-07-20 DIAGNOSIS — Z87891 Personal history of nicotine dependence: Secondary | ICD-10-CM

## 2016-07-20 DIAGNOSIS — J189 Pneumonia, unspecified organism: Secondary | ICD-10-CM | POA: Diagnosis present

## 2016-07-20 DIAGNOSIS — J9611 Chronic respiratory failure with hypoxia: Secondary | ICD-10-CM | POA: Diagnosis present

## 2016-07-20 DIAGNOSIS — I2510 Atherosclerotic heart disease of native coronary artery without angina pectoris: Secondary | ICD-10-CM | POA: Diagnosis present

## 2016-07-20 DIAGNOSIS — Z9981 Dependence on supplemental oxygen: Secondary | ICD-10-CM

## 2016-07-20 DIAGNOSIS — R0602 Shortness of breath: Secondary | ICD-10-CM | POA: Diagnosis not present

## 2016-07-20 DIAGNOSIS — I480 Paroxysmal atrial fibrillation: Secondary | ICD-10-CM | POA: Diagnosis not present

## 2016-07-20 DIAGNOSIS — Z882 Allergy status to sulfonamides status: Secondary | ICD-10-CM

## 2016-07-20 DIAGNOSIS — I493 Ventricular premature depolarization: Secondary | ICD-10-CM | POA: Diagnosis present

## 2016-07-20 DIAGNOSIS — K219 Gastro-esophageal reflux disease without esophagitis: Secondary | ICD-10-CM | POA: Diagnosis present

## 2016-07-20 DIAGNOSIS — Z8249 Family history of ischemic heart disease and other diseases of the circulatory system: Secondary | ICD-10-CM

## 2016-07-20 DIAGNOSIS — J440 Chronic obstructive pulmonary disease with acute lower respiratory infection: Secondary | ICD-10-CM | POA: Diagnosis present

## 2016-07-20 DIAGNOSIS — R0603 Acute respiratory distress: Secondary | ICD-10-CM | POA: Diagnosis not present

## 2016-07-20 DIAGNOSIS — I471 Supraventricular tachycardia, unspecified: Secondary | ICD-10-CM

## 2016-07-20 DIAGNOSIS — Z8546 Personal history of malignant neoplasm of prostate: Secondary | ICD-10-CM

## 2016-07-20 DIAGNOSIS — J441 Chronic obstructive pulmonary disease with (acute) exacerbation: Principal | ICD-10-CM | POA: Diagnosis present

## 2016-07-20 DIAGNOSIS — E784 Other hyperlipidemia: Secondary | ICD-10-CM | POA: Diagnosis not present

## 2016-07-20 DIAGNOSIS — E785 Hyperlipidemia, unspecified: Secondary | ICD-10-CM | POA: Diagnosis present

## 2016-07-20 DIAGNOSIS — Z7951 Long term (current) use of inhaled steroids: Secondary | ICD-10-CM

## 2016-07-20 DIAGNOSIS — F419 Anxiety disorder, unspecified: Secondary | ICD-10-CM | POA: Diagnosis present

## 2016-07-20 DIAGNOSIS — Z7982 Long term (current) use of aspirin: Secondary | ICD-10-CM

## 2016-07-20 DIAGNOSIS — I472 Ventricular tachycardia: Secondary | ICD-10-CM | POA: Diagnosis not present

## 2016-07-20 DIAGNOSIS — I129 Hypertensive chronic kidney disease with stage 1 through stage 4 chronic kidney disease, or unspecified chronic kidney disease: Secondary | ICD-10-CM | POA: Diagnosis present

## 2016-07-20 DIAGNOSIS — Z8701 Personal history of pneumonia (recurrent): Secondary | ICD-10-CM

## 2016-07-20 DIAGNOSIS — Z79899 Other long term (current) drug therapy: Secondary | ICD-10-CM

## 2016-07-20 DIAGNOSIS — N183 Chronic kidney disease, stage 3 (moderate): Secondary | ICD-10-CM | POA: Diagnosis present

## 2016-07-20 HISTORY — DX: Chronic kidney disease, stage 2 (mild): N18.2

## 2016-07-20 HISTORY — DX: Unspecified abdominal hernia without obstruction or gangrene: K46.9

## 2016-07-20 HISTORY — DX: Gastro-esophageal reflux disease without esophagitis: K21.9

## 2016-07-20 LAB — CBC
HEMATOCRIT: 44.3 % (ref 39.0–52.0)
HEMOGLOBIN: 14.9 g/dL (ref 13.0–17.0)
MCH: 30.1 pg (ref 26.0–34.0)
MCHC: 33.6 g/dL (ref 30.0–36.0)
MCV: 89.5 fL (ref 78.0–100.0)
Platelets: 261 10*3/uL (ref 150–400)
RBC: 4.95 MIL/uL (ref 4.22–5.81)
RDW: 15.4 % (ref 11.5–15.5)
WBC: 13.8 10*3/uL — ABNORMAL HIGH (ref 4.0–10.5)

## 2016-07-20 LAB — BASIC METABOLIC PANEL
ANION GAP: 12 (ref 5–15)
BUN: 36 mg/dL — ABNORMAL HIGH (ref 6–20)
CHLORIDE: 98 mmol/L — AB (ref 101–111)
CO2: 28 mmol/L (ref 22–32)
Calcium: 9.3 mg/dL (ref 8.9–10.3)
Creatinine, Ser: 1.29 mg/dL — ABNORMAL HIGH (ref 0.61–1.24)
GFR calc non Af Amer: 47 mL/min — ABNORMAL LOW (ref >60)
GFR, EST AFRICAN AMERICAN: 55 mL/min — AB (ref >60)
GLUCOSE: 81 mg/dL (ref 65–99)
POTASSIUM: 3.6 mmol/L (ref 3.5–5.1)
Sodium: 138 mmol/L (ref 135–145)

## 2016-07-20 LAB — PROCALCITONIN: PROCALCITONIN: 0.1 ng/mL

## 2016-07-20 MED ORDER — ALPRAZOLAM 0.25 MG PO TABS
0.2500 mg | ORAL_TABLET | Freq: Three times a day (TID) | ORAL | Status: DC
  Administered 2016-07-20 – 2016-07-22 (×7): 0.25 mg via ORAL
  Filled 2016-07-20 (×7): qty 1

## 2016-07-20 MED ORDER — ARFORMOTEROL TARTRATE 15 MCG/2ML IN NEBU: 15.0000 ug | INHALATION_SOLUTION | Freq: Two times a day (BID) | RESPIRATORY_TRACT | Status: DC

## 2016-07-20 MED ORDER — TIOTROPIUM BROMIDE MONOHYDRATE 18 MCG IN CAPS
ORAL_CAPSULE | Freq: Every day | RESPIRATORY_TRACT | Status: DC
  Filled 2016-07-20: qty 5

## 2016-07-20 MED ORDER — LEVOFLOXACIN 750 MG PO TABS
750.0000 mg | ORAL_TABLET | Freq: Once | ORAL | Status: AC
  Administered 2016-07-20: 750 mg via ORAL
  Filled 2016-07-20: qty 1

## 2016-07-20 MED ORDER — BUDESONIDE 0.5 MG/2ML IN SUSP
0.5000 mg | Freq: Two times a day (BID) | RESPIRATORY_TRACT | Status: DC
  Administered 2016-07-20 – 2016-07-22 (×4): 0.5 mg via RESPIRATORY_TRACT
  Filled 2016-07-20 (×4): qty 2

## 2016-07-20 MED ORDER — ACETAMINOPHEN 325 MG PO TABS
650.0000 mg | ORAL_TABLET | Freq: Four times a day (QID) | ORAL | Status: DC | PRN
Start: 2016-07-20 — End: 2016-07-22

## 2016-07-20 MED ORDER — FAMOTIDINE 20 MG PO TABS
20.0000 mg | ORAL_TABLET | Freq: Every day | ORAL | Status: DC
  Administered 2016-07-20 – 2016-07-21 (×2): 20 mg via ORAL
  Filled 2016-07-20 (×2): qty 1

## 2016-07-20 MED ORDER — SODIUM CHLORIDE 0.9 % IV SOLN
INTRAVENOUS | Status: DC
  Administered 2016-07-20 – 2016-07-21 (×3): via INTRAVENOUS

## 2016-07-20 MED ORDER — ASPIRIN EC 81 MG PO TBEC
81.0000 mg | DELAYED_RELEASE_TABLET | Freq: Every day | ORAL | Status: DC
  Administered 2016-07-20 – 2016-07-22 (×3): 81 mg via ORAL
  Filled 2016-07-20 (×3): qty 1

## 2016-07-20 MED ORDER — BENAZEPRIL HCL 40 MG PO TABS
40.0000 mg | ORAL_TABLET | Freq: Two times a day (BID) | ORAL | Status: DC
  Administered 2016-07-20 – 2016-07-22 (×4): 40 mg via ORAL
  Filled 2016-07-20 (×5): qty 1

## 2016-07-20 MED ORDER — ACETAMINOPHEN 650 MG RE SUPP: 650.0000 mg | Freq: Four times a day (QID) | RECTAL | Status: DC | PRN

## 2016-07-20 MED ORDER — IPRATROPIUM-ALBUTEROL 0.5-2.5 (3) MG/3ML IN SOLN
3.0000 mL | Freq: Four times a day (QID) | RESPIRATORY_TRACT | Status: DC
  Administered 2016-07-21 – 2016-07-22 (×5): 3 mL via RESPIRATORY_TRACT
  Filled 2016-07-20 (×5): qty 3

## 2016-07-20 MED ORDER — IPRATROPIUM-ALBUTEROL 0.5-2.5 (3) MG/3ML IN SOLN
3.0000 mL | RESPIRATORY_TRACT | Status: DC
  Administered 2016-07-20 (×2): 3 mL via RESPIRATORY_TRACT
  Filled 2016-07-20 (×2): qty 3

## 2016-07-20 MED ORDER — ALBUTEROL SULFATE (2.5 MG/3ML) 0.083% IN NEBU: 2.5000 mg | INHALATION_SOLUTION | RESPIRATORY_TRACT | Status: DC | PRN

## 2016-07-20 MED ORDER — ARFORMOTEROL TARTRATE 15 MCG/2ML IN NEBU
15.0000 ug | INHALATION_SOLUTION | Freq: Two times a day (BID) | RESPIRATORY_TRACT | Status: DC
  Administered 2016-07-20 – 2016-07-22 (×4): 15 ug via RESPIRATORY_TRACT
  Filled 2016-07-20 (×4): qty 2

## 2016-07-20 MED ORDER — IPRATROPIUM-ALBUTEROL 0.5-2.5 (3) MG/3ML IN SOLN
3.0000 mL | Freq: Once | RESPIRATORY_TRACT | Status: AC
  Administered 2016-07-20: 3 mL via RESPIRATORY_TRACT
  Filled 2016-07-20: qty 3

## 2016-07-20 MED ORDER — METHYLPREDNISOLONE SODIUM SUCC 125 MG IJ SOLR
80.0000 mg | Freq: Once | INTRAMUSCULAR | Status: AC
  Administered 2016-07-20: 80 mg via INTRAMUSCULAR
  Filled 2016-07-20: qty 2

## 2016-07-20 MED ORDER — ONDANSETRON HCL 4 MG/2ML IJ SOLN: 4.0000 mg | Freq: Four times a day (QID) | INTRAMUSCULAR | Status: DC | PRN

## 2016-07-20 MED ORDER — FLUTICASONE PROPIONATE 50 MCG/ACT NA SUSP
1.0000 | Freq: Every day | NASAL | Status: DC
  Administered 2016-07-20 – 2016-07-22 (×3): 1 via NASAL
  Filled 2016-07-20: qty 16

## 2016-07-20 MED ORDER — ATORVASTATIN CALCIUM 40 MG PO TABS
40.0000 mg | ORAL_TABLET | ORAL | Status: DC
  Administered 2016-07-20 – 2016-07-22 (×2): 40 mg via ORAL
  Filled 2016-07-20 (×3): qty 1

## 2016-07-20 MED ORDER — LEVOFLOXACIN 500 MG PO TABS
500.0000 mg | ORAL_TABLET | Freq: Every day | ORAL | Status: DC
  Administered 2016-07-21 – 2016-07-22 (×2): 500 mg via ORAL
  Filled 2016-07-20 (×2): qty 1

## 2016-07-20 MED ORDER — HYDROCHLOROTHIAZIDE 25 MG PO TABS
25.0000 mg | ORAL_TABLET | Freq: Every day | ORAL | Status: DC
  Administered 2016-07-20 – 2016-07-22 (×3): 25 mg via ORAL
  Filled 2016-07-20 (×3): qty 1

## 2016-07-20 MED ORDER — ALBUTEROL SULFATE (2.5 MG/3ML) 0.083% IN NEBU
5.0000 mg | INHALATION_SOLUTION | Freq: Once | RESPIRATORY_TRACT | Status: DC
  Filled 2016-07-20: qty 6

## 2016-07-20 MED ORDER — METOPROLOL-HYDROCHLOROTHIAZIDE 50-25 MG PO TABS: 1.0000 | ORAL_TABLET | Freq: Every day | ORAL | Status: DC

## 2016-07-20 MED ORDER — ENOXAPARIN SODIUM 40 MG/0.4ML ~~LOC~~ SOLN
40.0000 mg | SUBCUTANEOUS | Status: DC
  Administered 2016-07-20 – 2016-07-21 (×2): 40 mg via SUBCUTANEOUS
  Filled 2016-07-20 (×3): qty 0.4

## 2016-07-20 MED ORDER — TIOTROPIUM BROMIDE MONOHYDRATE 18 MCG IN CAPS
18.0000 ug | ORAL_CAPSULE | Freq: Every day | RESPIRATORY_TRACT | Status: DC
  Filled 2016-07-20: qty 5

## 2016-07-20 MED ORDER — METOPROLOL TARTRATE 50 MG PO TABS
50.0000 mg | ORAL_TABLET | Freq: Every day | ORAL | Status: DC
  Administered 2016-07-20 – 2016-07-21 (×2): 50 mg via ORAL
  Filled 2016-07-20 (×2): qty 1

## 2016-07-20 MED ORDER — ONDANSETRON HCL 4 MG PO TABS: 4.0000 mg | ORAL_TABLET | Freq: Four times a day (QID) | ORAL | Status: DC | PRN

## 2016-07-20 MED ORDER — PREDNISONE 50 MG PO TABS
60.0000 mg | ORAL_TABLET | Freq: Every day | ORAL | Status: DC
  Administered 2016-07-21: 08:00:00 60 mg via ORAL
  Filled 2016-07-20: qty 1

## 2016-07-20 MED ORDER — METOPROLOL TARTRATE 5 MG/5ML IV SOLN: 2.5000 mg | INTRAVENOUS | Status: DC | PRN

## 2016-07-20 NOTE — ED Provider Notes (Signed)
[facility] DEPT Provider Note   CSN: [id number] Arrival date & time: 07/20/16  0708     History   Chief Complaint Chief Complaint  Patient presents with  . COPD    HPI COBURN KNAUS is a 83 y.o. male.  HPI   Patient is a 83 year old male with history of COPD on 2 L nasal cannula at home, hypertension and A. fib who presents to the ED with complaint of worsening shortness of breath and nasal congestion. Patient reports he was initially evaluated in the rash were ED 2 days ago for his symptoms. He states he was noted to have mild pneumonia and was admitted to the hospital overnight where he received treatment with antibiotics and steroids. Patient reports feeling better yesterday evening when he was discharged home but states this morning when he woke up his symptoms worsen. He reports having worsening shortness of breath, wheezing, productive cough (yellow mucous), nasal congestion, postnasal drip and sinus pressure. He states he is unable to walk more than a few steps at home du to his worsening SOB and fatigue. Patient denies taking any medications or using any of his home neb treatments since being discharged home last night. Denies fever, chills, headache, dizziness, hemoptysis, CP, palpitations, abdominal pain, vomiting, diarrhea, urinary sxs, leg swelling, weakness. Denies use of anticoagulants. Pt denies smoking.   Past Medical History:  Diagnosis Date  . Asthma   . Atrial fibrillation ([place])   . Cancer [facility])    prostate - sp radiation w/o recurrence  . COPD (chronic obstructive pulmonary disease) ([place])   . H/O hiatal hernia   . Hernia of abdominal cavity   . Hypertension   . Pneumonia   . Recurrent upper respiratory infection (URI)   . Shortness of breath     Patient Active Problem List   Diagnosis Date Noted  . Respiratory distress 07/20/2016  . Dysuria 12/14/2013  . Chronic rhinitis 11/02/2013  . Chronic respiratory failure with hypoxia ([place]) 08/21/2012  .  Dysphagia 03/25/2011    Class: Acute  . COPD exacerbation ([place]) 03/22/2011  . CAP (community acquired pneumonia) 03/22/2011  . PROSTATE CANCER 03/28/2010  . Upper airway cough syndrome 03/28/2010  . HYPERLIPIDEMIA 02/13/2009  . Essential hypertension 02/13/2009  . COPD GOLD III 02/13/2009  . HERN UNS SITE ABD CAV W/O MENTION OBST/GANGREN 02/13/2009    Past Surgical History:  Procedure Laterality Date  . COLON SURGERY         Home Medications    Prior to Admission medications   Medication Sig Start Date End Date Taking? Authorizing Provider  ALPRAZolam (XANAX) 0.25 MG tablet Take 0.25 mg by mouth 3 (three) times daily.   Yes [provider]  arformoterol (BROVANA) 15 MCG/2ML NEBU Take 15 mcg by nebulization 2 (two) times daily.   Yes [provider]  aspirin 81 MG tablet Take 81 mg by mouth daily.    Yes [provider]  atorvastatin (LIPITOR) 80 MG tablet Take 40 mg by mouth every Monday, Wednesday, and Friday.    Yes [provider]  benazepril (LOTENSIN) 40 MG tablet Take 40 mg by mouth 2 (two) times daily.    Yes [provider]  budesonide (PULMICORT) 0.5 MG/2ML nebulizer solution Take 0.5 mg by nebulization 2 (two) times daily.   Yes [provider]  cefdinir (OMNICEF) 300 MG capsule Take 300 mg by mouth 2 (two) times daily. 07/19/16 07/23/16 Yes [provider]  famotidine (PEPCID) 20 MG tablet Take 20 mg  by mouth at bedtime.    Yes [provider]  metoprolol-hydrochlorothiazide (LOPRESSOR HCT) 50-25 MG tablet Take 1 tablet by mouth daily.   Yes [provider]  Multiple Vitamin (MULTIVITAMIN) capsule Take 1 capsule by mouth daily.   Yes [provider]  Multiple Vitamins-Minerals (ICAPS MV) TABS Take 1 tablet by mouth daily.    Yes [provider]  OXYGEN-HELIUM IN Pt uses o2 2lpm with exertion as needed- [facility]   Yes [provider]  predniSONE (DELTASONE) 20 MG tablet  Take 40 mg by mouth daily for 3 days, then 20mg  by mouth daily for 3 days, then 10mg  daily for 3 days Patient taking differently: Take 5 mg by mouth daily.  02/01/16  Yes Larene Pickett, PA-C  Probiotic CAPS Take 1 capsule by mouth daily.   Yes [provider]  Tiotropium Bromide Monohydrate (SPIRIVA RESPIMAT IN) Inhale 2 puffs into the lungs daily.   Yes [provider]  albuterol (PROVENTIL) (2.5 MG/3ML) 0.083% nebulizer solution Take 3 mLs (2.5 mg total) by nebulization every 6 (six) hours as needed for wheezing or shortness of breath. Patient not taking: Reported on 07/20/2016 08/28/14   Tanda Rockers, MD  irbesartan (AVAPRO) 150 MG tablet Take 1 tablet (150 mg total) by mouth daily. Patient not taking: Reported on 07/20/2016 04/18/14   Tanda Rockers, MD  oxyCODONE-acetaminophen (PERCOCET/ROXICET) 5-325 MG tablet Take 1 tablet by mouth every 4 (four) hours as needed for severe pain. Patient not taking: Reported on 07/20/2016 06/30/15   Clayton Bibles, PA-C    Family History Family History  Problem Relation Age of Onset  . Heart attack Father     Social History Social History  Substance Use Topics  . Smoking status: Former Smoker    Packs/day: 0.50    Years: 25.00    Types: Cigarettes, Pipe, Cigars  . Smokeless tobacco: Never Used  . Alcohol use No     Allergies   Sulfa antibiotics   Review of Systems Review of Systems  Constitutional: Positive for fatigue.  HENT: Positive for congestion, postnasal drip and sinus pressure.   Respiratory: Positive for cough, shortness of breath and wheezing.   All other systems reviewed and are negative.    Physical Exam Updated Vital Signs BP (!) 146/113 (BP Location: Right Arm)   Pulse (!) 103   Temp 97.7 F (36.5 C) (Oral)   Resp (!) 28   SpO2 92%   Physical Exam  Constitutional: He is oriented to person, place, and time. He appears well-developed and well-nourished. No distress.  HENT:  Head: Normocephalic  and atraumatic.  Nose: Nose normal. Right sinus exhibits no maxillary sinus tenderness and no frontal sinus tenderness. Left sinus exhibits no maxillary sinus tenderness and no frontal sinus tenderness.  Mouth/Throat: Uvula is midline, oropharynx is clear and moist and mucous membranes are normal. No oropharyngeal exudate, posterior oropharyngeal edema, posterior oropharyngeal erythema or tonsillar abscesses. No tonsillar exudate.  Eyes: Conjunctivae and EOM are normal. Right eye exhibits no discharge. Left eye exhibits no discharge. No scleral icterus.  Neck: Normal range of motion. Neck supple.  Cardiovascular: Normal rate, regular rhythm, normal heart sounds and intact distal pulses.   Pulmonary/Chest: Effort normal. No respiratory distress. He has wheezes. He has no rales. He exhibits no tenderness.  Diffuse expiratory wheezing throughout  Abdominal: Soft. Bowel sounds are normal. He exhibits no distension and no mass. There is no tenderness. There is no rebound and no guarding.  Soft, nontender,  midline hernia present which pt reports is chronic and unchanged  Musculoskeletal: Normal range of motion. He exhibits no edema.  Neurological: He is alert and oriented to person, place, and time.  Skin: Skin is warm and dry. He is not diaphoretic.  Nursing note and vitals reviewed.    ED Treatments / Results  Labs (all labs ordered are listed, but only abnormal results are displayed) Labs Reviewed  BASIC METABOLIC PANEL - Abnormal; Notable for the following:       Result Value   Chloride 98 (*)    BUN 36 (*)    Creatinine, Ser 1.29 (*)    GFR calc non Af Amer 47 (*)    GFR calc Af Amer 55 (*)    All other components within normal limits  CBC - Abnormal; Notable for the following:    WBC 13.8 (*)    All other components within normal limits    EKG  EKG Interpretation  Date/Time:  Monday Jul 20 2016 07:20:14 EDT Ventricular Rate:  95 PR Interval:  144 QRS Duration: 80 QT  Interval:  368 QTC Calculation: 462 R Axis:   30 Text Interpretation:  Sinus rhythm with marked sinus arrhythmia with frequent and consecutive Premature ventricular complexes Nonspecific ST and T wave abnormality Prolonged QT Confirmed by Ashok Cordia  MD, Lennette Bihari ([id number]) on 07/20/2016 8:38:24 AM       Radiology Dg Chest 2 View  Result Date: 07/20/2016 CLINICAL DATA:  Shortness of Breath EXAM: CHEST  2 VIEW COMPARISON:  07/17/2016 FINDINGS: There is hyperinflation of the lungs compatible with COPD. Heart is normal size. Bibasilar atelectasis or scarring. Previously seen right lower lobe infiltrate has improved. Calcified granulomas in the lungs bilaterally. No effusions or acute bony abnormality. IMPRESSION: COPD.  Bibasilar atelectasis or scarring. Electronically Signed   By: Rolm Baptise M.D.   On: 07/20/2016 09:02    Procedures Procedures (including critical care time)  Medications Ordered in ED Medications  albuterol (PROVENTIL) (2.5 MG/3ML) 0.083% nebulizer solution 5 mg (5 mg Nebulization Not Given 07/20/16 1046)  fluticasone (FLONASE) 50 MCG/ACT nasal spray 1 spray (1 spray Each Nare Given 07/20/16 1204)  ipratropium-albuterol (DUONEB) 0.5-2.5 (3) MG/3ML nebulizer solution 3 mL (3 mLs Nebulization Given 07/20/16 0845)     Initial Impression / Assessment and Plan / ED Course  I have reviewed the triage vital signs and the nursing notes.  Pertinent labs & imaging results that were available during my care of the patient were reviewed by me and considered in my medical decision making (see chart for details).    Patient presents with worsening shortness of breath, wheezing, nasal congestion and fatigue since being discharged home from [facility] last night. He reports he was admitted 2 days ago for pneumonia and was started on antibiotics and prednisone but states he has not been feeling better since he went home. D/c home on cefdinir. Patient with history of COPD, on 2 L O2 via nasal  cannula at baseline. VSS. Exam revealed diffuse expiratory wheezing throughout. Remaining exam unremarkable. Patient given DuoNeb treatment. Labs showed WBC 13.8. CXR showed COPD with bibasilar atelectasis. On reevaluation s/p multiple neb txs, pt reports unchanged sxs and is unable to ambulate in the ED due to his sxs of SOB and fatigue. Suspect sxs are likely related to COPD exacerbation. Discussed pt with Dr. Ashok Cordia who also evaluated pt in the ED. Discussed results and plan for admission with pt. Consulted hospitalist for admission, Dr. Marily Memos agrees to admission.  Final Clinical Impressions(s) / ED Diagnoses   Final diagnoses:  Shortness of breath  COPD exacerbation [facility])    New Prescriptions New Prescriptions   No medications on file     Nona Dell, Hershal Coria 07/20/16 1315    Lajean Saver, MD 07/20/16 1623

## 2016-07-20 NOTE — Progress Notes (Signed)
Trevor Pitts is a 83 y.o. male patient admitted from ED awake, alert - oriented  X 4 - no acute distress noted.  VSS - Blood pressure 122/80, pulse (!) 109, temperature 97.7 F (36.5 C), temperature source Oral, resp. rate (!) 27, SpO2 92 %.    IV in place, occlusive dsg intact without redness.  Orientation to room, and floor completed with information packet given to patient/family.  Patient declined safety video at this time.  Admission INP armband ID verified with patient/family, and in place.   SR up x 2, fall assessment complete, with patient and family able to verbalize understanding of risk associated with falls, and verbalized understanding to call nsg before up out of bed.  Call light within reach, patient able to voice, and demonstrate understanding.  Skin, clean-dry- intact without evidence of bruising, or skin tears.   No evidence of skin break down noted on exam.     Will cont to eval and treat per MD orders.  Luci Bank, RN 07/20/2016 2:51 PM

## 2016-07-20 NOTE — Progress Notes (Addendum)
Pharmacy Antibiotic Note  Trevor Pitts is a 83 y.o. male admitted on 07/20/2016 with pneumonia.  Pharmacy has been consulted for Levaquin dosing.  SCr slightly elevated at 1.29.   Plan: Give Levaquin 750mg  PO x 1, then start Levaquin 500mg  PO Q24h Monitor clinical picture, renal function F/U C&S, abx deescalation / LOT     Temp (24hrs), Avg:97.6 F (36.4 C), Min:97.4 F (36.3 C), Max:97.7 F (36.5 C)   Recent Labs Lab 07/20/16 0738  WBC 13.8*  CREATININE 1.29*    CrCl cannot be calculated (Unknown ideal weight.).    Allergies  Allergen Reactions  . Sulfa Antibiotics     Antimicrobials this admission: Levaquin 5/14 >>   Dose adjustments this admission: n/a  Microbiology results: 5/14 BCx: sent 5/14 UCx: sent  5/14 Sputum: sent  Thank you for allowing pharmacy to be a part of this patient's care.  Elenor Quinones, PharmD, BCPS Clinical Pharmacist Pager [phone] 07/20/2016 3:21 PM

## 2016-07-20 NOTE — ED Triage Notes (Signed)
Pt reports admitted in [place] for pnuemonia, dc yesterday w/steroids and abx but states he is not feeling any better, reports his head feels congested. Pt speaking in full sentences. Wears 2 L O2 regularly due to COPD. O2 sat 92% in triage which pt reports is baseline.

## 2016-07-20 NOTE — H&P (Signed)
History and Physical    Trevor Pitts PXT:[id number] DOB: [birth year] DOA: 07/20/2016  PCP: Merrilee Seashore, MD Patient coming from: home  Chief Complaint: SOB and weakness.   HPI: Trevor Pitts is a 83 y.o. male with medical history significant of intra-fibrillation, prostate cancer, CK-MB, hypertension, recurrent pneumonias, ventral hernia. Patient reports 4-5 day history of shortness of breath, cough with intermittent wheezing. Patient was seen at [facility] on 07/18/2016 and admitted overnight for treatment of community-acquired pneumonia. Patient reports improvement in overall symptoms and compliance with antibiotic after discharge along with steroids and breathing treatments. Patient states initially he felt better than over the course of the night prior to admission here at [facility] he became significantly more short of breath with worsening wheezing and coarse breath sounds. Patient denies chest pain, palpitations, nausea, vomiting, fevers, dysuria, frequency, LOC, vertigo, neck stiffness, headache.    ED Course: Objective findings outlined below. Improvement in rest her status after nebulizer treatments.  Review of Systems: As per HPI otherwise all other systems reviewed and are negative  Ambulatory Status: Restricted only secondary to baseline oxygen demand.  Past Medical History:  Diagnosis Date  . Asthma   . Atrial fibrillation ([place])   . Cancer [facility])    prostate - sp radiation w/o recurrence  . CKD (chronic kidney disease) stage 2, GFR 60-89 ml/min 07/20/2016  . COPD (chronic obstructive pulmonary disease) ([place])   . H/O hiatal hernia   . Hernia of abdominal cavity   . Hypertension   . Pneumonia   . Recurrent upper respiratory infection (URI)   . Shortness of breath     Past Surgical History:  Procedure Laterality Date  . COLON SURGERY      Social History   Social History  . Marital status: Widowed    Spouse name: N/A  . Number of  children: N/A  . Years of education: N/A   Occupational History  . Not on file.   Social History Main Topics  . Smoking status: Former Smoker    Packs/day: 0.50    Years: 25.00    Types: Cigarettes, Pipe, Cigars  . Smokeless tobacco: Never Used  . Alcohol use No  . Drug use: No  . Sexual activity: No   Other Topics Concern  . Not on file   Social History Narrative   ** Merged History Encounter **       Lives with wife          Allergies  Allergen Reactions  . Sulfa Antibiotics     Family History  Problem Relation Age of Onset  . Heart attack Father     Prior to Admission medications   Medication Sig Start Date End Date Taking? Authorizing Provider  ALPRAZolam (XANAX) 0.25 MG tablet Take 0.25 mg by mouth 3 (three) times daily.   Yes [provider]  arformoterol (BROVANA) 15 MCG/2ML NEBU Take 15 mcg by nebulization 2 (two) times daily.   Yes [provider]  aspirin 81 MG tablet Take 81 mg by mouth daily.    Yes [provider]  atorvastatin (LIPITOR) 80 MG tablet Take 40 mg by mouth every Monday, Wednesday, and Friday.    Yes [provider]  benazepril (LOTENSIN) 40 MG tablet Take 40 mg by mouth 2 (two) times daily.    Yes [provider]  budesonide (PULMICORT) 0.5 MG/2ML nebulizer solution Take 0.5 mg by nebulization 2 (two) times daily.   Yes [provider]  cefdinir (  OMNICEF) 300 MG capsule Take 300 mg by mouth 2 (two) times daily. 07/19/16 07/23/16 Yes [provider]  famotidine (PEPCID) 20 MG tablet Take 20 mg by mouth at bedtime.    Yes [provider]  metoprolol-hydrochlorothiazide (LOPRESSOR HCT) 50-25 MG tablet Take 1 tablet by mouth daily.   Yes [provider]  Multiple Vitamin (MULTIVITAMIN) capsule Take 1 capsule by mouth daily.   Yes [provider]  Multiple Vitamins-Minerals (ICAPS MV) TABS Take 1 tablet by mouth daily.    Yes [provider]    OXYGEN-HELIUM IN Pt uses o2 2lpm with exertion as needed- [employer]   Yes [provider]  predniSONE (DELTASONE) 20 MG tablet Take 40 mg by mouth daily for 3 days, then 20mg  by mouth daily for 3 days, then 10mg  daily for 3 days Patient taking differently: Take 5 mg by mouth daily.  02/01/16  Yes Larene Pickett, PA-C  Probiotic CAPS Take 1 capsule by mouth daily.   Yes [provider]  Tiotropium Bromide Monohydrate (SPIRIVA RESPIMAT IN) Inhale 2 puffs into the lungs daily.   Yes [provider]  albuterol (PROVENTIL) (2.5 MG/3ML) 0.083% nebulizer solution Take 3 mLs (2.5 mg total) by nebulization every 6 (six) hours as needed for wheezing or shortness of breath. Patient not taking: Reported on 07/20/2016 08/28/14   Tanda Rockers, MD  irbesartan (AVAPRO) 150 MG tablet Take 1 tablet (150 mg total) by mouth daily. Patient not taking: Reported on 07/20/2016 04/18/14   Tanda Rockers, MD  oxyCODONE-acetaminophen (PERCOCET/ROXICET) 5-325 MG tablet Take 1 tablet by mouth every 4 (four) hours as needed for severe pain. Patient not taking: Reported on 07/20/2016 06/30/15   Clayton Bibles, [place]    Physical Exam: Vitals:   07/20/16 1321 07/20/16 1330 07/20/16 1345 07/20/16 1459  BP: 124/62 (!) 144/81 122/80 (!) 133/93  Pulse: 100 (!) 101 (!) 109 (!) 103  Resp: (!) 27 (!) 25 (!) 27 18  Temp: 97.7 F (36.5 C)     TempSrc: Oral     SpO2: 96% 96% 92% 93%     General:  Appears mildly anxious but resting comfortably in bed.  Eyes:  PERRL, EOMI, normal lids, iris ENT:  grossly normal hearing, lips & tongue, mmm Neck:  no LAD, masses or thyromegaly Cardiovascular: Faint heart sounds, irregularly irregular, trace bilateral lower extremity pitting edema. Respiratory: Coarse breath sounds throughout, diminished bases, expiratory wheezing throughout, increased effort. Abdomen:  soft, ntnd, NABS Skin:  no rash or induration seen on limited exam Musculoskeletal:  grossly normal tone  BUE/BLE, good ROM, no bony abnormality Psychiatric:  grossly normal mood and affect, speech fluent and appropriate, AOx3 Neurologic:  CN 2-12 grossly intact, moves all extremities in coordinated fashion, sensation intact  Labs on Admission: I have personally reviewed following labs and imaging studies  CBC:  Recent Labs Lab 07/20/16 0738  WBC 13.8*  HGB 14.9  HCT 44.3  MCV 89.5  PLT 009   Basic Metabolic Panel:  Recent Labs Lab 07/20/16 0738  NA 138  K 3.6  CL 98*  CO2 28  GLUCOSE 81  BUN 36*  CREATININE 1.29*  CALCIUM 9.3   GFR: CrCl cannot be calculated (Unknown ideal weight.). Liver Function Tests: No results for input(s): AST, ALT, ALKPHOS, BILITOT, PROT, ALBUMIN in the last 168 hours. No results for input(s): LIPASE, AMYLASE in the last 168 hours. No results for input(s): AMMONIA in the last 168 hours. Coagulation Profile: No results for input(s):  INR, PROTIME in the last 168 hours. Cardiac Enzymes: No results for input(s): CKTOTAL, CKMB, CKMBINDEX, TROPONINI in the last 168 hours. BNP (last 3 results) No results for input(s): PROBNP in the last 8760 hours. HbA1C: No results for input(s): HGBA1C in the last 72 hours. CBG: No results for input(s): GLUCAP in the last 168 hours. Lipid Profile: No results for input(s): CHOL, HDL, LDLCALC, TRIG, CHOLHDL, LDLDIRECT in the last 72 hours. Thyroid Function Tests: No results for input(s): TSH, T4TOTAL, FREET4, T3FREE, THYROIDAB in the last 72 hours. Anemia Panel: No results for input(s): VITAMINB12, FOLATE, FERRITIN, TIBC, IRON, RETICCTPCT in the last 72 hours. Urine analysis:    Component Value Date/Time   COLORURINE YELLOW 06/30/2015 [place] 06/30/2015 1227   LABSPEC 1.020 06/30/2015 1227   LABSPEC 1.020 10/16/2010 1539   PHURINE 6.0 06/30/2015 1227   GLUCOSEU NEGATIVE 06/30/2015 1227   GLUCOSEU NEGATIVE 12/14/2013 1224   HGBUR NEGATIVE 06/30/2015 1227   BILIRUBINUR NEGATIVE 06/30/2015  1227   BILIRUBINUR Negative 10/16/2010 1539   KETONESUR NEGATIVE 06/30/2015 1227   PROTEINUR NEGATIVE 06/30/2015 1227   UROBILINOGEN 0.2 12/14/2013 1224   NITRITE NEGATIVE 06/30/2015 1227   LEUKOCYTESUR NEGATIVE 06/30/2015 1227   LEUKOCYTESUR Trace 10/16/2010 1539    Creatinine Clearance: CrCl cannot be calculated (Unknown ideal weight.).  Sepsis Labs: @LABRCNTIP (procalcitonin:4,lacticidven:4) )No results found for this or any previous visit (from the past 240 hour(s)).   Radiological Exams on Admission: Dg Chest 2 View  Result Date: 07/20/2016 CLINICAL DATA:  Shortness of Breath EXAM: CHEST  2 VIEW COMPARISON:  07/17/2016 FINDINGS: There is hyperinflation of the lungs compatible with COPD. Heart is normal size. Bibasilar atelectasis or scarring. Previously seen right lower lobe infiltrate has improved. Calcified granulomas in the lungs bilaterally. No effusions or acute bony abnormality. IMPRESSION: COPD.  Bibasilar atelectasis or scarring. Electronically Signed   By: Rolm Baptise M.D.   On: 07/20/2016 09:02    EKG: Independently reviewed. Sinus, multiple PVCs.   Assessment/Plan Active Problems:   HLD (hyperlipidemia)   COPD exacerbation (HCC)   CAP (community acquired pneumonia)   Chronic respiratory failure with hypoxia (HCC)   Respiratory distress   PAF (paroxysmal atrial fibrillation) (HCC)   Nonsustained paroxysmal supraventricular tachycardia (HCC)   CKD (chronic kidney disease) stage 2, GFR 60-89 ml/min    Respiratory distress: likely secondary to COPD exacerbation and possible CAP. CXR unremarkable. WBC >13 w/ significant increased effort especially w/ minimal exertion. Sx worsening subjectively per pt after DC from [facility] 24hrs ago. No increased O2 over baseline of 2L - Procalcitonin, respiratory viral panel - Prednisone 60 Qday w/ Solumedrol 80 x1 - Duoneb Q4, Albuterol Q2prn - Levaquin IV (hold home omnicef) - continue spiriva, brovana,  pulmicort  PAFib/PVCs and non-sustained Vtach: h/o frequent PVCs. In and out of sinus/Afib. Now w/ several long runs of Vtach likely from albuterol, cardiac strain from CAP, and questionable compliance w/ home regimen since hospitalization over past 1-2 days.  - Dr. Einar Gip consulted - Continue bblocker   Generalized weakness: pt has deconditioned quickly since becoming ill. Concerned for ability to care for self at home. May do well w/ home health - CSW, PT, OT, social work, Case mgt  CKD: Cr 1.29. Near baseline based on previous results - BMET in am  HTN: - continue lotensin, metop  CAD: - continue ASA, Statin  GERD: - continue pepcid  Anxiety: - continue xanax    DVT prophylaxis: Lovenox3  Code Status: full  Family  Communication: none  Disposition Plan: pending improvement   Consults called: Dr. Einar Gip  Admission status: observation    Cayce Paschal J MD [facility]  If 7PM-7AM, please contact night-coverage [email or web address] Password [facility]  07/20/2016, 3:18 PM

## 2016-07-20 NOTE — ED Notes (Signed)
Pt states does not want to go home-- states he normally is active and able to take care of himself, since discharge from [facility]-- still feels weak.

## 2016-07-20 NOTE — Evaluation (Signed)
Physical Therapy Evaluation Patient Details Name: Trevor Pitts MRN: [id number] DOB: [birth year] Today's Date: 07/20/2016   History of Present Illness   Trevor Pitts is a 83 y.o. male with medical history significant of intra-fibrillation, prostate cancer, CK-MB, hypertension, recurrent pneumonias, ventral hernia. Patient reports 4-5 day history of shortness of breath, cough with intermittent wheezing.  Clinical Impression  Pt indep and living along PTA. Pt activity limited by SOB and dec SPO2 into 80s with amb while on 2Lo2 via [place]. Pt to benefit from ST-SNF upon d/c to achieve safe mod I level of function.    Follow Up Recommendations SNF;Supervision/Assistance - 24 hour  (unless pt progresses to safe mod I level)   Equipment Recommendations  None recommended by PT    Recommendations for Other Services       Precautions / Restrictions Precautions Precautions: Fall Precaution Comments: SOB with mobility, watch SPO2 Restrictions Weight Bearing Restrictions: No      Mobility  Bed Mobility Overal bed mobility: Modified Independent             General bed mobility comments: incresaed time, HOB elevated but no physical assist  Transfers Overall transfer level: Needs assistance Equipment used: None Transfers: Sit to/from Stand Sit to Stand: Supervision         General transfer comment: guarded, slow, pushed off eob and reached for hand rail to steady self  Ambulation/Gait Ambulation/Gait assistance: Min assist Ambulation Distance (Feet): 30 Feet Assistive device: Rolling walker (2 wheeled);None Gait Pattern/deviations: Step-through pattern;Decreased stride length Gait velocity: slow Gait velocity interpretation: Below normal speed for age/gender General Gait Details: attempted to amb without AD as in PTA however pt unsteady and reaching for PT. pt given RW, pt with increased stabiltiy however 2/4 DOE and SPO2 dec to 88% on 2Lo2  Stairs             Wheelchair Mobility    Modified Rankin (Stroke Patients Only)       Balance Overall balance assessment: Needs assistance Sitting-balance support: Feet supported Sitting balance-Leahy Scale: Good Sitting balance - Comments: pt able to don socks sitting EOB without difficulty   Standing balance support: During functional activity Standing balance-Leahy Scale: Fair Standing balance comment: requires RW for safe amb                             Pertinent Vitals/Pain Pain Assessment: No/denies pain    Home Living Family/patient expects to be discharged to:: Private residence Living Arrangements: Alone Available Help at Discharge: Family;Available PRN/intermittently Type of Home: House Home Access: Level entry     Home Layout: One level Home Equipment: Walker - 2 wheels;Shower seat - built in Additional Comments: pt reports has a son who lives next door but he works. sons can do grocery shopping, mow yard    Prior Function Level of Independence: Independent         Comments: was driving and mowing yard     Hand Dominance   Dominant Hand: Right    Extremity/Trunk Assessment   Upper Extremity Assessment Upper Extremity Assessment: Overall WFL for tasks assessed    Lower Extremity Assessment Lower Extremity Assessment: Generalized weakness (grossly 4/5)    Cervical / Trunk Assessment Cervical / Trunk Assessment: Normal  Communication   Communication: No difficulties  Cognition Arousal/Alertness: Awake/alert Behavior During Therapy: WFL for tasks assessed/performed Overall Cognitive Status: Within Functional Limits for tasks assessed  General Comments      Exercises     Assessment/Plan    PT Assessment Patient needs continued PT services  PT Problem List Decreased strength;Decreased activity tolerance;Decreased balance;Decreased mobility;Decreased knowledge of use of  DME;Cardiopulmonary status limiting activity       PT Treatment Interventions DME instruction;Gait training;Stair training;Functional mobility training;Therapeutic activities;Therapeutic exercise    PT Goals (Current goals can be found in the Care Plan section)  Acute Rehab PT Goals Patient Stated Goal: improve breathing Time For Goal Achievement: 07/27/16 Potential to Achieve Goals: Good    Frequency Min 3X/week   Barriers to discharge Decreased caregiver support lives alone    Co-evaluation               AM-PAC PT "6 Clicks" Daily Activity  Outcome Measure Difficulty turning over in bed (including adjusting bedclothes, sheets and blankets)?: Total Difficulty moving from lying on back to sitting on the side of the bed? : Total Difficulty sitting down on and standing up from a chair with arms (e.g., wheelchair, bedside commode, etc,.)?: A Little Help needed moving to and from a bed to chair (including a wheelchair)?: A Little Help needed walking in hospital room?: A Little Help needed climbing 3-5 steps with a railing? : A Little 6 Click Score: 14    End of Session Equipment Utilized During Treatment: Gait belt;Oxygen (2Lo2 via [place]) Activity Tolerance:  (limited by cardiopulmonary function) Patient left: in chair;with call bell/phone within reach (with respiratory therapist present) Nurse Communication: Mobility status PT Visit Diagnosis: Unsteadiness on feet (R26.81);Difficulty in walking, not elsewhere classified (R26.2);Muscle weakness (generalized) (M62.81)    Time: 6144-3154 PT Time Calculation (min) (ACUTE ONLY): 23 min   Charges:   PT Evaluation $PT Eval Low Complexity: 1 Procedure PT Treatments $Gait Training: 8-22 mins   PT G Codes:   PT G-Codes **NOT FOR INPATIENT CLASS** Functional Assessment Tool Used: Clinical judgement Functional Limitation: Mobility: Walking and moving around Mobility: Walking and Moving Around Current Status (M0867): At least 20  percent but less than 40 percent impaired, limited or restricted Mobility: Walking and Moving Around Goal Status [phone]): At least 1 percent but less than 20 percent impaired, limited or restricted    Kittie Plater, PT, DPT Pager #: [phone] Office #: [phone]   [place] 07/20/2016, 4:41 PM

## 2016-07-21 DIAGNOSIS — I471 Supraventricular tachycardia: Secondary | ICD-10-CM | POA: Diagnosis not present

## 2016-07-21 DIAGNOSIS — Z8546 Personal history of malignant neoplasm of prostate: Secondary | ICD-10-CM | POA: Diagnosis not present

## 2016-07-21 DIAGNOSIS — I480 Paroxysmal atrial fibrillation: Secondary | ICD-10-CM | POA: Diagnosis not present

## 2016-07-21 DIAGNOSIS — J9611 Chronic respiratory failure with hypoxia: Secondary | ICD-10-CM | POA: Diagnosis not present

## 2016-07-21 DIAGNOSIS — Z7982 Long term (current) use of aspirin: Secondary | ICD-10-CM | POA: Diagnosis not present

## 2016-07-21 DIAGNOSIS — I472 Ventricular tachycardia: Secondary | ICD-10-CM | POA: Diagnosis present

## 2016-07-21 DIAGNOSIS — J441 Chronic obstructive pulmonary disease with (acute) exacerbation: Secondary | ICD-10-CM | POA: Diagnosis not present

## 2016-07-21 DIAGNOSIS — Z8249 Family history of ischemic heart disease and other diseases of the circulatory system: Secondary | ICD-10-CM | POA: Diagnosis not present

## 2016-07-21 DIAGNOSIS — Z87891 Personal history of nicotine dependence: Secondary | ICD-10-CM | POA: Diagnosis not present

## 2016-07-21 DIAGNOSIS — Z79899 Other long term (current) drug therapy: Secondary | ICD-10-CM | POA: Diagnosis not present

## 2016-07-21 DIAGNOSIS — Z9981 Dependence on supplemental oxygen: Secondary | ICD-10-CM | POA: Diagnosis not present

## 2016-07-21 DIAGNOSIS — Z8701 Personal history of pneumonia (recurrent): Secondary | ICD-10-CM | POA: Diagnosis not present

## 2016-07-21 DIAGNOSIS — I10 Essential (primary) hypertension: Secondary | ICD-10-CM | POA: Diagnosis not present

## 2016-07-21 DIAGNOSIS — N182 Chronic kidney disease, stage 2 (mild): Secondary | ICD-10-CM | POA: Diagnosis not present

## 2016-07-21 DIAGNOSIS — N183 Chronic kidney disease, stage 3 (moderate): Secondary | ICD-10-CM | POA: Diagnosis present

## 2016-07-21 DIAGNOSIS — Z882 Allergy status to sulfonamides status: Secondary | ICD-10-CM | POA: Diagnosis not present

## 2016-07-21 DIAGNOSIS — I2510 Atherosclerotic heart disease of native coronary artery without angina pectoris: Secondary | ICD-10-CM | POA: Diagnosis present

## 2016-07-21 DIAGNOSIS — Z7951 Long term (current) use of inhaled steroids: Secondary | ICD-10-CM | POA: Diagnosis not present

## 2016-07-21 DIAGNOSIS — J189 Pneumonia, unspecified organism: Secondary | ICD-10-CM | POA: Diagnosis not present

## 2016-07-21 DIAGNOSIS — R0603 Acute respiratory distress: Secondary | ICD-10-CM | POA: Diagnosis not present

## 2016-07-21 DIAGNOSIS — K219 Gastro-esophageal reflux disease without esophagitis: Secondary | ICD-10-CM | POA: Diagnosis not present

## 2016-07-21 DIAGNOSIS — I129 Hypertensive chronic kidney disease with stage 1 through stage 4 chronic kidney disease, or unspecified chronic kidney disease: Secondary | ICD-10-CM | POA: Diagnosis present

## 2016-07-21 DIAGNOSIS — R0602 Shortness of breath: Secondary | ICD-10-CM | POA: Diagnosis not present

## 2016-07-21 DIAGNOSIS — F419 Anxiety disorder, unspecified: Secondary | ICD-10-CM | POA: Diagnosis present

## 2016-07-21 DIAGNOSIS — E784 Other hyperlipidemia: Secondary | ICD-10-CM | POA: Diagnosis not present

## 2016-07-21 DIAGNOSIS — J440 Chronic obstructive pulmonary disease with acute lower respiratory infection: Secondary | ICD-10-CM | POA: Diagnosis present

## 2016-07-21 DIAGNOSIS — E785 Hyperlipidemia, unspecified: Secondary | ICD-10-CM | POA: Diagnosis present

## 2016-07-21 DIAGNOSIS — I493 Ventricular premature depolarization: Secondary | ICD-10-CM | POA: Diagnosis present

## 2016-07-21 LAB — CBC
HEMATOCRIT: 41.7 % (ref 39.0–52.0)
HEMOGLOBIN: 14.3 g/dL (ref 13.0–17.0)
MCH: 30.5 pg (ref 26.0–34.0)
MCHC: 34.3 g/dL (ref 30.0–36.0)
MCV: 88.9 fL (ref 78.0–100.0)
PLATELETS: 218 10*3/uL (ref 150–400)
RBC: 4.69 MIL/uL (ref 4.22–5.81)
RDW: 15.2 % (ref 11.5–15.5)
WBC: 9.8 10*3/uL (ref 4.0–10.5)

## 2016-07-21 LAB — BASIC METABOLIC PANEL
ANION GAP: 12 (ref 5–15)
BUN: 40 mg/dL — AB (ref 6–20)
CO2: 25 mmol/L (ref 22–32)
Calcium: 8.7 mg/dL — ABNORMAL LOW (ref 8.9–10.3)
Chloride: 101 mmol/L (ref 101–111)
Creatinine, Ser: 1.2 mg/dL (ref 0.61–1.24)
GFR calc Af Amer: 60 mL/min — ABNORMAL LOW (ref >60)
GFR calc non Af Amer: 52 mL/min — ABNORMAL LOW (ref >60)
GLUCOSE: 93 mg/dL (ref 65–99)
Potassium: 4.3 mmol/L (ref 3.5–5.1)
Sodium: 138 mmol/L (ref 135–145)

## 2016-07-21 LAB — STREP PNEUMONIAE URINARY ANTIGEN: Strep Pneumo Urinary Antigen: NEGATIVE

## 2016-07-21 LAB — HIV ANTIBODY (ROUTINE TESTING W REFLEX): HIV Screen 4th Generation wRfx: NONREACTIVE

## 2016-07-21 MED ORDER — BISOPROLOL FUMARATE 10 MG PO TABS
10.0000 mg | ORAL_TABLET | Freq: Every day | ORAL | Status: DC
  Administered 2016-07-21 – 2016-07-22 (×2): 10 mg via ORAL
  Filled 2016-07-21 (×2): qty 1

## 2016-07-21 MED ORDER — ORAL CARE MOUTH RINSE
15.0000 mL | Freq: Two times a day (BID) | OROMUCOSAL | Status: DC
  Administered 2016-07-21 – 2016-07-22 (×3): 15 mL via OROMUCOSAL

## 2016-07-21 MED ORDER — METHYLPREDNISOLONE SODIUM SUCC 40 MG IJ SOLR
40.0000 mg | Freq: Four times a day (QID) | INTRAMUSCULAR | Status: DC
  Administered 2016-07-21 – 2016-07-22 (×5): 40 mg via INTRAVENOUS
  Filled 2016-07-21 (×5): qty 1

## 2016-07-21 NOTE — Care Management Obs Status (Signed)
[place] NOTIFICATION   Patient Details  Name: Trevor Pitts MRN: [id number] Date of Birth: [birth year]   Medicare Observation Status Notification Given:  Yes    Sharin Mons, RN 07/21/2016, 11:10 AM

## 2016-07-21 NOTE — Progress Notes (Signed)
PROGRESS NOTE  Trevor Pitts  GGY:[id number]  DOB: 01/10/1925  DOA: 07/20/2016 PCP: Merrilee Seashore, MD Outpatient Specialists:  Admission Hx: is a 83 y.o. male with medical history significant of atrial fibrillation, prostate cancer, CK-MB, hypertension, recurrent pneumonias, ventral hernia. Patient reports 4-5 day history of shortness of breath, cough with intermittent wheezing. Patient was seen at [facility] on 07/18/2016 and admitted overnight for treatment of community-acquired pneumonia. Patient reports improvement in overall symptoms and compliance with antibiotic after discharge along with steroids and breathing treatments. Patient states initially he felt better than over the course of the night prior to admission here at [facility] he became significantly more short of breath with worsening wheezing and coarse breath sounds.   Assessment & Plan:   1. Acute respiratory distress / Acute COPD exacerbation - likely exacerbated by recent pneumonia - Pt still significantly short of breath and unable to ambulate due to shortness of breath and wheezing, will need to continue hospital care with IV steroids, scheduled nebs, antibiotics, oxygen support. Pt lives alone and medically not stable for discharge home today.  Will ask for PT/OT evaluation as he may need home health or rehab.  2. Atrial tachycardia and non-sustained Vtach - appreciate cardiology consult by Dr. Einar Gip - some medication adjustments made to beta blocker.  He was changed to bisoprolol.  No further cardiac workup recommended.   3. CKD - renal function improved with hydration, cut back on IVF today as he is eating and drinking better.   4. Generalized weakness - multifactorial - get PT/OT eval.  5. CAD - on aspirin and statin - stable.  6. GAD - continue alprazolam.   DVT prophylaxis: lovenox Code Status: full  Family Communication: none at bedside Disposition Plan: not medically stable today, Home with  home health services in 1-2 days  Subjective: Pt reports that he is still very SOB with minimal ambulation.  He lives alone at home.  He has unsteady gait.    Objective: Vitals:   07/20/16 1657 07/20/16 2059 07/20/16 2122 07/21/16 0608  BP: 128/72  134/79 (!) 147/86  Pulse:   73 78  Resp:   18 18  Temp:   98.1 F (36.7 C) 97.8 F (36.6 C)  TempSrc:   Oral   SpO2:  96% 96% 96%    Intake/Output Summary (Last 24 hours) at 07/21/16 1239 Last data filed at 07/21/16 1100  Gross per 24 hour  Intake             1415 ml  Output             1090 ml  Net              325 ml   There were no vitals filed for this visit.  Exam:  General exam: elderly male, chronically ill appearing.   Respiratory system: expiratory wheezes R>L, no crackles heard. Cardiovascular system: S1 & S2 heard. Gastrointestinal system: Abdomen is nondistended, soft and nontender. Normal bowel sounds heard. Central nervous system: Alert and oriented. No focal neurological deficits. Extremities: no cyanosis.  Data Reviewed: Basic Metabolic Panel:  Recent Labs Lab 07/20/16 0738 07/21/16 0716  NA 138 138  K 3.6 4.3  CL 98* 101  CO2 28 25  GLUCOSE 81 93  BUN 36* 40*  CREATININE 1.29* 1.20  CALCIUM 9.3 8.7*   Liver Function Tests: No results for input(s): AST, ALT, ALKPHOS, BILITOT, PROT, ALBUMIN in the last 168 hours. No results for input(s): LIPASE,  AMYLASE in the last 168 hours. No results for input(s): AMMONIA in the last 168 hours. CBC:  Recent Labs Lab 07/20/16 0738 07/21/16 0716  WBC 13.8* 9.8  HGB 14.9 14.3  HCT 44.3 41.7  MCV 89.5 88.9  PLT 261 218   Cardiac Enzymes: No results for input(s): CKTOTAL, CKMB, CKMBINDEX, TROPONINI in the last 168 hours. CBG (last 3)  No results for input(s): GLUCAP in the last 72 hours. Recent Results (from the past 240 hour(s))  Culture, blood (routine x 2) Call MD if unable to obtain prior to antibiotics being given     Status: None (Preliminary  result)   Collection Time: 07/20/16  3:30 PM  Result Value Ref Range Status   Specimen Description BLOOD LEFT ANTECUBITAL  Final   Special Requests IN PEDIATRIC BOTTLE Blood Culture adequate volume  Final   Culture NO GROWTH < 24 HOURS  Final   Report Status PENDING  Incomplete  Culture, blood (routine x 2) Call MD if unable to obtain prior to antibiotics being given     Status: None (Preliminary result)   Collection Time: 07/20/16  3:33 PM  Result Value Ref Range Status   Specimen Description BLOOD RIGHT ANTECUBITAL  Final   Special Requests IN PEDIATRIC BOTTLE Blood Culture adequate volume  Final   Culture NO GROWTH < 24 HOURS  Final   Report Status PENDING  Incomplete     Studies: Dg Chest 2 View  Result Date: 07/20/2016 CLINICAL DATA:  Shortness of Breath EXAM: CHEST  2 VIEW COMPARISON:  07/17/2016 FINDINGS: There is hyperinflation of the lungs compatible with COPD. Heart is normal size. Bibasilar atelectasis or scarring. Previously seen right lower lobe infiltrate has improved. Calcified granulomas in the lungs bilaterally. No effusions or acute bony abnormality. IMPRESSION: COPD.  Bibasilar atelectasis or scarring. Electronically Signed   By: Rolm Baptise M.D.   On: 07/20/2016 09:02   Scheduled Meds: . ALPRAZolam  0.25 mg Oral TID  . arformoterol  15 mcg Nebulization BID  . aspirin EC  81 mg Oral Daily  . atorvastatin  40 mg Oral Q M,W,F  . benazepril  40 mg Oral BID  . bisoprolol  10 mg Oral Daily  . budesonide  0.5 mg Nebulization BID  . enoxaparin (LOVENOX) injection  40 mg Subcutaneous Q24H  . famotidine  20 mg Oral QHS  . fluticasone  1 spray Each Nare Daily  . hydrochlorothiazide  25 mg Oral Daily  . ipratropium-albuterol  3 mL Nebulization QID  . levofloxacin  500 mg Oral Daily  . mouth rinse  15 mL Mouth Rinse BID  . methylPREDNISolone (SOLU-MEDROL) injection  40 mg Intravenous Q6H   Continuous Infusions: . sodium chloride 75 mL/hr at 07/21/16 8938    Active  Problems:   HLD (hyperlipidemia)   COPD exacerbation (HCC)   CAP (community acquired pneumonia)   Chronic respiratory failure with hypoxia (HCC)   Respiratory distress   PAF (paroxysmal atrial fibrillation) (HCC)   Nonsustained paroxysmal supraventricular tachycardia (HCC)   CKD (chronic kidney disease) stage 2, GFR 60-89 ml/min   Acute exacerbation of chronic obstructive pulmonary disease (COPD) ([place])  Time spent:   Irwin Brakeman, MD, FAAFP [facility] Pager [phone]  If 7PM-7AM, please contact night-coverage [email or web address] Password [email or web address] 07/21/2016, 12:39 PM    LOS: 0 days

## 2016-07-21 NOTE — Care Management Note (Addendum)
Case Management Note  Patient Details  Name: Trevor Pitts MRN: [id number] Date of Birth: [birth year]  Subjective/Objective:                   Presents with respiratory distress, history of intra-fibrillation, prostate cancer, CK-MB, hypertension, recurrent pneumonias, ventral hernia. Resides alone. States has son who lives close by and is supportive. States independent with ADL's PTA.  Pt states has home oxygen which is supplied from the [place].  Trevor Pitts (Son) Inez Catalina Renegar [phone]          Action/Plan:  Plan is to d/c to home when medically stable with home health services. DIL to bring portable oxygen for transportation to home @ d/c and provide mode of transportation to home.  Expected Discharge Date:    07/23/2015           Expected Discharge Plan:  [place]  In-House Referral:     Discharge planning Services  CM Consult  Post Acute Care Choice:  Resumption of Svcs/PTA Provider Choice offered to:  Patient  DME Arranged:    DME Agency:     HH Arranged:  PT, OT, RN, Social Work [employer] Agency:  [place], referral made with Butch Penny [phone]  Status of Service:  Completed, signed off  If discussed at [facility] [place] of Stay Meetings, dates discussed:    Additional Comments:  Sharin Mons, RN 07/21/2016, 2:14 PM

## 2016-07-21 NOTE — Progress Notes (Addendum)
Physical Therapy Treatment Patient Details Name: Trevor Pitts MRN: [id number] DOB: 12/21/1924 Today's Date: 07/21/2016    History of Present Illness  Trevor Pitts is a 83 y.o. male with medical history significant of intra-fibrillation, prostate cancer, CK-MB, hypertension, recurrent pneumonias, ventral hernia. Patient reports 4-5 day history of shortness of breath, cough with intermittent wheezing.    PT Comments    Pt ambulated 150' with RW, SaO2 93-97% on 2L O2. He tolerated increased overall gait distance today with improve O2 saturation levels. Performed seated BUE/LE exercises. Progressing well with mobility. Pt is now mobilizing well enough to DC home.    Follow Up Recommendations  HHPT; supervision for mobility     Equipment Recommendations  None recommended by PT    Recommendations for Other Services       Precautions / Restrictions Precautions Precautions: Fall Precaution Comments: SOB with mobility, watch SPO2 Restrictions Weight Bearing Restrictions: No    Mobility  Bed Mobility Overal bed mobility: Modified Independent                Transfers Overall transfer level: Needs assistance Equipment used: None Transfers: Sit to/from Stand Sit to Stand: Supervision         General transfer comment: up in recliner  Ambulation/Gait Ambulation/Gait assistance: Supervision Ambulation Distance (Feet): 150 Feet Assistive device: Rolling walker (2 wheeled) Gait Pattern/deviations: Step-through pattern;Decreased stride length   Gait velocity interpretation: at or above normal speed for age/gender General Gait Details: SaO2 93-97% on 2L O2 walking (he's on 2L at home), no loss of balance, good pursed lip breathing technique, 2/4 dyspnea with walking   Stairs            Wheelchair Mobility    Modified Rankin (Stroke Patients Only)       Balance     Sitting balance-Leahy Scale: Good       Standing balance-Leahy Scale: Fair                               Cognition Arousal/Alertness: Awake/alert Behavior During Therapy: WFL for tasks assessed/performed Overall Cognitive Status: Within Functional Limits for tasks assessed                                        Exercises General Exercises - Lower Extremity Ankle Circles/Pumps: AROM;Both;10 reps;Seated Long Arc Quad: AROM;Both;Seated;10 reps Hip Flexion/Marching: AROM;Both;10 reps;Seated Shoulder Exercises Shoulder Flexion: AROM;Both;10 reps;Seated    General Comments        Pertinent Vitals/Pain Pain Assessment: No/denies pain    Home Living Family/patient expects to be discharged to:: Skilled nursing facility Living Arrangements: Alone Available Help at Discharge: Family;Available PRN/intermittently Type of Home: House Home Access: Level entry   Home Layout: One level Home Equipment: Walker - 2 wheels;Shower seat - built in Additional Comments: pt reports has a son who lives next door but he works. sons can do grocery shopping, mow yard    Prior Function Level of Independence: Independent      Comments: was driving and mowing yard   PT Goals (current goals can now be found in the care plan section) Acute Rehab PT Goals Patient Stated Goal: improve breathing Time For Goal Achievement: 07/27/16 Potential to Achieve Goals: Good Progress towards PT goals: Progressing toward goals    Frequency    Min 3X/week  PT Plan Current plan remains appropriate    Co-evaluation              AM-PAC PT "6 Clicks" Daily Activity  Outcome Measure  Difficulty turning over in bed (including adjusting bedclothes, sheets and blankets)?: Total Difficulty moving from lying on back to sitting on the side of the bed? : Total Difficulty sitting down on and standing up from a chair with arms (e.g., wheelchair, bedside commode, etc,.)?: A Little Help needed moving to and from a bed to chair (including a wheelchair)?: A  Little Help needed walking in hospital room?: A Little Help needed climbing 3-5 steps with a railing? : A Little 6 Click Score: 14    End of Session Equipment Utilized During Treatment: Gait belt;Oxygen (2L O2 via [place]) Activity Tolerance: Patient tolerated treatment well (limited by cardiopulmonary function) Patient left: in chair;with call bell/phone within reach (with respiratory therapist present) Nurse Communication: Mobility status PT Visit Diagnosis: Unsteadiness on feet (R26.81);Difficulty in walking, not elsewhere classified (R26.2);Muscle weakness (generalized) (M62.81)     Time: 3491-7915 PT Time Calculation (min) (ACUTE ONLY): 24 min  Charges:  $Gait Training: 8-22 mins $Therapeutic Exercise: 8-22 mins                    G Codes:          Philomena Doheny 07/21/2016, 12:29 PM [phone]

## 2016-07-21 NOTE — Evaluation (Signed)
Occupational Therapy Evaluation Patient Details Name: Trevor Pitts MRN: [id number] DOB: Feb 10, 1925 Today's Date: 07/21/2016    History of Present Illness  Trevor Pitts is a 83 y.o. male with medical history significant of intra-fibrillation, prostate cancer, CK-MB, hypertension, recurrent pneumonias, ventral hernia. Patient reports 4-5 day history of shortness of breath, cough with intermittent wheezing.   Clinical Impression   PT admitted with SOB with cough. Pt currently with functional limitiations due to the deficits listed below (see OT problem list). PTA was independent with all adls, drives and goes to grocery store.  Pt will benefit from skilled OT to increase their independence and safety with adls and balance to allow discharge SNF.     Follow Up Recommendations  SNF    Equipment Recommendations  Other (comment) (defer to SNF )    Recommendations for Other Services       Precautions / Restrictions Precautions Precautions: Fall Precaution Comments: SOB with mobility, watch SPO2      Mobility Bed Mobility Overal bed mobility: Modified Independent                Transfers Overall transfer level: Needs assistance Equipment used: None Transfers: Sit to/from Stand Sit to Stand: Supervision         General transfer comment: guarded    Balance     Sitting balance-Leahy Scale: Good       Standing balance-Leahy Scale: Fair                             ADL either performed or assessed with clinical judgement   ADL Overall ADL's : Needs assistance/impaired Eating/Feeding: Independent   Grooming: Wash/dry hands;Wash/dry face;Independent;Sitting   Upper Body Bathing: Min guard;Sitting   Lower Body Bathing: Minimal assistance;Sit to/from stand Lower Body Bathing Details (indicate cue type and reason): educated on avoiding bending and cross bil LE and pt is able to complete                     Functional mobility during  ADLs: Min guard General ADL Comments: pt educated in depth about energy conservation. pt has son adn daughter in law to (A). pt currently without glasses so Ot had to read through out handout with teach back     Vision Baseline Vision/History: Wears glasses Wears Glasses: Reading only       Perception     Praxis      Pertinent Vitals/Pain Pain Assessment: No/denies pain     Hand Dominance Right   Extremity/Trunk Assessment Upper Extremity Assessment Upper Extremity Assessment: LUE deficits/detail LUE Deficits / Details: lacks full range but able to reach 100 degree    Lower Extremity Assessment Lower Extremity Assessment: Defer to PT evaluation   Cervical / Trunk Assessment Cervical / Trunk Assessment: Normal   Communication Communication Communication: No difficulties   Cognition Arousal/Alertness: Awake/alert Behavior During Therapy: WFL for tasks assessed/performed Overall Cognitive Status: Within Functional Limits for tasks assessed                                     General Comments       Exercises     Shoulder Instructions      Home Living Family/patient expects to be discharged to:: Skilled nursing facility Living Arrangements: Alone Available Help at Discharge: Family;Available PRN/intermittently Type of Home: House Home Access: Level  entry     Home Layout: One level     Bathroom Shower/Tub: Occupational psychologist: [place]: Environmental consultant - 2 wheels;Shower seat - built in   Additional Comments: pt reports has a son who lives next door but he works. sons can do grocery shopping, mow yard      Prior Functioning/Environment Level of Independence: Independent        Comments: was driving and mowing yard        OT Problem List: Decreased strength;Decreased activity tolerance;Impaired balance (sitting and/or standing);Decreased knowledge of use of DME or AE;Decreased knowledge of  precautions;Cardiopulmonary status limiting activity      OT Treatment/Interventions: Self-care/ADL training;Energy conservation;Therapeutic exercise;Therapeutic activities;Patient/family education;Balance training    OT Goals(Current goals can be found in the care plan section) Acute Rehab OT Goals Patient Stated Goal: improve breathing OT Goal Formulation: With patient Time For Goal Achievement: 08/04/16 Potential to Achieve Goals: Good  OT Frequency: Min 2X/week   Barriers to D/C: Decreased caregiver support (PRn support)          Co-evaluation              AM-PAC PT "6 Clicks" Daily Activity     Outcome Measure Help from another person eating meals?: None Help from another person taking care of personal grooming?: None Help from another person toileting, which includes using toliet, bedpan, or urinal?: None Help from another person bathing (including washing, rinsing, drying)?: A Little Help from another person to put on and taking off regular upper body clothing?: None Help from another person to put on and taking off regular lower body clothing?: A Little 6 Click Score: 22   End of Session Nurse Communication: Mobility status;Precautions  Activity Tolerance: Patient tolerated treatment well Patient left: in chair;with call bell/phone within reach  OT Visit Diagnosis: Unsteadiness on feet (R26.81)                Time: 2263-3354 OT Time Calculation (min): 16 min Charges:  OT General Charges $OT Visit: 1 Procedure OT Evaluation $OT Eval Moderate Complexity: 1 Procedure G-Codes: OT G-codes **NOT FOR INPATIENT CLASS** Functional Assessment Tool Used: Clinical judgement Functional Limitation: Self care Self Care Current Status (T6256): At least 40 percent but less than 60 percent impaired, limited or restricted Self Care Goal Status (L8937): At least 1 percent but less than 20 percent impaired, limited or restricted    Jeri Modena   OTR/L Pager:  [phone] Office: [phone] .   Parke Poisson B 07/21/2016, 11:37 AM

## 2016-07-21 NOTE — Progress Notes (Signed)
CSW acknowledges SNF consult. However, patient does not meet Medicare requirements and would have to pay privately for SNF.  Percell Locus Romelle Reiley LCSWA [phone]

## 2016-07-21 NOTE — Consult Note (Signed)
CARDIOLOGY CONSULT NOTE  Patient ID: Trevor Pitts MRN: [id number] DOB/AGE: 1924-06-16 83 y.o.  Admit date: 07/20/2016 Referring Physician  Linna Darner, MD Primary Physician:  Merrilee Seashore, MD Reason for Consultation  Arrhythmias  HPI: Trevor Pitts  is a 83 y.o. male  With Fairly active gentleman with history of chronic frequent PVCs, hypertension, hyperlipidemia, COPD with emphysema and bronchial reactive airway disease and on chronic home oxygen, stage III chronic kidney disease, has had frequent PVCs and while in cardiopulmonary rehabilitation in August 2018 had 5 beat run of nonsustained mental tachycardia. He underwent nuclear stress testing and office in August 2017 which revealed LVEF  32% with no evidence of ischemia, echocardiogram revealed EF of 45-50% with mild pulmonary hypertension. Overall felt to be low risk, in the absence of diabetes, chest pain, being asymptomatic without any dizziness or syncope, medical therapy was recommended. He is now admitted to the hospital with acute exacerbation in his COPD and while in the emergency room was found to have frequent PVCs, and what appears to be episodes of atrial tachycardia.  Patient states that since being in the hospital, his breathing has improved significantly. He admits to not feeling well for the fast 1 week to 10 days with fatigue, feeling feverish, cough but no chills or rigors. Cough is productive. No PND or orthopnea. Denies any leg edema.  Past Medical History:  Diagnosis Date  . Asthma   . Atrial fibrillation ([place])   . Cancer [facility])    prostate - sp radiation w/o recurrence  . CKD (chronic kidney disease) stage 2, GFR 60-89 ml/min 07/20/2016  . COPD (chronic obstructive pulmonary disease) ([place])   . GERD (gastroesophageal reflux disease)   . H/O hiatal hernia   . Hernia of abdominal cavity   . Hypertension   . Pneumonia   . Recurrent upper respiratory infection (URI)   . Shortness of breath      Past  Surgical History:  Procedure Laterality Date  . COLON SURGERY       Family History  Problem Relation Age of Onset  . Heart attack Father      Social History: Social History   Social History  . Marital status: Widowed    Spouse name: N/A  . Number of children: N/A  . Years of education: N/A   Occupational History  . Not on file.   Social History Main Topics  . Smoking status: Former Smoker    Packs/day: 0.50    Years: 25.00    Types: Cigarettes, Pipe, Cigars  . Smokeless tobacco: Never Used  . Alcohol use No  . Drug use: No  . Sexual activity: No   Other Topics Concern  . Not on file   Social History Narrative   ** Merged History Encounter **       Lives with wife           Prescriptions Prior to Admission  Medication Sig Dispense Refill Last Dose  . ALPRAZolam (XANAX) 0.25 MG tablet Take 0.25 mg by mouth 3 (three) times daily.   07/17/2016 at Unknown time  . arformoterol (BROVANA) 15 MCG/2ML NEBU Take 15 mcg by nebulization 2 (two) times daily.   07/19/2016 at Unknown time  . aspirin 81 MG tablet Take 81 mg by mouth daily.    07/17/2016 at Unknown time  . atorvastatin (LIPITOR) 80 MG tablet Take 40 mg by mouth every Monday, Wednesday, and Friday.    07/17/2016  . benazepril (LOTENSIN) 40 MG tablet  Take 40 mg by mouth 2 (two) times daily.    07/17/2016 at Unknown time  . budesonide (PULMICORT) 0.5 MG/2ML nebulizer solution Take 0.5 mg by nebulization 2 (two) times daily.   07/19/2016 at Unknown time  . cefdinir (OMNICEF) 300 MG capsule Take 300 mg by mouth 2 (two) times daily.   07/19/2016 at Unknown time  . famotidine (PEPCID) 20 MG tablet Take 20 mg by mouth at bedtime.    07/17/2016 at Unknown time  . metoprolol-hydrochlorothiazide (LOPRESSOR HCT) 50-25 MG tablet Take 1 tablet by mouth daily.   07/17/2016  . Multiple Vitamin (MULTIVITAMIN) capsule Take 1 capsule by mouth daily.   07/17/2016  . Multiple Vitamins-Minerals (ICAPS MV) TABS Take 1 tablet by mouth daily.     07/17/2016 at Unknown time  . OXYGEN-HELIUM IN Pt uses o2 2lpm with exertion as needed- [employer]   07/20/2016 at Unknown time  . predniSONE (DELTASONE) 20 MG tablet Take 40 mg by mouth daily for 3 days, then 20mg  by mouth daily for 3 days, then 10mg  daily for 3 days (Patient taking differently: Take 5 mg by mouth daily. ) 12 tablet 0 07/17/2016  . Probiotic CAPS Take 1 capsule by mouth daily.   07/17/2016 at Unknown time  . Tiotropium Bromide Monohydrate (SPIRIVA RESPIMAT IN) Inhale 2 puffs into the lungs daily.   07/17/2016  . [DISCONTINUED] albuterol (PROVENTIL) (2.5 MG/3ML) 0.083% nebulizer solution Take 3 mLs (2.5 mg total) by nebulization every 6 (six) hours as needed for wheezing or shortness of breath. (Patient not taking: Reported on 07/20/2016) 75 mL 12 Not Taking at Unknown time  . [DISCONTINUED] irbesartan (AVAPRO) 150 MG tablet Take 1 tablet (150 mg total) by mouth daily. (Patient not taking: Reported on 07/20/2016) 30 tablet 11 Not Taking at Unknown time  . [DISCONTINUED] oxyCODONE-acetaminophen (PERCOCET/ROXICET) 5-325 MG tablet Take 1 tablet by mouth every 4 (four) hours as needed for severe pain. (Patient not taking: Reported on 07/20/2016) 12 tablet 0 Not Taking at Unknown time   Review of Systems - General ROS: positive for  - fatigue and malaise Respiratory ROS: positive for - cough, shortness of breath, sputum changes, tachypnea and wheezing negative for - hemoptysis, pleuritic pain or stridor Cardiovascular ROS: negative for - chest pain, palpitations or rapid heart rate Gastrointestinal ROS: no abdominal pain, change in bowel habits, or black or bloody stools Neurological ROS: no TIA or stroke symptoms Other systems negative, walks with the help of a walker over the past 1 week only prior to that independent.   Physical Exam: Blood pressure (!) 147/86, pulse 78, temperature 97.8 F (36.6 C), resp. rate 18, SpO2 96 %.   General appearance: alert, cooperative, appears stated age, no  distress and mildly obese Neck: no adenopathy, no carotid bruit, no JVD, supple, symmetrical, trachea midline, thyroid not enlarged, symmetric, no tenderness/mass/nodules and Short neck Lungs: wheezes bilaterally and diffuse expirarory wheezing Heart: regular rate and rhythm, S1, S2 normal, no murmur, click, rub or gallop Abdomen: soft, non-tender; bowel sounds normal; no masses,  no organomegaly and mildly obese. surgical scan anterior with incisional reducible hernia present Extremities: extremities normal, atraumatic, no cyanosis or edema Neurologic: Grossly normal  Labs:  Lab Results  Component Value Date   WBC 9.8 07/21/2016   HGB 14.3 07/21/2016   HCT 41.7 07/21/2016   MCV 88.9 07/21/2016   PLT 218 07/21/2016    Recent Labs Lab 07/21/16 0716  NA 138  K 4.3  CL 101  CO2 25  BUN  40*  CREATININE 1.20  CALCIUM 8.7*  GLUCOSE 93   Radiology: Dg Chest 2 View  Result Date: 07/20/2016 CLINICAL DATA:  Shortness of Breath EXAM: CHEST  2 VIEW COMPARISON:  07/17/2016 FINDINGS: There is hyperinflation of the lungs compatible with COPD. Heart is normal size. Bibasilar atelectasis or scarring. Previously seen right lower lobe infiltrate has improved. Calcified granulomas in the lungs bilaterally. No effusions or acute bony abnormality. IMPRESSION: COPD.  Bibasilar atelectasis or scarring. Electronically Signed   By: Rolm Baptise M.D.   On: 07/20/2016 09:02    Scheduled Meds: . albuterol  5 mg Nebulization Once  . ALPRAZolam  0.25 mg Oral TID  . arformoterol  15 mcg Nebulization BID  . aspirin EC  81 mg Oral Daily  . atorvastatin  40 mg Oral Q M,W,F  . benazepril  40 mg Oral BID  . budesonide  0.5 mg Nebulization BID  . enoxaparin (LOVENOX) injection  40 mg Subcutaneous Q24H  . famotidine  20 mg Oral QHS  . fluticasone  1 spray Each Nare Daily  . hydrochlorothiazide  25 mg Oral Daily  . ipratropium-albuterol  3 mL Nebulization QID  . levofloxacin  500 mg Oral Daily  . mouth  rinse  15 mL Mouth Rinse BID  . metoprolol tartrate  50 mg Oral Daily  . predniSONE  60 mg Oral Q breakfast   Continuous Infusions: . sodium chloride 75 mL/hr at 07/21/16 0726   PRN Meds:.acetaminophen **OR** acetaminophen, albuterol, metoprolol tartrate, ondansetron **OR** ondansetron (ZOFRAN) IV  CARDIAC STUDIES:   EKG 07/20/2016: Normal sinus rhythm/sinus tachycardia at the rate of 111 beats a minute, PVCs. Otherwise no evidence of ischemia. Normal QT interval.   Lexiscan myoview stress test 10/14/2015: 1. The resting electrocardiogram demonstrated normal sinus rhythm, normal resting conduction, VPC and normal rest repolarization. Stress EKG is non-diagnostic for ischemia as it a pharmacologic stress using Lexiscan. Patient continued to have frequent PVCs throughout the stress test. Stress symptoms included dyspnea. 2. The perfusion imaging study demonstrates left ventricle to be normal in size both rest and stress images. There is mild gut uptake artifact in the inferior wall. There is no evidence of ischemia or scar. Left ventricular systolic function calculated by QGS was markedly depressed at 32% however visually LVEF appears to be normal without wall motion abnormality. This is an intermediate risk study, clinical correlation recommended.   Holter 01/29/2012: PVC, PCA. No heart blocks, no other complex arrhythmias. Impression: EKG 12/03/2015: Sinus rhythm at a rate of 77 bpm, left HL enlargement, normal axis, poor R-wave progression, cannot exclude anterior infarct old. No evidence of ischemia. Compared to EKG on 09/30/2015, rate has improved from 100 and now only isolated PVC.  Echocardiogram 10/22/2015: Left ventricle cavity is normal in size. Mild concentric hypertrophy of the left ventricle. Mild global hypokinesis. Doppler evidence of grade I (impaired) diastolic dysfunction. Visual EF is 45-50%. Left atrial cavity is mildly dilated at 4.2 cm. Mild (Grade I) aortic  regurgitation. Mild (Grade I) mitral regurgitation. Mild tricuspid regurgitation. Mild pulmonary hypertension. Pulmonary artery systolic pressure is estimated at 32 mm Hg.   ASSESSMENT AND PLAN:  1. Frequent PVCs noted on telemetry 2. Atrial tachycardia, brief, no telemetry strips available. 3. Hypertension 4. Hyperlipidemia  Recommendation: From cardiac standpoint he remained stable without angina pectoris or clinical evidence of acute decompensated heart failure. He is presently on a moderate to high-dose of metoprolol tartrate on a daily dose, needs to be changed to twice a day dosing and  consider switching to bisoprolol due to underlying COPD and bronchial asthma (I changed). Otherwise no cardiac workup necessary at this point.  Adrian Prows, MD 07/21/2016, 10:47 AM [facility] [place] Pager: [phone] Office: [phone] If no answer Cell [phone]

## 2016-07-22 DIAGNOSIS — R0602 Shortness of breath: Secondary | ICD-10-CM

## 2016-07-22 DIAGNOSIS — I10 Essential (primary) hypertension: Secondary | ICD-10-CM

## 2016-07-22 DIAGNOSIS — K219 Gastro-esophageal reflux disease without esophagitis: Secondary | ICD-10-CM

## 2016-07-22 DIAGNOSIS — J441 Chronic obstructive pulmonary disease with (acute) exacerbation: Principal | ICD-10-CM

## 2016-07-22 DIAGNOSIS — J9611 Chronic respiratory failure with hypoxia: Secondary | ICD-10-CM

## 2016-07-22 DIAGNOSIS — I471 Supraventricular tachycardia: Secondary | ICD-10-CM

## 2016-07-22 DIAGNOSIS — E784 Other hyperlipidemia: Secondary | ICD-10-CM

## 2016-07-22 DIAGNOSIS — N182 Chronic kidney disease, stage 2 (mild): Secondary | ICD-10-CM

## 2016-07-22 LAB — LEGIONELLA PNEUMOPHILA SEROGP 1 UR AG: L. pneumophila Serogp 1 Ur Ag: NEGATIVE

## 2016-07-22 LAB — PROCALCITONIN: Procalcitonin: <0.1 ng/mL

## 2016-07-22 MED ORDER — FLUTICASONE PROPIONATE 50 MCG/ACT NA SUSP: 1.0000 | Freq: Every day | NASAL | 1 refills | Status: DC

## 2016-07-22 MED ORDER — LEVOFLOXACIN 500 MG PO TABS: 500.0000 mg | ORAL_TABLET | Freq: Every day | ORAL | 0 refills | Status: DC

## 2016-07-22 MED ORDER — PREDNISONE 20 MG PO TABS: ORAL_TABLET | ORAL | 0 refills | Status: DC

## 2016-07-22 MED ORDER — PREDNISONE 5 MG PO TABS: 5.0000 mg | ORAL_TABLET | Freq: Every day | ORAL | 0 refills | Status: DC

## 2016-07-22 MED ORDER — GUAIFENESIN ER 600 MG PO TB12: 600.0000 mg | ORAL_TABLET | Freq: Two times a day (BID) | ORAL | 0 refills | Status: DC

## 2016-07-22 MED ORDER — IPRATROPIUM-ALBUTEROL 0.5-2.5 (3) MG/3ML IN SOLN: 3.0000 mL | Freq: Two times a day (BID) | RESPIRATORY_TRACT | Status: DC

## 2016-07-22 NOTE — Progress Notes (Signed)
Trevor Pitts to be D/C'd to home with home health per MD order.  Discussed with the patient and all questions fully answered.  VSS, Skin clean, dry and intact without evidence of skin break down, no evidence of skin tears noted. IV catheter discontinued intact. Site without signs and symptoms of complications. Dressing and pressure applied.  An After Visit Summary was printed and given to the patient. Patient received prescriptions.  D/c education completed with patient/family including follow up instructions, medication list, d/c activities limitations if indicated, with other d/c instructions as indicated by MD - patient able to verbalize understanding, all questions fully answered.   Patient instructed to return to ED, call 911, or call MD for any changes in condition.   Patient escorted via [place], and D/C home via private auto.  [name] 07/22/2016 4:07 PM

## 2016-07-22 NOTE — Discharge Summary (Signed)
Physician Discharge Summary  Trevor Pitts KAJ:[id number] DOB: [birth year] DOA: 07/20/2016  PCP: Merrilee Seashore, MD  Admit date: 07/20/2016 Discharge date: 07/22/2016  Time spent: 35 minutes  Recommendations for Outpatient Follow-up:  1. Reassess blood pressure and adjust antihypertensive regimen as needed 2. Repeat basic metabolic to follow electrolytes and renal function 3. Make sure that the patient follow-up with cardiology as an outpatient for further evaluation of nonsustained paroxysmal atrial fibrillation. 4. Follow-up resolution of his COPD exacerbation.   Discharge Diagnoses:  Active Problems:   HLD (hyperlipidemia)   COPD exacerbation (HCC)   CAP (community acquired pneumonia)   Chronic respiratory failure with hypoxia (HCC)   Respiratory distress   PAF (paroxysmal atrial fibrillation) (HCC)   Nonsustained paroxysmal supraventricular tachycardia (HCC)   CKD (chronic kidney disease) stage 2, GFR 60-89 ml/min   Acute exacerbation of chronic obstructive pulmonary disease (COPD) (HCC)   Shortness of breath   Gastroesophageal reflux disease   Discharge Condition: Stable and improved. Patient has been discharged home with home health services and instruction to follow-up with his PCP in 10 days.  Diet recommendation: Heart healthy diet  Filed Weights   07/21/16 1434  Weight: 73.8 kg (162 lb 11.2 oz)    Brief History of present illness:  83 year old male with medical history significant for paroxysmal atrial fibrillation (status post ablation and no longer on anticoagulation), hypertension, chronic kidney disease disease stage II, GERD, generalized anxiety and chronic respiratory failure secondary to COPD (chronically on 2 L of oxygen supplementation); who presented to the hospital secondary to increased shortness of breath and wheezing. Patient has a recent admission to [facility] (07/18/16) secondary to CAP.  Hospital Course:  1-acute on chronic COPD  exacerbation/chronic resp failure (on 2L [place] at baseline)  -Appears to be exacerbated by recent pneumonia/bronchiectasis -Patient treated with antibiotics, IV steroids and nebulizer treatment. -At discharge he was significantly improved and back to his chronic nasal cannula supplementation; patient able to speak in full sentences and just complaining of intermittent productive cough and some difficulty with expectoration. -Has been discharge on steroids tapering, by mouth Levaquin to complete antibiotic therapy, resumption of his home nebulizer/inhaler treatment, Mucinex and the use of flutter valve. -Patient will follow up with PCP in approximately 10 days. -Continue oxygen supplementation  2-nonsustained paroxysmal atrial fibrillation: CHADsVASC score 2 -Most likely triggered by his COPD/bronchiectasis -Spontaneously resolve -Will resume his metoprolol for rate control and continue aspirin -Patient is status post ablation and will benefit of follow-up with cardiology as an outpatient.  3-chronic kidney disease stage II -Overall stable and with renal function at baseline -Receive gentle fluid resuscitation on admission -Advised to keep himself well-hydrated -Patient HCTZ was held overnight  4-hypertension: -Advised to follow heart healthy diet -Continue benazepril -Metoprolol And HCTZ has been resumed at discharge  5-anxiety -Continue alprazolam  6-hyperlipidemia -Continue statins  7-GERD -continue Pepcid   8-physical deconditioning and weakness -Discharge home with home health services for physical therapy and occupational therapy  Procedures:  See below for x-ray reports  Consultations:  None  Discharge Exam: Vitals:   07/22/16 0935 07/22/16 1450  BP: 135/74 135/68  Pulse: 85 72  Resp:  20  Temp:  97.8 F (36.6 C)    General: Afebrile, improved in his breathing, able to speak in full sentences and denying chest pain. Patient expressed a still present  intermittently productive cough and some difficulty with expectoration. Cardiovascular: S1 and S2, positive systolic ejection murmur, no rubs, no gallops Respiratory: Positive rhonchi,  good air movement bilaterally, mild expiratory wheezing abdomen: Soft, nontender, nondistended, positive bowel sounds Extremities: No edema, no cyanosis, no clubbing  Discharge Instructions   Discharge Instructions    Diet - low sodium heart healthy    Complete by:  As directed    Discharge instructions    Complete by:  As directed    Take medications as prescribed Please use flutter valve (8-10 repetitions every 3 hours, while awake) and Mucinex as instructed Increase activity as tolerated Continue the use of her oxygen supplementation as previously instructed Arrange follow-up with PCP in 10 days Keep yourself well-hydrated     Current Discharge Medication List    START taking these medications   Details  fluticasone (FLONASE) 50 MCG/ACT nasal spray Place 1 spray into both nostrils daily. Qty: 16 g, Refills: 1    guaiFENesin (MUCINEX) 600 MG 12 hr tablet Take 1 tablet (600 mg total) by mouth 2 (two) times daily. Qty: 60 tablet, Refills: 0    levofloxacin (LEVAQUIN) 500 MG tablet Take 1 tablet (500 mg total) by mouth daily. Qty: 6 tablet, Refills: 0      CONTINUE these medications which have CHANGED   Details  !! predniSONE (DELTASONE) 20 MG tablet Take 3 tablets by mouth X 2 Days; then 2 tablets by mouth X 2 Days; then 1 tablet by mouth X 3 days; then half tablet by mouth daily X 3 days; and then resume daily low dose of prednisone as previously instructed (5 mg by mouth daily) Qty: 18 tablet, Refills: 0    !! predniSONE (DELTASONE) 5 MG tablet Take 1 tablet (5 mg total) by mouth daily. Qty: 30 tablet, Refills: 0     !! - Potential duplicate medications found. Please discuss with provider.    CONTINUE these medications which have NOT CHANGED   Details  ALPRAZolam (XANAX) 0.25 MG tablet  Take 0.25 mg by mouth 3 (three) times daily.    arformoterol (BROVANA) 15 MCG/2ML NEBU Take 15 mcg by nebulization 2 (two) times daily.    aspirin 81 MG tablet Take 81 mg by mouth daily.     atorvastatin (LIPITOR) 80 MG tablet Take 40 mg by mouth every Monday, Wednesday, and Friday.     benazepril (LOTENSIN) 40 MG tablet Take 40 mg by mouth 2 (two) times daily.     budesonide (PULMICORT) 0.5 MG/2ML nebulizer solution Take 0.5 mg by nebulization 2 (two) times daily.    famotidine (PEPCID) 20 MG tablet Take 20 mg by mouth at bedtime.     metoprolol-hydrochlorothiazide (LOPRESSOR HCT) 50-25 MG tablet Take 1 tablet by mouth daily.    Multiple Vitamin (MULTIVITAMIN) capsule Take 1 capsule by mouth daily.    Multiple Vitamins-Minerals (ICAPS MV) TABS Take 1 tablet by mouth daily.     OXYGEN-HELIUM IN Pt uses o2 2lpm with exertion as needed- [employer]    Probiotic CAPS Take 1 capsule by mouth daily.    Tiotropium Bromide Monohydrate (SPIRIVA RESPIMAT IN) Inhale 2 puffs into the lungs daily.      STOP taking these medications     cefdinir (OMNICEF) 300 MG capsule        Allergies  Allergen Reactions  . Sulfa Antibiotics    Follow-up Information    [facility].   Why:  Home health services arranged, office will call and set up home visits Contact information: [address] [place] [address] [phone]        Merrilee Seashore, MD.  Schedule an appointment as soon as possible for a visit in 10 day(s).   Specialty:  Internal Medicine Contact information: [address] [place] [address] [phone]           The results of significant diagnostics from this hospitalization (including imaging, microbiology, ancillary and laboratory) are listed below for reference.    Significant Diagnostic Studies: Dg Chest 2 View  Result Date: 07/20/2016 CLINICAL DATA:  Shortness of Breath EXAM: CHEST  2 VIEW COMPARISON:   07/17/2016 FINDINGS: There is hyperinflation of the lungs compatible with COPD. Heart is normal size. Bibasilar atelectasis or scarring. Previously seen right lower lobe infiltrate has improved. Calcified granulomas in the lungs bilaterally. No effusions or acute bony abnormality. IMPRESSION: COPD.  Bibasilar atelectasis or scarring. Electronically Signed   By: Rolm Baptise M.D.   On: 07/20/2016 09:02    Microbiology: Recent Results (from the past 240 hour(s))  Culture, blood (routine x 2) Call MD if unable to obtain prior to antibiotics being given     Status: None (Preliminary result)   Collection Time: 07/20/16  3:30 PM  Result Value Ref Range Status   Specimen Description BLOOD LEFT ANTECUBITAL  Final   Special Requests IN PEDIATRIC BOTTLE Blood Culture adequate volume  Final   Culture NO GROWTH < 24 HOURS  Final   Report Status PENDING  Incomplete  Culture, blood (routine x 2) Call MD if unable to obtain prior to antibiotics being given     Status: None (Preliminary result)   Collection Time: 07/20/16  3:33 PM  Result Value Ref Range Status   Specimen Description BLOOD RIGHT ANTECUBITAL  Final   Special Requests IN PEDIATRIC BOTTLE Blood Culture adequate volume  Final   Culture NO GROWTH < 24 HOURS  Final   Report Status PENDING  Incomplete     Labs: Basic Metabolic Panel:  Recent Labs Lab 07/20/16 0738 07/21/16 0716  NA 138 138  K 3.6 4.3  CL 98* 101  CO2 28 25  GLUCOSE 81 93  BUN 36* 40*  CREATININE 1.29* 1.20  CALCIUM 9.3 8.7*   CBC:  Recent Labs Lab 07/20/16 0738 07/21/16 0716  WBC 13.8* 9.8  HGB 14.9 14.3  HCT 44.3 41.7  MCV 89.5 88.9  PLT 261 218    Signed:  Barton Dubois MD.  [facility] 07/22/2016, 3:46 PM

## 2016-07-24 DIAGNOSIS — Z87891 Personal history of nicotine dependence: Secondary | ICD-10-CM | POA: Diagnosis not present

## 2016-07-24 DIAGNOSIS — J9611 Chronic respiratory failure with hypoxia: Secondary | ICD-10-CM | POA: Diagnosis not present

## 2016-07-24 DIAGNOSIS — Z7982 Long term (current) use of aspirin: Secondary | ICD-10-CM | POA: Diagnosis not present

## 2016-07-24 DIAGNOSIS — Z6825 Body mass index (BMI) 25.0-25.9, adult: Secondary | ICD-10-CM | POA: Diagnosis not present

## 2016-07-24 DIAGNOSIS — Z8546 Personal history of malignant neoplasm of prostate: Secondary | ICD-10-CM | POA: Diagnosis not present

## 2016-07-24 DIAGNOSIS — J189 Pneumonia, unspecified organism: Secondary | ICD-10-CM | POA: Diagnosis not present

## 2016-07-24 DIAGNOSIS — K439 Ventral hernia without obstruction or gangrene: Secondary | ICD-10-CM | POA: Diagnosis not present

## 2016-07-24 DIAGNOSIS — I4891 Unspecified atrial fibrillation: Secondary | ICD-10-CM | POA: Diagnosis not present

## 2016-07-24 DIAGNOSIS — Z7951 Long term (current) use of inhaled steroids: Secondary | ICD-10-CM | POA: Diagnosis not present

## 2016-07-24 DIAGNOSIS — I129 Hypertensive chronic kidney disease with stage 1 through stage 4 chronic kidney disease, or unspecified chronic kidney disease: Secondary | ICD-10-CM | POA: Diagnosis not present

## 2016-07-24 DIAGNOSIS — J441 Chronic obstructive pulmonary disease with (acute) exacerbation: Secondary | ICD-10-CM | POA: Diagnosis not present

## 2016-07-24 DIAGNOSIS — J45909 Unspecified asthma, uncomplicated: Secondary | ICD-10-CM | POA: Diagnosis not present

## 2016-07-24 DIAGNOSIS — J440 Chronic obstructive pulmonary disease with acute lower respiratory infection: Secondary | ICD-10-CM | POA: Diagnosis not present

## 2016-07-24 DIAGNOSIS — K219 Gastro-esophageal reflux disease without esophagitis: Secondary | ICD-10-CM | POA: Diagnosis not present

## 2016-07-24 DIAGNOSIS — N182 Chronic kidney disease, stage 2 (mild): Secondary | ICD-10-CM | POA: Diagnosis not present

## 2016-07-24 DIAGNOSIS — Z9981 Dependence on supplemental oxygen: Secondary | ICD-10-CM | POA: Diagnosis not present

## 2016-07-25 LAB — CULTURE, BLOOD (ROUTINE X 2)
CULTURE: NO GROWTH
Culture: NO GROWTH
SPECIAL REQUESTS: ADEQUATE
Special Requests: ADEQUATE

## 2016-07-26 DIAGNOSIS — J9611 Chronic respiratory failure with hypoxia: Secondary | ICD-10-CM | POA: Diagnosis not present

## 2016-07-26 DIAGNOSIS — J45909 Unspecified asthma, uncomplicated: Secondary | ICD-10-CM | POA: Diagnosis not present

## 2016-07-26 DIAGNOSIS — J441 Chronic obstructive pulmonary disease with (acute) exacerbation: Secondary | ICD-10-CM | POA: Diagnosis not present

## 2016-07-26 DIAGNOSIS — I129 Hypertensive chronic kidney disease with stage 1 through stage 4 chronic kidney disease, or unspecified chronic kidney disease: Secondary | ICD-10-CM | POA: Diagnosis not present

## 2016-07-26 DIAGNOSIS — J440 Chronic obstructive pulmonary disease with acute lower respiratory infection: Secondary | ICD-10-CM | POA: Diagnosis not present

## 2016-07-26 DIAGNOSIS — J189 Pneumonia, unspecified organism: Secondary | ICD-10-CM | POA: Diagnosis not present

## 2016-07-27 DIAGNOSIS — J9611 Chronic respiratory failure with hypoxia: Secondary | ICD-10-CM | POA: Diagnosis not present

## 2016-07-27 DIAGNOSIS — J441 Chronic obstructive pulmonary disease with (acute) exacerbation: Secondary | ICD-10-CM | POA: Diagnosis not present

## 2016-07-27 DIAGNOSIS — J45909 Unspecified asthma, uncomplicated: Secondary | ICD-10-CM | POA: Diagnosis not present

## 2016-07-27 DIAGNOSIS — J189 Pneumonia, unspecified organism: Secondary | ICD-10-CM | POA: Diagnosis not present

## 2016-07-27 DIAGNOSIS — I129 Hypertensive chronic kidney disease with stage 1 through stage 4 chronic kidney disease, or unspecified chronic kidney disease: Secondary | ICD-10-CM | POA: Diagnosis not present

## 2016-07-27 DIAGNOSIS — J440 Chronic obstructive pulmonary disease with acute lower respiratory infection: Secondary | ICD-10-CM | POA: Diagnosis not present

## 2016-07-28 DIAGNOSIS — J45909 Unspecified asthma, uncomplicated: Secondary | ICD-10-CM | POA: Diagnosis not present

## 2016-07-28 DIAGNOSIS — J189 Pneumonia, unspecified organism: Secondary | ICD-10-CM | POA: Diagnosis not present

## 2016-07-28 DIAGNOSIS — J440 Chronic obstructive pulmonary disease with acute lower respiratory infection: Secondary | ICD-10-CM | POA: Diagnosis not present

## 2016-07-28 DIAGNOSIS — J9611 Chronic respiratory failure with hypoxia: Secondary | ICD-10-CM | POA: Diagnosis not present

## 2016-07-28 DIAGNOSIS — J441 Chronic obstructive pulmonary disease with (acute) exacerbation: Secondary | ICD-10-CM | POA: Diagnosis not present

## 2016-07-28 DIAGNOSIS — I129 Hypertensive chronic kidney disease with stage 1 through stage 4 chronic kidney disease, or unspecified chronic kidney disease: Secondary | ICD-10-CM | POA: Diagnosis not present

## 2016-07-29 DIAGNOSIS — J189 Pneumonia, unspecified organism: Secondary | ICD-10-CM | POA: Diagnosis not present

## 2016-07-29 DIAGNOSIS — J440 Chronic obstructive pulmonary disease with acute lower respiratory infection: Secondary | ICD-10-CM | POA: Diagnosis not present

## 2016-07-29 DIAGNOSIS — J45909 Unspecified asthma, uncomplicated: Secondary | ICD-10-CM | POA: Diagnosis not present

## 2016-07-29 DIAGNOSIS — J441 Chronic obstructive pulmonary disease with (acute) exacerbation: Secondary | ICD-10-CM | POA: Diagnosis not present

## 2016-07-29 DIAGNOSIS — J9611 Chronic respiratory failure with hypoxia: Secondary | ICD-10-CM | POA: Diagnosis not present

## 2016-07-29 DIAGNOSIS — I129 Hypertensive chronic kidney disease with stage 1 through stage 4 chronic kidney disease, or unspecified chronic kidney disease: Secondary | ICD-10-CM | POA: Diagnosis not present

## 2016-07-31 DIAGNOSIS — J45909 Unspecified asthma, uncomplicated: Secondary | ICD-10-CM | POA: Diagnosis not present

## 2016-07-31 DIAGNOSIS — J189 Pneumonia, unspecified organism: Secondary | ICD-10-CM | POA: Diagnosis not present

## 2016-07-31 DIAGNOSIS — J440 Chronic obstructive pulmonary disease with acute lower respiratory infection: Secondary | ICD-10-CM | POA: Diagnosis not present

## 2016-07-31 DIAGNOSIS — J441 Chronic obstructive pulmonary disease with (acute) exacerbation: Secondary | ICD-10-CM | POA: Diagnosis not present

## 2016-07-31 DIAGNOSIS — I129 Hypertensive chronic kidney disease with stage 1 through stage 4 chronic kidney disease, or unspecified chronic kidney disease: Secondary | ICD-10-CM | POA: Diagnosis not present

## 2016-07-31 DIAGNOSIS — J9611 Chronic respiratory failure with hypoxia: Secondary | ICD-10-CM | POA: Diagnosis not present

## 2016-08-01 DIAGNOSIS — J440 Chronic obstructive pulmonary disease with acute lower respiratory infection: Secondary | ICD-10-CM | POA: Diagnosis not present

## 2016-08-01 DIAGNOSIS — J441 Chronic obstructive pulmonary disease with (acute) exacerbation: Secondary | ICD-10-CM | POA: Diagnosis not present

## 2016-08-01 DIAGNOSIS — J189 Pneumonia, unspecified organism: Secondary | ICD-10-CM | POA: Diagnosis not present

## 2016-08-01 DIAGNOSIS — I129 Hypertensive chronic kidney disease with stage 1 through stage 4 chronic kidney disease, or unspecified chronic kidney disease: Secondary | ICD-10-CM | POA: Diagnosis not present

## 2016-08-01 DIAGNOSIS — J9611 Chronic respiratory failure with hypoxia: Secondary | ICD-10-CM | POA: Diagnosis not present

## 2016-08-01 DIAGNOSIS — J45909 Unspecified asthma, uncomplicated: Secondary | ICD-10-CM | POA: Diagnosis not present

## 2016-08-03 DIAGNOSIS — J45909 Unspecified asthma, uncomplicated: Secondary | ICD-10-CM | POA: Diagnosis not present

## 2016-08-03 DIAGNOSIS — J189 Pneumonia, unspecified organism: Secondary | ICD-10-CM | POA: Diagnosis not present

## 2016-08-03 DIAGNOSIS — J9611 Chronic respiratory failure with hypoxia: Secondary | ICD-10-CM | POA: Diagnosis not present

## 2016-08-03 DIAGNOSIS — I129 Hypertensive chronic kidney disease with stage 1 through stage 4 chronic kidney disease, or unspecified chronic kidney disease: Secondary | ICD-10-CM | POA: Diagnosis not present

## 2016-08-03 DIAGNOSIS — J441 Chronic obstructive pulmonary disease with (acute) exacerbation: Secondary | ICD-10-CM | POA: Diagnosis not present

## 2016-08-03 DIAGNOSIS — J440 Chronic obstructive pulmonary disease with acute lower respiratory infection: Secondary | ICD-10-CM | POA: Diagnosis not present

## 2016-08-04 DIAGNOSIS — J9611 Chronic respiratory failure with hypoxia: Secondary | ICD-10-CM | POA: Diagnosis not present

## 2016-08-04 DIAGNOSIS — J440 Chronic obstructive pulmonary disease with acute lower respiratory infection: Secondary | ICD-10-CM | POA: Diagnosis not present

## 2016-08-04 DIAGNOSIS — J45909 Unspecified asthma, uncomplicated: Secondary | ICD-10-CM | POA: Diagnosis not present

## 2016-08-04 DIAGNOSIS — I129 Hypertensive chronic kidney disease with stage 1 through stage 4 chronic kidney disease, or unspecified chronic kidney disease: Secondary | ICD-10-CM | POA: Diagnosis not present

## 2016-08-04 DIAGNOSIS — J189 Pneumonia, unspecified organism: Secondary | ICD-10-CM | POA: Diagnosis not present

## 2016-08-04 DIAGNOSIS — J441 Chronic obstructive pulmonary disease with (acute) exacerbation: Secondary | ICD-10-CM | POA: Diagnosis not present

## 2016-08-06 DIAGNOSIS — I129 Hypertensive chronic kidney disease with stage 1 through stage 4 chronic kidney disease, or unspecified chronic kidney disease: Secondary | ICD-10-CM | POA: Diagnosis not present

## 2016-08-06 DIAGNOSIS — J440 Chronic obstructive pulmonary disease with acute lower respiratory infection: Secondary | ICD-10-CM | POA: Diagnosis not present

## 2016-08-06 DIAGNOSIS — J45909 Unspecified asthma, uncomplicated: Secondary | ICD-10-CM | POA: Diagnosis not present

## 2016-08-06 DIAGNOSIS — J441 Chronic obstructive pulmonary disease with (acute) exacerbation: Secondary | ICD-10-CM | POA: Diagnosis not present

## 2016-08-06 DIAGNOSIS — J9611 Chronic respiratory failure with hypoxia: Secondary | ICD-10-CM | POA: Diagnosis not present

## 2016-08-06 DIAGNOSIS — J189 Pneumonia, unspecified organism: Secondary | ICD-10-CM | POA: Diagnosis not present

## 2016-08-07 DIAGNOSIS — J440 Chronic obstructive pulmonary disease with acute lower respiratory infection: Secondary | ICD-10-CM | POA: Diagnosis not present

## 2016-08-07 DIAGNOSIS — J189 Pneumonia, unspecified organism: Secondary | ICD-10-CM | POA: Diagnosis not present

## 2016-08-07 DIAGNOSIS — I129 Hypertensive chronic kidney disease with stage 1 through stage 4 chronic kidney disease, or unspecified chronic kidney disease: Secondary | ICD-10-CM | POA: Diagnosis not present

## 2016-08-07 DIAGNOSIS — J45909 Unspecified asthma, uncomplicated: Secondary | ICD-10-CM | POA: Diagnosis not present

## 2016-08-07 DIAGNOSIS — J441 Chronic obstructive pulmonary disease with (acute) exacerbation: Secondary | ICD-10-CM | POA: Diagnosis not present

## 2016-08-07 DIAGNOSIS — J9611 Chronic respiratory failure with hypoxia: Secondary | ICD-10-CM | POA: Diagnosis not present

## 2016-08-10 DIAGNOSIS — J441 Chronic obstructive pulmonary disease with (acute) exacerbation: Secondary | ICD-10-CM | POA: Diagnosis not present

## 2016-08-10 DIAGNOSIS — J9611 Chronic respiratory failure with hypoxia: Secondary | ICD-10-CM | POA: Diagnosis not present

## 2016-08-10 DIAGNOSIS — J189 Pneumonia, unspecified organism: Secondary | ICD-10-CM | POA: Diagnosis not present

## 2016-08-10 DIAGNOSIS — I129 Hypertensive chronic kidney disease with stage 1 through stage 4 chronic kidney disease, or unspecified chronic kidney disease: Secondary | ICD-10-CM | POA: Diagnosis not present

## 2016-08-10 DIAGNOSIS — J440 Chronic obstructive pulmonary disease with acute lower respiratory infection: Secondary | ICD-10-CM | POA: Diagnosis not present

## 2016-08-10 DIAGNOSIS — J45909 Unspecified asthma, uncomplicated: Secondary | ICD-10-CM | POA: Diagnosis not present

## 2016-08-11 DIAGNOSIS — J45909 Unspecified asthma, uncomplicated: Secondary | ICD-10-CM | POA: Diagnosis not present

## 2016-08-11 DIAGNOSIS — J440 Chronic obstructive pulmonary disease with acute lower respiratory infection: Secondary | ICD-10-CM | POA: Diagnosis not present

## 2016-08-11 DIAGNOSIS — E782 Mixed hyperlipidemia: Secondary | ICD-10-CM | POA: Diagnosis not present

## 2016-08-11 DIAGNOSIS — I2510 Atherosclerotic heart disease of native coronary artery without angina pectoris: Secondary | ICD-10-CM | POA: Diagnosis not present

## 2016-08-11 DIAGNOSIS — J189 Pneumonia, unspecified organism: Secondary | ICD-10-CM | POA: Diagnosis not present

## 2016-08-11 DIAGNOSIS — I10 Essential (primary) hypertension: Secondary | ICD-10-CM | POA: Diagnosis not present

## 2016-08-11 DIAGNOSIS — J9611 Chronic respiratory failure with hypoxia: Secondary | ICD-10-CM | POA: Diagnosis not present

## 2016-08-11 DIAGNOSIS — I129 Hypertensive chronic kidney disease with stage 1 through stage 4 chronic kidney disease, or unspecified chronic kidney disease: Secondary | ICD-10-CM | POA: Diagnosis not present

## 2016-08-11 DIAGNOSIS — J441 Chronic obstructive pulmonary disease with (acute) exacerbation: Secondary | ICD-10-CM | POA: Diagnosis not present

## 2016-08-12 DIAGNOSIS — J9611 Chronic respiratory failure with hypoxia: Secondary | ICD-10-CM | POA: Diagnosis not present

## 2016-08-12 DIAGNOSIS — J189 Pneumonia, unspecified organism: Secondary | ICD-10-CM | POA: Diagnosis not present

## 2016-08-12 DIAGNOSIS — I129 Hypertensive chronic kidney disease with stage 1 through stage 4 chronic kidney disease, or unspecified chronic kidney disease: Secondary | ICD-10-CM | POA: Diagnosis not present

## 2016-08-12 DIAGNOSIS — J45909 Unspecified asthma, uncomplicated: Secondary | ICD-10-CM | POA: Diagnosis not present

## 2016-08-12 DIAGNOSIS — J440 Chronic obstructive pulmonary disease with acute lower respiratory infection: Secondary | ICD-10-CM | POA: Diagnosis not present

## 2016-08-12 DIAGNOSIS — J441 Chronic obstructive pulmonary disease with (acute) exacerbation: Secondary | ICD-10-CM | POA: Diagnosis not present

## 2016-08-13 DIAGNOSIS — I129 Hypertensive chronic kidney disease with stage 1 through stage 4 chronic kidney disease, or unspecified chronic kidney disease: Secondary | ICD-10-CM | POA: Diagnosis not present

## 2016-08-13 DIAGNOSIS — J9611 Chronic respiratory failure with hypoxia: Secondary | ICD-10-CM | POA: Diagnosis not present

## 2016-08-13 DIAGNOSIS — J440 Chronic obstructive pulmonary disease with acute lower respiratory infection: Secondary | ICD-10-CM | POA: Diagnosis not present

## 2016-08-13 DIAGNOSIS — J45909 Unspecified asthma, uncomplicated: Secondary | ICD-10-CM | POA: Diagnosis not present

## 2016-08-13 DIAGNOSIS — J441 Chronic obstructive pulmonary disease with (acute) exacerbation: Secondary | ICD-10-CM | POA: Diagnosis not present

## 2016-08-13 DIAGNOSIS — J189 Pneumonia, unspecified organism: Secondary | ICD-10-CM | POA: Diagnosis not present

## 2016-08-14 DIAGNOSIS — I129 Hypertensive chronic kidney disease with stage 1 through stage 4 chronic kidney disease, or unspecified chronic kidney disease: Secondary | ICD-10-CM | POA: Diagnosis not present

## 2016-08-14 DIAGNOSIS — J9611 Chronic respiratory failure with hypoxia: Secondary | ICD-10-CM | POA: Diagnosis not present

## 2016-08-14 DIAGNOSIS — J440 Chronic obstructive pulmonary disease with acute lower respiratory infection: Secondary | ICD-10-CM | POA: Diagnosis not present

## 2016-08-14 DIAGNOSIS — J189 Pneumonia, unspecified organism: Secondary | ICD-10-CM | POA: Diagnosis not present

## 2016-08-14 DIAGNOSIS — J45909 Unspecified asthma, uncomplicated: Secondary | ICD-10-CM | POA: Diagnosis not present

## 2016-08-14 DIAGNOSIS — J441 Chronic obstructive pulmonary disease with (acute) exacerbation: Secondary | ICD-10-CM | POA: Diagnosis not present

## 2016-08-17 DIAGNOSIS — J9611 Chronic respiratory failure with hypoxia: Secondary | ICD-10-CM | POA: Diagnosis not present

## 2016-08-17 DIAGNOSIS — J45909 Unspecified asthma, uncomplicated: Secondary | ICD-10-CM | POA: Diagnosis not present

## 2016-08-17 DIAGNOSIS — J441 Chronic obstructive pulmonary disease with (acute) exacerbation: Secondary | ICD-10-CM | POA: Diagnosis not present

## 2016-08-17 DIAGNOSIS — J189 Pneumonia, unspecified organism: Secondary | ICD-10-CM | POA: Diagnosis not present

## 2016-08-17 DIAGNOSIS — I129 Hypertensive chronic kidney disease with stage 1 through stage 4 chronic kidney disease, or unspecified chronic kidney disease: Secondary | ICD-10-CM | POA: Diagnosis not present

## 2016-08-17 DIAGNOSIS — J440 Chronic obstructive pulmonary disease with acute lower respiratory infection: Secondary | ICD-10-CM | POA: Diagnosis not present

## 2016-08-18 DIAGNOSIS — I2510 Atherosclerotic heart disease of native coronary artery without angina pectoris: Secondary | ICD-10-CM | POA: Diagnosis not present

## 2016-08-18 DIAGNOSIS — E782 Mixed hyperlipidemia: Secondary | ICD-10-CM | POA: Diagnosis not present

## 2016-08-18 DIAGNOSIS — I129 Hypertensive chronic kidney disease with stage 1 through stage 4 chronic kidney disease, or unspecified chronic kidney disease: Secondary | ICD-10-CM | POA: Diagnosis not present

## 2016-08-18 DIAGNOSIS — Z0000 Encounter for general adult medical examination without abnormal findings: Secondary | ICD-10-CM | POA: Diagnosis not present

## 2016-08-18 DIAGNOSIS — R7303 Prediabetes: Secondary | ICD-10-CM | POA: Diagnosis not present

## 2016-08-21 DIAGNOSIS — J440 Chronic obstructive pulmonary disease with acute lower respiratory infection: Secondary | ICD-10-CM | POA: Diagnosis not present

## 2016-08-21 DIAGNOSIS — I129 Hypertensive chronic kidney disease with stage 1 through stage 4 chronic kidney disease, or unspecified chronic kidney disease: Secondary | ICD-10-CM | POA: Diagnosis not present

## 2016-08-21 DIAGNOSIS — J189 Pneumonia, unspecified organism: Secondary | ICD-10-CM | POA: Diagnosis not present

## 2016-08-21 DIAGNOSIS — J45909 Unspecified asthma, uncomplicated: Secondary | ICD-10-CM | POA: Diagnosis not present

## 2016-08-21 DIAGNOSIS — J441 Chronic obstructive pulmonary disease with (acute) exacerbation: Secondary | ICD-10-CM | POA: Diagnosis not present

## 2016-08-21 DIAGNOSIS — J9611 Chronic respiratory failure with hypoxia: Secondary | ICD-10-CM | POA: Diagnosis not present

## 2016-08-25 DIAGNOSIS — J45909 Unspecified asthma, uncomplicated: Secondary | ICD-10-CM | POA: Diagnosis not present

## 2016-08-25 DIAGNOSIS — J441 Chronic obstructive pulmonary disease with (acute) exacerbation: Secondary | ICD-10-CM | POA: Diagnosis not present

## 2016-08-25 DIAGNOSIS — J189 Pneumonia, unspecified organism: Secondary | ICD-10-CM | POA: Diagnosis not present

## 2016-08-25 DIAGNOSIS — J9611 Chronic respiratory failure with hypoxia: Secondary | ICD-10-CM | POA: Diagnosis not present

## 2016-08-25 DIAGNOSIS — J440 Chronic obstructive pulmonary disease with acute lower respiratory infection: Secondary | ICD-10-CM | POA: Diagnosis not present

## 2016-08-25 DIAGNOSIS — I129 Hypertensive chronic kidney disease with stage 1 through stage 4 chronic kidney disease, or unspecified chronic kidney disease: Secondary | ICD-10-CM | POA: Diagnosis not present

## 2016-08-26 DIAGNOSIS — J441 Chronic obstructive pulmonary disease with (acute) exacerbation: Secondary | ICD-10-CM | POA: Diagnosis not present

## 2016-08-26 DIAGNOSIS — J9611 Chronic respiratory failure with hypoxia: Secondary | ICD-10-CM | POA: Diagnosis not present

## 2016-08-26 DIAGNOSIS — J440 Chronic obstructive pulmonary disease with acute lower respiratory infection: Secondary | ICD-10-CM | POA: Diagnosis not present

## 2016-08-26 DIAGNOSIS — I129 Hypertensive chronic kidney disease with stage 1 through stage 4 chronic kidney disease, or unspecified chronic kidney disease: Secondary | ICD-10-CM | POA: Diagnosis not present

## 2016-08-26 DIAGNOSIS — J189 Pneumonia, unspecified organism: Secondary | ICD-10-CM | POA: Diagnosis not present

## 2016-08-26 DIAGNOSIS — J45909 Unspecified asthma, uncomplicated: Secondary | ICD-10-CM | POA: Diagnosis not present

## 2016-08-31 DIAGNOSIS — J9611 Chronic respiratory failure with hypoxia: Secondary | ICD-10-CM | POA: Diagnosis not present

## 2016-08-31 DIAGNOSIS — J189 Pneumonia, unspecified organism: Secondary | ICD-10-CM | POA: Diagnosis not present

## 2016-08-31 DIAGNOSIS — I129 Hypertensive chronic kidney disease with stage 1 through stage 4 chronic kidney disease, or unspecified chronic kidney disease: Secondary | ICD-10-CM | POA: Diagnosis not present

## 2016-08-31 DIAGNOSIS — J45909 Unspecified asthma, uncomplicated: Secondary | ICD-10-CM | POA: Diagnosis not present

## 2016-08-31 DIAGNOSIS — J441 Chronic obstructive pulmonary disease with (acute) exacerbation: Secondary | ICD-10-CM | POA: Diagnosis not present

## 2016-08-31 DIAGNOSIS — J440 Chronic obstructive pulmonary disease with acute lower respiratory infection: Secondary | ICD-10-CM | POA: Diagnosis not present

## 2016-09-02 DIAGNOSIS — J189 Pneumonia, unspecified organism: Secondary | ICD-10-CM | POA: Diagnosis not present

## 2016-09-02 DIAGNOSIS — J441 Chronic obstructive pulmonary disease with (acute) exacerbation: Secondary | ICD-10-CM | POA: Diagnosis not present

## 2016-09-02 DIAGNOSIS — I129 Hypertensive chronic kidney disease with stage 1 through stage 4 chronic kidney disease, or unspecified chronic kidney disease: Secondary | ICD-10-CM | POA: Diagnosis not present

## 2016-09-02 DIAGNOSIS — J9611 Chronic respiratory failure with hypoxia: Secondary | ICD-10-CM | POA: Diagnosis not present

## 2016-09-02 DIAGNOSIS — J440 Chronic obstructive pulmonary disease with acute lower respiratory infection: Secondary | ICD-10-CM | POA: Diagnosis not present

## 2016-09-02 DIAGNOSIS — J45909 Unspecified asthma, uncomplicated: Secondary | ICD-10-CM | POA: Diagnosis not present

## 2016-09-07 DIAGNOSIS — J189 Pneumonia, unspecified organism: Secondary | ICD-10-CM | POA: Diagnosis not present

## 2016-09-07 DIAGNOSIS — J441 Chronic obstructive pulmonary disease with (acute) exacerbation: Secondary | ICD-10-CM | POA: Diagnosis not present

## 2016-09-07 DIAGNOSIS — J45909 Unspecified asthma, uncomplicated: Secondary | ICD-10-CM | POA: Diagnosis not present

## 2016-09-07 DIAGNOSIS — I129 Hypertensive chronic kidney disease with stage 1 through stage 4 chronic kidney disease, or unspecified chronic kidney disease: Secondary | ICD-10-CM | POA: Diagnosis not present

## 2016-09-07 DIAGNOSIS — J9611 Chronic respiratory failure with hypoxia: Secondary | ICD-10-CM | POA: Diagnosis not present

## 2016-09-07 DIAGNOSIS — J440 Chronic obstructive pulmonary disease with acute lower respiratory infection: Secondary | ICD-10-CM | POA: Diagnosis not present

## 2016-09-11 DIAGNOSIS — J45909 Unspecified asthma, uncomplicated: Secondary | ICD-10-CM | POA: Diagnosis not present

## 2016-09-11 DIAGNOSIS — J441 Chronic obstructive pulmonary disease with (acute) exacerbation: Secondary | ICD-10-CM | POA: Diagnosis not present

## 2016-09-11 DIAGNOSIS — J9611 Chronic respiratory failure with hypoxia: Secondary | ICD-10-CM | POA: Diagnosis not present

## 2016-09-11 DIAGNOSIS — J189 Pneumonia, unspecified organism: Secondary | ICD-10-CM | POA: Diagnosis not present

## 2016-09-11 DIAGNOSIS — J440 Chronic obstructive pulmonary disease with acute lower respiratory infection: Secondary | ICD-10-CM | POA: Diagnosis not present

## 2016-09-11 DIAGNOSIS — I129 Hypertensive chronic kidney disease with stage 1 through stage 4 chronic kidney disease, or unspecified chronic kidney disease: Secondary | ICD-10-CM | POA: Diagnosis not present

## 2016-09-18 DIAGNOSIS — J441 Chronic obstructive pulmonary disease with (acute) exacerbation: Secondary | ICD-10-CM | POA: Diagnosis not present

## 2016-09-18 DIAGNOSIS — J9611 Chronic respiratory failure with hypoxia: Secondary | ICD-10-CM | POA: Diagnosis not present

## 2016-09-18 DIAGNOSIS — J45909 Unspecified asthma, uncomplicated: Secondary | ICD-10-CM | POA: Diagnosis not present

## 2016-09-18 DIAGNOSIS — I129 Hypertensive chronic kidney disease with stage 1 through stage 4 chronic kidney disease, or unspecified chronic kidney disease: Secondary | ICD-10-CM | POA: Diagnosis not present

## 2016-09-18 DIAGNOSIS — J440 Chronic obstructive pulmonary disease with acute lower respiratory infection: Secondary | ICD-10-CM | POA: Diagnosis not present

## 2016-09-18 DIAGNOSIS — J189 Pneumonia, unspecified organism: Secondary | ICD-10-CM | POA: Diagnosis not present

## 2016-11-25 DIAGNOSIS — Z23 Encounter for immunization: Secondary | ICD-10-CM | POA: Diagnosis not present

## 2016-12-22 DIAGNOSIS — I2510 Atherosclerotic heart disease of native coronary artery without angina pectoris: Secondary | ICD-10-CM | POA: Diagnosis not present

## 2016-12-22 DIAGNOSIS — R7303 Prediabetes: Secondary | ICD-10-CM | POA: Diagnosis not present

## 2016-12-22 DIAGNOSIS — E782 Mixed hyperlipidemia: Secondary | ICD-10-CM | POA: Diagnosis not present

## 2016-12-29 DIAGNOSIS — I129 Hypertensive chronic kidney disease with stage 1 through stage 4 chronic kidney disease, or unspecified chronic kidney disease: Secondary | ICD-10-CM | POA: Diagnosis not present

## 2016-12-29 DIAGNOSIS — Z23 Encounter for immunization: Secondary | ICD-10-CM | POA: Diagnosis not present

## 2016-12-29 DIAGNOSIS — E782 Mixed hyperlipidemia: Secondary | ICD-10-CM | POA: Diagnosis not present

## 2016-12-29 DIAGNOSIS — I2510 Atherosclerotic heart disease of native coronary artery without angina pectoris: Secondary | ICD-10-CM | POA: Diagnosis not present

## 2016-12-29 DIAGNOSIS — R7303 Prediabetes: Secondary | ICD-10-CM | POA: Diagnosis not present

## 2016-12-30 DIAGNOSIS — H43813 Vitreous degeneration, bilateral: Secondary | ICD-10-CM | POA: Diagnosis not present

## 2016-12-30 DIAGNOSIS — H34231 Retinal artery branch occlusion, right eye: Secondary | ICD-10-CM | POA: Diagnosis not present

## 2016-12-30 DIAGNOSIS — H353132 Nonexudative age-related macular degeneration, bilateral, intermediate dry stage: Secondary | ICD-10-CM | POA: Diagnosis not present

## 2016-12-30 DIAGNOSIS — H3582 Retinal ischemia: Secondary | ICD-10-CM | POA: Diagnosis not present

## 2017-05-03 DIAGNOSIS — Z8551 Personal history of malignant neoplasm of bladder: Secondary | ICD-10-CM | POA: Diagnosis not present

## 2017-05-03 DIAGNOSIS — Z8546 Personal history of malignant neoplasm of prostate: Secondary | ICD-10-CM | POA: Diagnosis not present

## 2017-05-03 DIAGNOSIS — R3121 Asymptomatic microscopic hematuria: Secondary | ICD-10-CM | POA: Diagnosis not present

## 2017-05-03 DIAGNOSIS — N401 Enlarged prostate with lower urinary tract symptoms: Secondary | ICD-10-CM | POA: Diagnosis not present

## 2017-05-03 DIAGNOSIS — R3915 Urgency of urination: Secondary | ICD-10-CM | POA: Diagnosis not present

## 2017-05-18 DIAGNOSIS — E782 Mixed hyperlipidemia: Secondary | ICD-10-CM | POA: Diagnosis not present

## 2017-05-18 DIAGNOSIS — I129 Hypertensive chronic kidney disease with stage 1 through stage 4 chronic kidney disease, or unspecified chronic kidney disease: Secondary | ICD-10-CM | POA: Diagnosis not present

## 2017-05-18 DIAGNOSIS — I2510 Atherosclerotic heart disease of native coronary artery without angina pectoris: Secondary | ICD-10-CM | POA: Diagnosis not present

## 2017-05-24 DIAGNOSIS — Z8546 Personal history of malignant neoplasm of prostate: Secondary | ICD-10-CM | POA: Diagnosis not present

## 2017-05-24 DIAGNOSIS — C674 Malignant neoplasm of posterior wall of bladder: Secondary | ICD-10-CM | POA: Diagnosis not present

## 2017-05-24 DIAGNOSIS — R311 Benign essential microscopic hematuria: Secondary | ICD-10-CM | POA: Diagnosis not present

## 2017-05-25 DIAGNOSIS — E782 Mixed hyperlipidemia: Secondary | ICD-10-CM | POA: Diagnosis not present

## 2017-05-25 DIAGNOSIS — I129 Hypertensive chronic kidney disease with stage 1 through stage 4 chronic kidney disease, or unspecified chronic kidney disease: Secondary | ICD-10-CM | POA: Diagnosis not present

## 2017-05-25 DIAGNOSIS — I2510 Atherosclerotic heart disease of native coronary artery without angina pectoris: Secondary | ICD-10-CM | POA: Diagnosis not present

## 2017-05-25 DIAGNOSIS — J449 Chronic obstructive pulmonary disease, unspecified: Secondary | ICD-10-CM | POA: Diagnosis not present

## 2017-05-25 DIAGNOSIS — R7303 Prediabetes: Secondary | ICD-10-CM | POA: Diagnosis not present

## 2017-09-26 DIAGNOSIS — H35359 Cystoid macular degeneration, unspecified eye: Secondary | ICD-10-CM | POA: Diagnosis not present

## 2017-09-28 DIAGNOSIS — Z0000 Encounter for general adult medical examination without abnormal findings: Secondary | ICD-10-CM | POA: Diagnosis not present

## 2017-09-28 DIAGNOSIS — I129 Hypertensive chronic kidney disease with stage 1 through stage 4 chronic kidney disease, or unspecified chronic kidney disease: Secondary | ICD-10-CM | POA: Diagnosis not present

## 2017-09-28 DIAGNOSIS — E782 Mixed hyperlipidemia: Secondary | ICD-10-CM | POA: Diagnosis not present

## 2017-09-28 DIAGNOSIS — I10 Essential (primary) hypertension: Secondary | ICD-10-CM | POA: Diagnosis not present

## 2017-09-28 DIAGNOSIS — I2510 Atherosclerotic heart disease of native coronary artery without angina pectoris: Secondary | ICD-10-CM | POA: Diagnosis not present

## 2017-09-28 DIAGNOSIS — R7303 Prediabetes: Secondary | ICD-10-CM | POA: Diagnosis not present

## 2017-10-05 DIAGNOSIS — E782 Mixed hyperlipidemia: Secondary | ICD-10-CM | POA: Diagnosis not present

## 2017-10-05 DIAGNOSIS — I2510 Atherosclerotic heart disease of native coronary artery without angina pectoris: Secondary | ICD-10-CM | POA: Diagnosis not present

## 2017-10-05 DIAGNOSIS — J9611 Chronic respiratory failure with hypoxia: Secondary | ICD-10-CM | POA: Diagnosis not present

## 2017-10-05 DIAGNOSIS — J449 Chronic obstructive pulmonary disease, unspecified: Secondary | ICD-10-CM | POA: Diagnosis not present

## 2017-10-05 DIAGNOSIS — R7303 Prediabetes: Secondary | ICD-10-CM | POA: Diagnosis not present

## 2017-10-05 DIAGNOSIS — F0390 Unspecified dementia without behavioral disturbance: Secondary | ICD-10-CM | POA: Diagnosis not present

## 2017-10-05 DIAGNOSIS — I129 Hypertensive chronic kidney disease with stage 1 through stage 4 chronic kidney disease, or unspecified chronic kidney disease: Secondary | ICD-10-CM | POA: Diagnosis not present

## 2017-11-04 DIAGNOSIS — I2510 Atherosclerotic heart disease of native coronary artery without angina pectoris: Secondary | ICD-10-CM | POA: Diagnosis not present

## 2017-11-04 DIAGNOSIS — I129 Hypertensive chronic kidney disease with stage 1 through stage 4 chronic kidney disease, or unspecified chronic kidney disease: Secondary | ICD-10-CM | POA: Diagnosis not present

## 2017-11-04 DIAGNOSIS — F0390 Unspecified dementia without behavioral disturbance: Secondary | ICD-10-CM | POA: Diagnosis not present

## 2017-11-04 DIAGNOSIS — J449 Chronic obstructive pulmonary disease, unspecified: Secondary | ICD-10-CM | POA: Diagnosis not present

## 2017-11-04 DIAGNOSIS — E782 Mixed hyperlipidemia: Secondary | ICD-10-CM | POA: Diagnosis not present

## 2017-11-16 DIAGNOSIS — R311 Benign essential microscopic hematuria: Secondary | ICD-10-CM | POA: Diagnosis not present

## 2018-01-05 DIAGNOSIS — H34231 Retinal artery branch occlusion, right eye: Secondary | ICD-10-CM | POA: Diagnosis not present

## 2018-01-05 DIAGNOSIS — H43813 Vitreous degeneration, bilateral: Secondary | ICD-10-CM | POA: Diagnosis not present

## 2018-01-05 DIAGNOSIS — H3582 Retinal ischemia: Secondary | ICD-10-CM | POA: Diagnosis not present

## 2018-01-05 DIAGNOSIS — H353132 Nonexudative age-related macular degeneration, bilateral, intermediate dry stage: Secondary | ICD-10-CM | POA: Diagnosis not present

## 2018-01-20 DIAGNOSIS — M5413 Radiculopathy, cervicothoracic region: Secondary | ICD-10-CM | POA: Diagnosis not present

## 2018-01-20 DIAGNOSIS — M62830 Muscle spasm of back: Secondary | ICD-10-CM | POA: Diagnosis not present

## 2018-01-20 DIAGNOSIS — M546 Pain in thoracic spine: Secondary | ICD-10-CM | POA: Diagnosis not present

## 2018-01-20 DIAGNOSIS — M50323 Other cervical disc degeneration at C6-C7 level: Secondary | ICD-10-CM | POA: Diagnosis not present

## 2018-01-20 DIAGNOSIS — M9901 Segmental and somatic dysfunction of cervical region: Secondary | ICD-10-CM | POA: Diagnosis not present

## 2018-01-20 DIAGNOSIS — M25611 Stiffness of right shoulder, not elsewhere classified: Secondary | ICD-10-CM | POA: Diagnosis not present

## 2018-01-20 DIAGNOSIS — M9902 Segmental and somatic dysfunction of thoracic region: Secondary | ICD-10-CM | POA: Diagnosis not present

## 2018-01-24 DIAGNOSIS — M9901 Segmental and somatic dysfunction of cervical region: Secondary | ICD-10-CM | POA: Diagnosis not present

## 2018-01-24 DIAGNOSIS — M50323 Other cervical disc degeneration at C6-C7 level: Secondary | ICD-10-CM | POA: Diagnosis not present

## 2018-01-24 DIAGNOSIS — M62830 Muscle spasm of back: Secondary | ICD-10-CM | POA: Diagnosis not present

## 2018-01-24 DIAGNOSIS — M25611 Stiffness of right shoulder, not elsewhere classified: Secondary | ICD-10-CM | POA: Diagnosis not present

## 2018-01-24 DIAGNOSIS — M9902 Segmental and somatic dysfunction of thoracic region: Secondary | ICD-10-CM | POA: Diagnosis not present

## 2018-01-24 DIAGNOSIS — M546 Pain in thoracic spine: Secondary | ICD-10-CM | POA: Diagnosis not present

## 2018-01-24 DIAGNOSIS — M5413 Radiculopathy, cervicothoracic region: Secondary | ICD-10-CM | POA: Diagnosis not present

## 2018-01-26 DIAGNOSIS — M62830 Muscle spasm of back: Secondary | ICD-10-CM | POA: Diagnosis not present

## 2018-01-26 DIAGNOSIS — M50323 Other cervical disc degeneration at C6-C7 level: Secondary | ICD-10-CM | POA: Diagnosis not present

## 2018-01-26 DIAGNOSIS — M546 Pain in thoracic spine: Secondary | ICD-10-CM | POA: Diagnosis not present

## 2018-01-26 DIAGNOSIS — M25611 Stiffness of right shoulder, not elsewhere classified: Secondary | ICD-10-CM | POA: Diagnosis not present

## 2018-01-26 DIAGNOSIS — M9902 Segmental and somatic dysfunction of thoracic region: Secondary | ICD-10-CM | POA: Diagnosis not present

## 2018-01-26 DIAGNOSIS — M5413 Radiculopathy, cervicothoracic region: Secondary | ICD-10-CM | POA: Diagnosis not present

## 2018-01-26 DIAGNOSIS — M9901 Segmental and somatic dysfunction of cervical region: Secondary | ICD-10-CM | POA: Diagnosis not present

## 2018-01-31 DIAGNOSIS — M5413 Radiculopathy, cervicothoracic region: Secondary | ICD-10-CM | POA: Diagnosis not present

## 2018-01-31 DIAGNOSIS — M546 Pain in thoracic spine: Secondary | ICD-10-CM | POA: Diagnosis not present

## 2018-01-31 DIAGNOSIS — M9901 Segmental and somatic dysfunction of cervical region: Secondary | ICD-10-CM | POA: Diagnosis not present

## 2018-01-31 DIAGNOSIS — M62830 Muscle spasm of back: Secondary | ICD-10-CM | POA: Diagnosis not present

## 2018-01-31 DIAGNOSIS — M25611 Stiffness of right shoulder, not elsewhere classified: Secondary | ICD-10-CM | POA: Diagnosis not present

## 2018-01-31 DIAGNOSIS — M9902 Segmental and somatic dysfunction of thoracic region: Secondary | ICD-10-CM | POA: Diagnosis not present

## 2018-01-31 DIAGNOSIS — M50323 Other cervical disc degeneration at C6-C7 level: Secondary | ICD-10-CM | POA: Diagnosis not present

## 2018-02-01 DIAGNOSIS — M25611 Stiffness of right shoulder, not elsewhere classified: Secondary | ICD-10-CM | POA: Diagnosis not present

## 2018-02-01 DIAGNOSIS — M62830 Muscle spasm of back: Secondary | ICD-10-CM | POA: Diagnosis not present

## 2018-02-01 DIAGNOSIS — M50323 Other cervical disc degeneration at C6-C7 level: Secondary | ICD-10-CM | POA: Diagnosis not present

## 2018-02-01 DIAGNOSIS — M9902 Segmental and somatic dysfunction of thoracic region: Secondary | ICD-10-CM | POA: Diagnosis not present

## 2018-02-01 DIAGNOSIS — M5413 Radiculopathy, cervicothoracic region: Secondary | ICD-10-CM | POA: Diagnosis not present

## 2018-02-01 DIAGNOSIS — M546 Pain in thoracic spine: Secondary | ICD-10-CM | POA: Diagnosis not present

## 2018-02-01 DIAGNOSIS — M9901 Segmental and somatic dysfunction of cervical region: Secondary | ICD-10-CM | POA: Diagnosis not present

## 2018-02-02 DIAGNOSIS — M9901 Segmental and somatic dysfunction of cervical region: Secondary | ICD-10-CM | POA: Diagnosis not present

## 2018-02-02 DIAGNOSIS — M9902 Segmental and somatic dysfunction of thoracic region: Secondary | ICD-10-CM | POA: Diagnosis not present

## 2018-02-02 DIAGNOSIS — M62830 Muscle spasm of back: Secondary | ICD-10-CM | POA: Diagnosis not present

## 2018-02-02 DIAGNOSIS — M546 Pain in thoracic spine: Secondary | ICD-10-CM | POA: Diagnosis not present

## 2018-02-02 DIAGNOSIS — M50323 Other cervical disc degeneration at C6-C7 level: Secondary | ICD-10-CM | POA: Diagnosis not present

## 2018-02-02 DIAGNOSIS — M5413 Radiculopathy, cervicothoracic region: Secondary | ICD-10-CM | POA: Diagnosis not present

## 2018-02-02 DIAGNOSIS — M25611 Stiffness of right shoulder, not elsewhere classified: Secondary | ICD-10-CM | POA: Diagnosis not present

## 2018-02-07 DIAGNOSIS — M546 Pain in thoracic spine: Secondary | ICD-10-CM | POA: Diagnosis not present

## 2018-02-07 DIAGNOSIS — M9901 Segmental and somatic dysfunction of cervical region: Secondary | ICD-10-CM | POA: Diagnosis not present

## 2018-02-07 DIAGNOSIS — M5413 Radiculopathy, cervicothoracic region: Secondary | ICD-10-CM | POA: Diagnosis not present

## 2018-02-07 DIAGNOSIS — M25611 Stiffness of right shoulder, not elsewhere classified: Secondary | ICD-10-CM | POA: Diagnosis not present

## 2018-02-07 DIAGNOSIS — M62830 Muscle spasm of back: Secondary | ICD-10-CM | POA: Diagnosis not present

## 2018-02-07 DIAGNOSIS — M9902 Segmental and somatic dysfunction of thoracic region: Secondary | ICD-10-CM | POA: Diagnosis not present

## 2018-02-07 DIAGNOSIS — M50323 Other cervical disc degeneration at C6-C7 level: Secondary | ICD-10-CM | POA: Diagnosis not present

## 2018-02-09 DIAGNOSIS — M546 Pain in thoracic spine: Secondary | ICD-10-CM | POA: Diagnosis not present

## 2018-02-09 DIAGNOSIS — M5413 Radiculopathy, cervicothoracic region: Secondary | ICD-10-CM | POA: Diagnosis not present

## 2018-02-09 DIAGNOSIS — M9902 Segmental and somatic dysfunction of thoracic region: Secondary | ICD-10-CM | POA: Diagnosis not present

## 2018-02-09 DIAGNOSIS — M9901 Segmental and somatic dysfunction of cervical region: Secondary | ICD-10-CM | POA: Diagnosis not present

## 2018-02-09 DIAGNOSIS — M25611 Stiffness of right shoulder, not elsewhere classified: Secondary | ICD-10-CM | POA: Diagnosis not present

## 2018-02-09 DIAGNOSIS — M62830 Muscle spasm of back: Secondary | ICD-10-CM | POA: Diagnosis not present

## 2018-02-09 DIAGNOSIS — M50323 Other cervical disc degeneration at C6-C7 level: Secondary | ICD-10-CM | POA: Diagnosis not present

## 2018-02-11 DIAGNOSIS — M9902 Segmental and somatic dysfunction of thoracic region: Secondary | ICD-10-CM | POA: Diagnosis not present

## 2018-02-11 DIAGNOSIS — M62830 Muscle spasm of back: Secondary | ICD-10-CM | POA: Diagnosis not present

## 2018-02-11 DIAGNOSIS — M5413 Radiculopathy, cervicothoracic region: Secondary | ICD-10-CM | POA: Diagnosis not present

## 2018-02-11 DIAGNOSIS — M25611 Stiffness of right shoulder, not elsewhere classified: Secondary | ICD-10-CM | POA: Diagnosis not present

## 2018-02-11 DIAGNOSIS — M9901 Segmental and somatic dysfunction of cervical region: Secondary | ICD-10-CM | POA: Diagnosis not present

## 2018-02-11 DIAGNOSIS — M50323 Other cervical disc degeneration at C6-C7 level: Secondary | ICD-10-CM | POA: Diagnosis not present

## 2018-02-11 DIAGNOSIS — M546 Pain in thoracic spine: Secondary | ICD-10-CM | POA: Diagnosis not present

## 2018-02-14 DIAGNOSIS — M9902 Segmental and somatic dysfunction of thoracic region: Secondary | ICD-10-CM | POA: Diagnosis not present

## 2018-02-14 DIAGNOSIS — M546 Pain in thoracic spine: Secondary | ICD-10-CM | POA: Diagnosis not present

## 2018-02-14 DIAGNOSIS — M62830 Muscle spasm of back: Secondary | ICD-10-CM | POA: Diagnosis not present

## 2018-02-14 DIAGNOSIS — M25611 Stiffness of right shoulder, not elsewhere classified: Secondary | ICD-10-CM | POA: Diagnosis not present

## 2018-02-14 DIAGNOSIS — M9901 Segmental and somatic dysfunction of cervical region: Secondary | ICD-10-CM | POA: Diagnosis not present

## 2018-02-14 DIAGNOSIS — M5413 Radiculopathy, cervicothoracic region: Secondary | ICD-10-CM | POA: Diagnosis not present

## 2018-02-14 DIAGNOSIS — M50323 Other cervical disc degeneration at C6-C7 level: Secondary | ICD-10-CM | POA: Diagnosis not present

## 2018-02-16 DIAGNOSIS — M62830 Muscle spasm of back: Secondary | ICD-10-CM | POA: Diagnosis not present

## 2018-02-16 DIAGNOSIS — M9902 Segmental and somatic dysfunction of thoracic region: Secondary | ICD-10-CM | POA: Diagnosis not present

## 2018-02-16 DIAGNOSIS — M25611 Stiffness of right shoulder, not elsewhere classified: Secondary | ICD-10-CM | POA: Diagnosis not present

## 2018-02-16 DIAGNOSIS — M546 Pain in thoracic spine: Secondary | ICD-10-CM | POA: Diagnosis not present

## 2018-02-16 DIAGNOSIS — M5413 Radiculopathy, cervicothoracic region: Secondary | ICD-10-CM | POA: Diagnosis not present

## 2018-02-16 DIAGNOSIS — M9901 Segmental and somatic dysfunction of cervical region: Secondary | ICD-10-CM | POA: Diagnosis not present

## 2018-02-16 DIAGNOSIS — M50323 Other cervical disc degeneration at C6-C7 level: Secondary | ICD-10-CM | POA: Diagnosis not present

## 2018-02-18 DIAGNOSIS — M9901 Segmental and somatic dysfunction of cervical region: Secondary | ICD-10-CM | POA: Diagnosis not present

## 2018-02-18 DIAGNOSIS — M50323 Other cervical disc degeneration at C6-C7 level: Secondary | ICD-10-CM | POA: Diagnosis not present

## 2018-02-18 DIAGNOSIS — M9902 Segmental and somatic dysfunction of thoracic region: Secondary | ICD-10-CM | POA: Diagnosis not present

## 2018-02-18 DIAGNOSIS — M25611 Stiffness of right shoulder, not elsewhere classified: Secondary | ICD-10-CM | POA: Diagnosis not present

## 2018-02-18 DIAGNOSIS — M546 Pain in thoracic spine: Secondary | ICD-10-CM | POA: Diagnosis not present

## 2018-02-18 DIAGNOSIS — M5413 Radiculopathy, cervicothoracic region: Secondary | ICD-10-CM | POA: Diagnosis not present

## 2018-02-18 DIAGNOSIS — M62830 Muscle spasm of back: Secondary | ICD-10-CM | POA: Diagnosis not present

## 2018-02-21 DIAGNOSIS — M9901 Segmental and somatic dysfunction of cervical region: Secondary | ICD-10-CM | POA: Diagnosis not present

## 2018-02-21 DIAGNOSIS — M62830 Muscle spasm of back: Secondary | ICD-10-CM | POA: Diagnosis not present

## 2018-02-21 DIAGNOSIS — M9902 Segmental and somatic dysfunction of thoracic region: Secondary | ICD-10-CM | POA: Diagnosis not present

## 2018-02-21 DIAGNOSIS — M25611 Stiffness of right shoulder, not elsewhere classified: Secondary | ICD-10-CM | POA: Diagnosis not present

## 2018-02-21 DIAGNOSIS — M50323 Other cervical disc degeneration at C6-C7 level: Secondary | ICD-10-CM | POA: Diagnosis not present

## 2018-02-21 DIAGNOSIS — M5413 Radiculopathy, cervicothoracic region: Secondary | ICD-10-CM | POA: Diagnosis not present

## 2018-02-21 DIAGNOSIS — M546 Pain in thoracic spine: Secondary | ICD-10-CM | POA: Diagnosis not present

## 2018-02-23 DIAGNOSIS — M25611 Stiffness of right shoulder, not elsewhere classified: Secondary | ICD-10-CM | POA: Diagnosis not present

## 2018-02-23 DIAGNOSIS — M5413 Radiculopathy, cervicothoracic region: Secondary | ICD-10-CM | POA: Diagnosis not present

## 2018-02-23 DIAGNOSIS — M546 Pain in thoracic spine: Secondary | ICD-10-CM | POA: Diagnosis not present

## 2018-02-23 DIAGNOSIS — M62830 Muscle spasm of back: Secondary | ICD-10-CM | POA: Diagnosis not present

## 2018-02-23 DIAGNOSIS — M50323 Other cervical disc degeneration at C6-C7 level: Secondary | ICD-10-CM | POA: Diagnosis not present

## 2018-02-23 DIAGNOSIS — M9901 Segmental and somatic dysfunction of cervical region: Secondary | ICD-10-CM | POA: Diagnosis not present

## 2018-02-23 DIAGNOSIS — M9902 Segmental and somatic dysfunction of thoracic region: Secondary | ICD-10-CM | POA: Diagnosis not present

## 2018-03-03 DIAGNOSIS — J441 Chronic obstructive pulmonary disease with (acute) exacerbation: Secondary | ICD-10-CM | POA: Diagnosis not present

## 2018-03-03 DIAGNOSIS — J449 Chronic obstructive pulmonary disease, unspecified: Secondary | ICD-10-CM | POA: Diagnosis not present

## 2018-03-03 DIAGNOSIS — I129 Hypertensive chronic kidney disease with stage 1 through stage 4 chronic kidney disease, or unspecified chronic kidney disease: Secondary | ICD-10-CM | POA: Diagnosis not present

## 2018-03-03 DIAGNOSIS — J9611 Chronic respiratory failure with hypoxia: Secondary | ICD-10-CM | POA: Diagnosis not present

## 2018-03-07 DIAGNOSIS — M50323 Other cervical disc degeneration at C6-C7 level: Secondary | ICD-10-CM | POA: Diagnosis not present

## 2018-03-07 DIAGNOSIS — M5413 Radiculopathy, cervicothoracic region: Secondary | ICD-10-CM | POA: Diagnosis not present

## 2018-03-07 DIAGNOSIS — M9901 Segmental and somatic dysfunction of cervical region: Secondary | ICD-10-CM | POA: Diagnosis not present

## 2018-03-07 DIAGNOSIS — M25611 Stiffness of right shoulder, not elsewhere classified: Secondary | ICD-10-CM | POA: Diagnosis not present

## 2018-03-07 DIAGNOSIS — M9902 Segmental and somatic dysfunction of thoracic region: Secondary | ICD-10-CM | POA: Diagnosis not present

## 2018-03-07 DIAGNOSIS — M546 Pain in thoracic spine: Secondary | ICD-10-CM | POA: Diagnosis not present

## 2018-03-07 DIAGNOSIS — M62830 Muscle spasm of back: Secondary | ICD-10-CM | POA: Diagnosis not present

## 2018-03-14 DIAGNOSIS — M50323 Other cervical disc degeneration at C6-C7 level: Secondary | ICD-10-CM | POA: Diagnosis not present

## 2018-03-14 DIAGNOSIS — M5413 Radiculopathy, cervicothoracic region: Secondary | ICD-10-CM | POA: Diagnosis not present

## 2018-03-14 DIAGNOSIS — M62830 Muscle spasm of back: Secondary | ICD-10-CM | POA: Diagnosis not present

## 2018-03-14 DIAGNOSIS — M9901 Segmental and somatic dysfunction of cervical region: Secondary | ICD-10-CM | POA: Diagnosis not present

## 2018-03-14 DIAGNOSIS — M546 Pain in thoracic spine: Secondary | ICD-10-CM | POA: Diagnosis not present

## 2018-03-14 DIAGNOSIS — M9902 Segmental and somatic dysfunction of thoracic region: Secondary | ICD-10-CM | POA: Diagnosis not present

## 2018-03-14 DIAGNOSIS — M25611 Stiffness of right shoulder, not elsewhere classified: Secondary | ICD-10-CM | POA: Diagnosis not present

## 2018-03-15 DIAGNOSIS — I2510 Atherosclerotic heart disease of native coronary artery without angina pectoris: Secondary | ICD-10-CM | POA: Diagnosis not present

## 2018-03-15 DIAGNOSIS — I129 Hypertensive chronic kidney disease with stage 1 through stage 4 chronic kidney disease, or unspecified chronic kidney disease: Secondary | ICD-10-CM | POA: Diagnosis not present

## 2018-03-15 DIAGNOSIS — E782 Mixed hyperlipidemia: Secondary | ICD-10-CM | POA: Diagnosis not present

## 2018-03-22 DIAGNOSIS — R079 Chest pain, unspecified: Secondary | ICD-10-CM | POA: Diagnosis not present

## 2018-03-22 DIAGNOSIS — E782 Mixed hyperlipidemia: Secondary | ICD-10-CM | POA: Diagnosis not present

## 2018-03-22 DIAGNOSIS — R071 Chest pain on breathing: Secondary | ICD-10-CM | POA: Diagnosis not present

## 2018-03-22 DIAGNOSIS — I2510 Atherosclerotic heart disease of native coronary artery without angina pectoris: Secondary | ICD-10-CM | POA: Diagnosis not present

## 2018-03-22 DIAGNOSIS — I129 Hypertensive chronic kidney disease with stage 1 through stage 4 chronic kidney disease, or unspecified chronic kidney disease: Secondary | ICD-10-CM | POA: Diagnosis not present

## 2018-03-22 DIAGNOSIS — J449 Chronic obstructive pulmonary disease, unspecified: Secondary | ICD-10-CM | POA: Diagnosis not present

## 2018-03-31 IMAGING — DX DG CHEST 2V
2 series · 2 of 2 positions shown · non-contrast
Comparison: 12/06/2015

CLINICAL DATA: Shortness of Breath

EXAM:
CHEST  2 VIEW

[w chest pa]
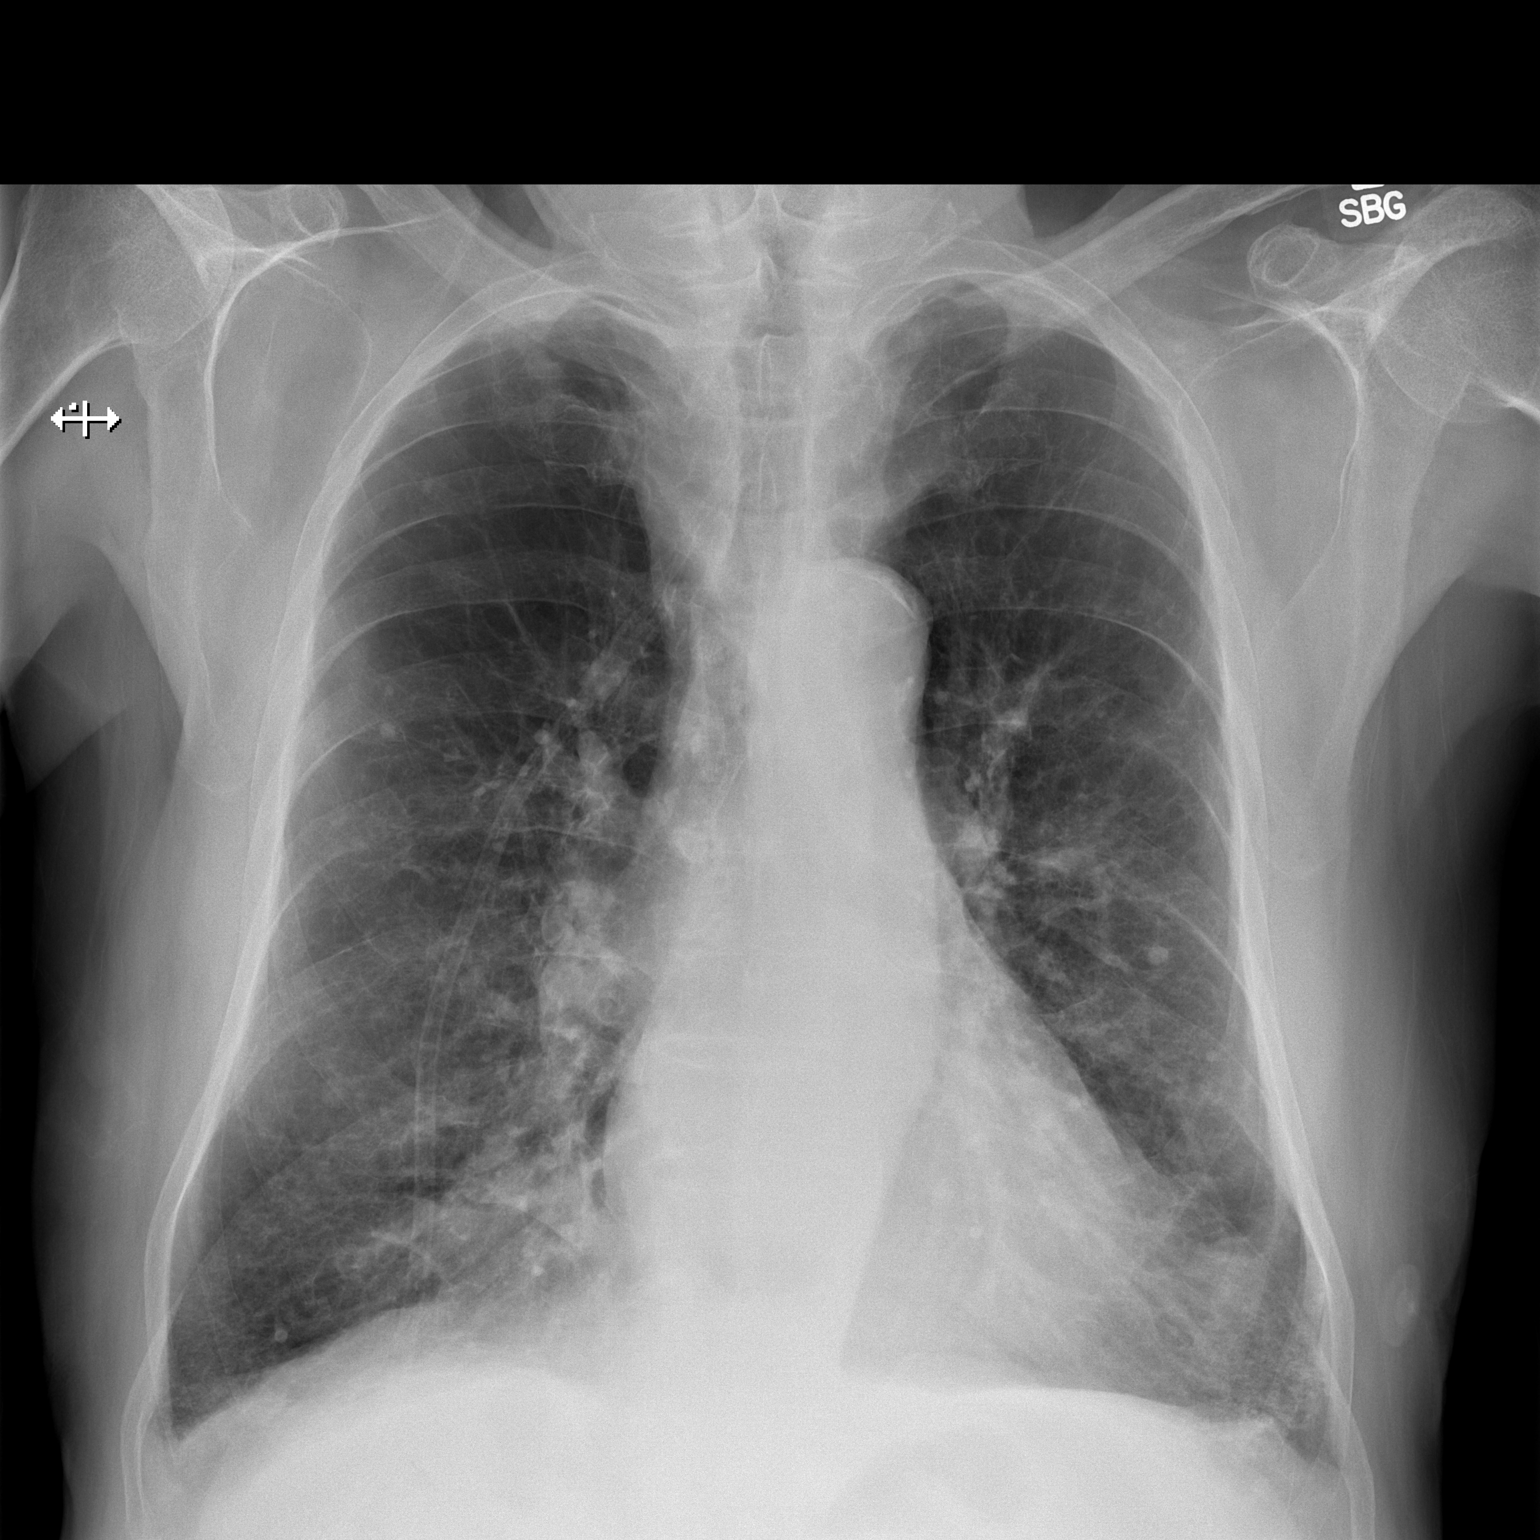

[w chest lat]
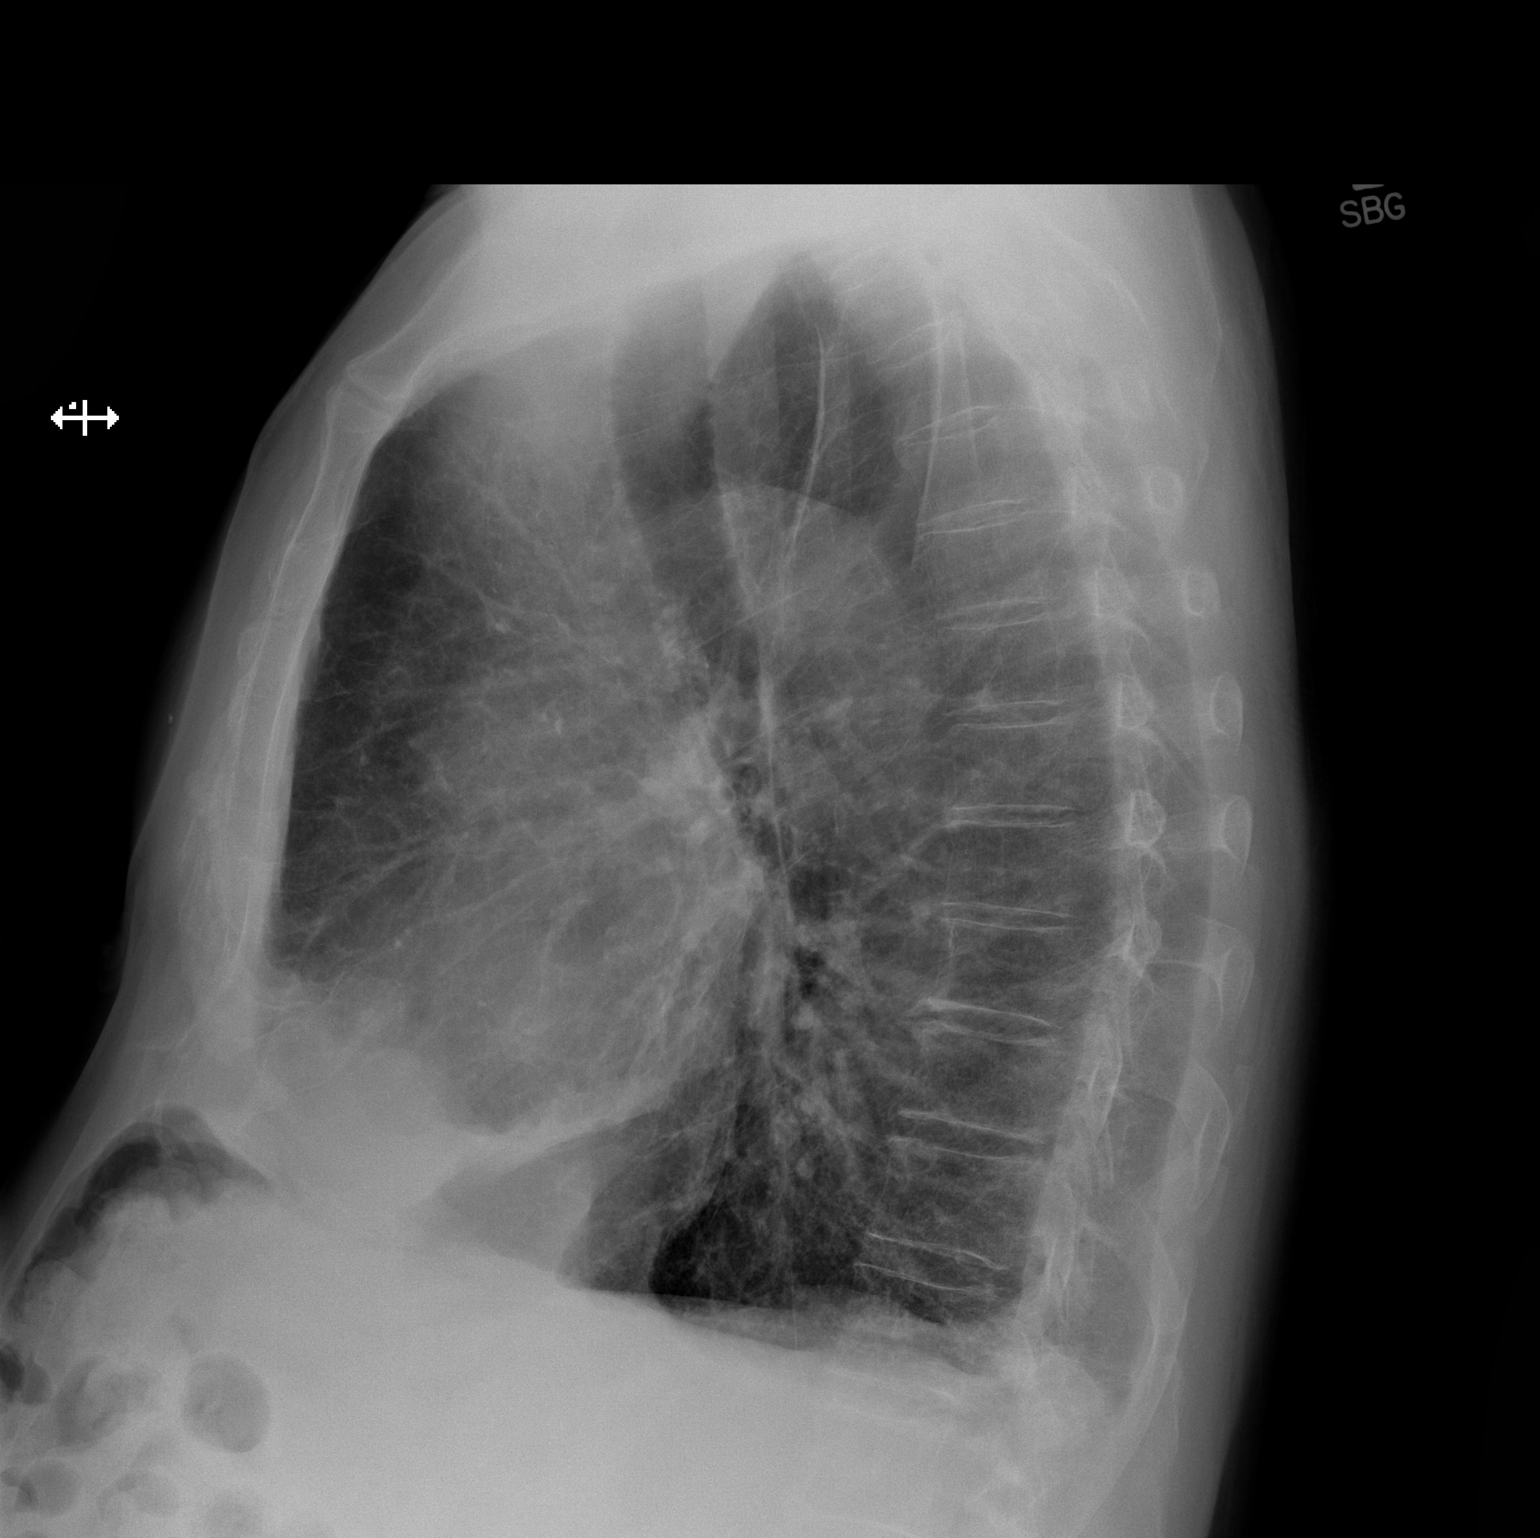

[2 of 2 positions shown; findings below may reference images not displayed]

FINDINGS: Cardiomediastinal silhouette is stable. Bilateral scarring and
chronic interstitial prominence again noted. Stable bilateral
calcified granulomas. No definite superimposed infiltrate or
pulmonary edema. Osteopenia and mild degenerative changes thoracic
spine.
IMPRESSION: Bilateral scarring and chronic interstitial prominence again noted.
Stable bilateral calcified granulomas. No definite superimposed
infiltrate or pulmonary edema.

## 2018-04-18 ENCOUNTER — Emergency Department (HOSPITAL_COMMUNITY): Payer: Medicare Other

## 2018-04-18 ENCOUNTER — Inpatient Hospital Stay (HOSPITAL_COMMUNITY)
Admission: EM | Admit: 2018-04-18 | Discharge: 2018-04-22 | DRG: 190 | Disposition: A | Discharge status: Skilled Nursing Facility | Payer: Medicare Other | Attending: Internal Medicine | Admitting: Internal Medicine

## 2018-04-18 ENCOUNTER — Ambulatory Visit (HOSPITAL_COMMUNITY): Payer: Medicare Other

## 2018-04-18 ENCOUNTER — Encounter (HOSPITAL_COMMUNITY): Payer: Self-pay | Admitting: Emergency Medicine

## 2018-04-18 DIAGNOSIS — R1310 Dysphagia, unspecified: Secondary | ICD-10-CM | POA: Diagnosis not present

## 2018-04-18 DIAGNOSIS — I10 Essential (primary) hypertension: Secondary | ICD-10-CM | POA: Diagnosis not present

## 2018-04-18 DIAGNOSIS — Z9981 Dependence on supplemental oxygen: Secondary | ICD-10-CM | POA: Diagnosis not present

## 2018-04-18 DIAGNOSIS — R05 Cough: Secondary | ICD-10-CM | POA: Diagnosis not present

## 2018-04-18 DIAGNOSIS — Z8249 Family history of ischemic heart disease and other diseases of the circulatory system: Secondary | ICD-10-CM

## 2018-04-18 DIAGNOSIS — R0602 Shortness of breath: Secondary | ICD-10-CM

## 2018-04-18 DIAGNOSIS — Z8546 Personal history of malignant neoplasm of prostate: Secondary | ICD-10-CM

## 2018-04-18 DIAGNOSIS — Z923 Personal history of irradiation: Secondary | ICD-10-CM | POA: Diagnosis not present

## 2018-04-18 DIAGNOSIS — I77810 Thoracic aortic ectasia: Secondary | ICD-10-CM

## 2018-04-18 DIAGNOSIS — R300 Dysuria: Secondary | ICD-10-CM | POA: Diagnosis not present

## 2018-04-18 DIAGNOSIS — I739 Peripheral vascular disease, unspecified: Secondary | ICD-10-CM | POA: Diagnosis not present

## 2018-04-18 DIAGNOSIS — E785 Hyperlipidemia, unspecified: Secondary | ICD-10-CM | POA: Diagnosis present

## 2018-04-18 DIAGNOSIS — R0603 Acute respiratory distress: Secondary | ICD-10-CM | POA: Diagnosis not present

## 2018-04-18 DIAGNOSIS — E46 Unspecified protein-calorie malnutrition: Secondary | ICD-10-CM | POA: Diagnosis not present

## 2018-04-18 DIAGNOSIS — K219 Gastro-esophageal reflux disease without esophagitis: Secondary | ICD-10-CM | POA: Diagnosis present

## 2018-04-18 DIAGNOSIS — Z7951 Long term (current) use of inhaled steroids: Secondary | ICD-10-CM

## 2018-04-18 DIAGNOSIS — R54 Age-related physical debility: Secondary | ICD-10-CM | POA: Diagnosis present

## 2018-04-18 DIAGNOSIS — Z66 Do not resuscitate: Secondary | ICD-10-CM | POA: Diagnosis present

## 2018-04-18 DIAGNOSIS — Z87891 Personal history of nicotine dependence: Secondary | ICD-10-CM | POA: Diagnosis not present

## 2018-04-18 DIAGNOSIS — I712 Thoracic aortic aneurysm, without rupture: Secondary | ICD-10-CM | POA: Diagnosis present

## 2018-04-18 DIAGNOSIS — R0682 Tachypnea, not elsewhere classified: Secondary | ICD-10-CM

## 2018-04-18 DIAGNOSIS — J189 Pneumonia, unspecified organism: Secondary | ICD-10-CM | POA: Diagnosis not present

## 2018-04-18 DIAGNOSIS — F0390 Unspecified dementia without behavioral disturbance: Secondary | ICD-10-CM | POA: Diagnosis not present

## 2018-04-18 DIAGNOSIS — J9621 Acute and chronic respiratory failure with hypoxia: Secondary | ICD-10-CM | POA: Diagnosis not present

## 2018-04-18 DIAGNOSIS — R079 Chest pain, unspecified: Secondary | ICD-10-CM | POA: Diagnosis not present

## 2018-04-18 DIAGNOSIS — J9691 Respiratory failure, unspecified with hypoxia: Secondary | ICD-10-CM | POA: Diagnosis not present

## 2018-04-18 DIAGNOSIS — N183 Chronic kidney disease, stage 3 (moderate): Secondary | ICD-10-CM | POA: Diagnosis present

## 2018-04-18 DIAGNOSIS — I129 Hypertensive chronic kidney disease with stage 1 through stage 4 chronic kidney disease, or unspecified chronic kidney disease: Secondary | ICD-10-CM | POA: Diagnosis present

## 2018-04-18 DIAGNOSIS — J310 Chronic rhinitis: Secondary | ICD-10-CM | POA: Diagnosis not present

## 2018-04-18 DIAGNOSIS — I480 Paroxysmal atrial fibrillation: Secondary | ICD-10-CM | POA: Diagnosis present

## 2018-04-18 DIAGNOSIS — J9611 Chronic respiratory failure with hypoxia: Secondary | ICD-10-CM | POA: Diagnosis not present

## 2018-04-18 DIAGNOSIS — Z7982 Long term (current) use of aspirin: Secondary | ICD-10-CM

## 2018-04-18 DIAGNOSIS — I471 Supraventricular tachycardia: Secondary | ICD-10-CM | POA: Diagnosis not present

## 2018-04-18 DIAGNOSIS — J441 Chronic obstructive pulmonary disease with (acute) exacerbation: Secondary | ICD-10-CM | POA: Diagnosis present

## 2018-04-18 DIAGNOSIS — R609 Edema, unspecified: Secondary | ICD-10-CM

## 2018-04-18 DIAGNOSIS — R0989 Other specified symptoms and signs involving the circulatory and respiratory systems: Secondary | ICD-10-CM

## 2018-04-18 DIAGNOSIS — N182 Chronic kidney disease, stage 2 (mild): Secondary | ICD-10-CM | POA: Diagnosis not present

## 2018-04-18 DIAGNOSIS — J449 Chronic obstructive pulmonary disease, unspecified: Secondary | ICD-10-CM | POA: Diagnosis present

## 2018-04-18 DIAGNOSIS — F419 Anxiety disorder, unspecified: Secondary | ICD-10-CM | POA: Diagnosis not present

## 2018-04-18 LAB — BASIC METABOLIC PANEL
Anion gap: 11 (ref 5–15)
BUN: 41 mg/dL — ABNORMAL HIGH (ref 8–23)
CO2: 27 mmol/L (ref 22–32)
Calcium: 9.6 mg/dL (ref 8.9–10.3)
Chloride: 107 mmol/L (ref 98–111)
Creatinine, Ser: 1.85 mg/dL — ABNORMAL HIGH (ref 0.61–1.24)
GFR calc Af Amer: 36 mL/min — ABNORMAL LOW (ref >60)
GFR calc non Af Amer: 31 mL/min — ABNORMAL LOW (ref >60)
GLUCOSE: 111 mg/dL — AB (ref 70–99)
POTASSIUM: 3.9 mmol/L (ref 3.5–5.1)
Sodium: 145 mmol/L (ref 135–145)

## 2018-04-18 LAB — CBC
HCT: 44.5 % (ref 39.0–52.0)
Hemoglobin: 14.4 g/dL (ref 13.0–17.0)
MCH: 31.5 pg (ref 26.0–34.0)
MCHC: 32.4 g/dL (ref 30.0–36.0)
MCV: 97.4 fL (ref 80.0–100.0)
PLATELETS: 341 10*3/uL (ref 150–400)
RBC: 4.57 MIL/uL (ref 4.22–5.81)
RDW: 13.8 % (ref 11.5–15.5)
WBC: 12.4 10*3/uL — ABNORMAL HIGH (ref 4.0–10.5)
nRBC: 0 % (ref 0.0–0.2)

## 2018-04-18 LAB — D-DIMER, QUANTITATIVE (NOT AT ARMC): D DIMER QUANT: 2.1 ug{FEU}/mL — AB (ref 0.00–0.50)

## 2018-04-18 LAB — I-STAT TROPONIN, ED: Troponin i, poc: 0.01 ng/mL (ref 0.00–0.08)

## 2018-04-18 LAB — INFLUENZA PANEL BY PCR (TYPE A & B)
INFLAPCR: NEGATIVE
Influenza B By PCR: NEGATIVE

## 2018-04-18 MED ORDER — ALPRAZOLAM 0.25 MG PO TABS
0.2500 mg | ORAL_TABLET | Freq: Three times a day (TID) | ORAL | Status: DC
  Administered 2018-04-18 – 2018-04-22 (×12): 0.25 mg via ORAL
  Filled 2018-04-18 (×12): qty 1

## 2018-04-18 MED ORDER — IOPAMIDOL (ISOVUE-370) INJECTION 76%
100.0000 mL | Freq: Once | INTRAVENOUS | Status: AC | PRN
Start: 2018-04-18 — End: 2018-04-18
  Administered 2018-04-18: 80 mL via INTRAVENOUS

## 2018-04-18 MED ORDER — ACETAMINOPHEN 325 MG PO TABS
650.0000 mg | ORAL_TABLET | Freq: Four times a day (QID) | ORAL | Status: DC | PRN
  Administered 2018-04-18: 650 mg via ORAL
  Filled 2018-04-18: qty 2

## 2018-04-18 MED ORDER — ENOXAPARIN SODIUM 30 MG/0.3ML ~~LOC~~ SOLN
30.0000 mg | SUBCUTANEOUS | Status: DC
  Administered 2018-04-19 – 2018-04-21 (×3): 30 mg via SUBCUTANEOUS
  Filled 2018-04-18 (×3): qty 0.3

## 2018-04-18 MED ORDER — ACETAMINOPHEN 650 MG RE SUPP: 650.0000 mg | Freq: Four times a day (QID) | RECTAL | Status: DC | PRN

## 2018-04-18 MED ORDER — ATORVASTATIN CALCIUM 40 MG PO TABS
40.0000 mg | ORAL_TABLET | ORAL | Status: DC
  Administered 2018-04-18 – 2018-04-22 (×3): 40 mg via ORAL
  Filled 2018-04-18 (×4): qty 1

## 2018-04-18 MED ORDER — METOPROLOL-HYDROCHLOROTHIAZIDE 50-25 MG PO TABS: 1.0000 | ORAL_TABLET | Freq: Every day | ORAL | Status: DC

## 2018-04-18 MED ORDER — ALBUTEROL SULFATE (2.5 MG/3ML) 0.083% IN NEBU: 2.5000 mg | INHALATION_SOLUTION | RESPIRATORY_TRACT | Status: DC | PRN

## 2018-04-18 MED ORDER — ENOXAPARIN SODIUM 40 MG/0.4ML ~~LOC~~ SOLN
40.0000 mg | SUBCUTANEOUS | Status: DC
  Administered 2018-04-18: 40 mg via SUBCUTANEOUS
  Filled 2018-04-18: qty 0.4

## 2018-04-18 MED ORDER — BENAZEPRIL HCL 40 MG PO TABS
40.0000 mg | ORAL_TABLET | Freq: Two times a day (BID) | ORAL | Status: DC
  Administered 2018-04-18 – 2018-04-22 (×8): 40 mg via ORAL
  Filled 2018-04-18 (×8): qty 1

## 2018-04-18 MED ORDER — SODIUM CHLORIDE 0.9% FLUSH
3.0000 mL | Freq: Once | INTRAVENOUS | Status: AC
  Administered 2018-04-18: 3 mL via INTRAVENOUS

## 2018-04-18 MED ORDER — ALBUTEROL SULFATE (2.5 MG/3ML) 0.083% IN NEBU
5.0000 mg | INHALATION_SOLUTION | Freq: Once | RESPIRATORY_TRACT | Status: AC
  Administered 2018-04-18: 5 mg via RESPIRATORY_TRACT
  Filled 2018-04-18: qty 6

## 2018-04-18 MED ORDER — BUDESONIDE 0.5 MG/2ML IN SUSP
0.5000 mg | Freq: Two times a day (BID) | RESPIRATORY_TRACT | Status: DC
  Administered 2018-04-18 – 2018-04-22 (×8): 0.5 mg via RESPIRATORY_TRACT
  Filled 2018-04-18 (×8): qty 2

## 2018-04-18 MED ORDER — RISAQUAD PO CAPS
1.0000 | ORAL_CAPSULE | Freq: Every day | ORAL | Status: DC
  Administered 2018-04-18 – 2018-04-22 (×5): 1 via ORAL
  Filled 2018-04-18 (×5): qty 1

## 2018-04-18 MED ORDER — GUAIFENESIN ER 600 MG PO TB12
600.0000 mg | ORAL_TABLET | Freq: Two times a day (BID) | ORAL | Status: DC
  Administered 2018-04-18 – 2018-04-22 (×8): 600 mg via ORAL
  Filled 2018-04-18 (×9): qty 1

## 2018-04-18 MED ORDER — METOPROLOL TARTRATE 50 MG PO TABS
50.0000 mg | ORAL_TABLET | Freq: Every day | ORAL | Status: DC
  Administered 2018-04-18 – 2018-04-22 (×5): 50 mg via ORAL
  Filled 2018-04-18 (×5): qty 1

## 2018-04-18 MED ORDER — HYDROCHLOROTHIAZIDE 25 MG PO TABS
25.0000 mg | ORAL_TABLET | Freq: Every day | ORAL | Status: DC
  Administered 2018-04-18 – 2018-04-20 (×3): 25 mg via ORAL
  Filled 2018-04-18 (×3): qty 1

## 2018-04-18 MED ORDER — DONEPEZIL HCL 10 MG PO TABS
10.0000 mg | ORAL_TABLET | Freq: Every day | ORAL | Status: DC
  Administered 2018-04-18 – 2018-04-21 (×4): 10 mg via ORAL
  Filled 2018-04-18 (×4): qty 1

## 2018-04-18 MED ORDER — FAMOTIDINE 20 MG PO TABS
20.0000 mg | ORAL_TABLET | Freq: Every day | ORAL | Status: DC
  Administered 2018-04-18 – 2018-04-21 (×4): 20 mg via ORAL
  Filled 2018-04-18 (×4): qty 1

## 2018-04-18 MED ORDER — ADULT MULTIVITAMIN W/MINERALS CH
1.0000 | ORAL_TABLET | Freq: Every day | ORAL | Status: DC
Start: 2018-04-18 — End: 2018-04-22
  Administered 2018-04-18 – 2018-04-22 (×5): 1 via ORAL
  Filled 2018-04-18 (×5): qty 1

## 2018-04-18 MED ORDER — IPRATROPIUM-ALBUTEROL 0.5-2.5 (3) MG/3ML IN SOLN
3.0000 mL | Freq: Four times a day (QID) | RESPIRATORY_TRACT | Status: DC
  Administered 2018-04-18 – 2018-04-19 (×2): 3 mL via RESPIRATORY_TRACT
  Filled 2018-04-18 (×2): qty 3

## 2018-04-18 MED ORDER — ASPIRIN EC 81 MG PO TBEC
81.0000 mg | DELAYED_RELEASE_TABLET | Freq: Every day | ORAL | Status: DC
  Administered 2018-04-18 – 2018-04-22 (×5): 81 mg via ORAL
  Filled 2018-04-18 (×5): qty 1

## 2018-04-18 MED ORDER — SODIUM CHLORIDE 0.9 % IV BOLUS
500.0000 mL | Freq: Once | INTRAVENOUS | Status: AC
  Administered 2018-04-18: 500 mL via INTRAVENOUS

## 2018-04-18 MED ORDER — IOPAMIDOL (ISOVUE-370) INJECTION 76%
INTRAVENOUS | Status: AC
  Filled 2018-04-18: qty 100

## 2018-04-18 MED ORDER — METHYLPREDNISOLONE SODIUM SUCC 125 MG IJ SOLR
125.0000 mg | Freq: Once | INTRAMUSCULAR | Status: AC
  Administered 2018-04-18: 125 mg via INTRAVENOUS
  Filled 2018-04-18: qty 2

## 2018-04-18 MED ORDER — PREDNISONE 20 MG PO TABS
40.0000 mg | ORAL_TABLET | Freq: Every day | ORAL | Status: DC
  Filled 2018-04-18: qty 2

## 2018-04-18 MED ORDER — SODIUM CHLORIDE 0.9 % IV SOLN
500.0000 mg | INTRAVENOUS | Status: AC
  Administered 2018-04-18: 500 mg via INTRAVENOUS
  Filled 2018-04-18: qty 500

## 2018-04-18 MED ORDER — AZITHROMYCIN 500 MG PO TABS
500.0000 mg | ORAL_TABLET | Freq: Every day | ORAL | Status: AC
  Administered 2018-04-19 – 2018-04-22 (×4): 500 mg via ORAL
  Filled 2018-04-18 (×4): qty 1

## 2018-04-18 NOTE — ED Notes (Addendum)
Patient transported to CT 

## 2018-04-18 NOTE — Progress Notes (Signed)
LAKOTA MARKGRAF [id number] Admission Data: 04/18/2018 8:13 PM Attending Provider: Barb Merino, MD  KPT:[id number], Mauro Kaufmann, MD Consults/ Treatment Team:   Trevor Pitts is a 85 y.o. male patient admitted from ED awake, alert  & orientated  X 3,  Full Code, VSS - Blood pressure 133/87, pulse (!) 113, temperature 98.5 F (36.9 C), temperature source Oral, resp. rate 20, height 5\' 7"  (1.702 m), weight 70.1 kg, SpO2 96 %., O2  2 L nasal cannular, no c/o shortness of breath, no c/o chest pain, no distress noted. Tele # 17 placed and pt is currently running:normal sinus rhythm.   IV site WDL:  antecubital left, condition patent and no redness with a transparent dsg that's clean dry and intact.  Allergies:   Allergies  Allergen Reactions  . Sulfa Antibiotics Other (See Comments)    unspecified reaction     Past Medical History:  Diagnosis Date  . Asthma   . Atrial fibrillation ([place])   . Cancer [facility])    prostate - sp radiation w/o recurrence  . CKD (chronic kidney disease) stage 2, GFR 60-89 ml/min 07/20/2016  . COPD (chronic obstructive pulmonary disease) ([place])   . GERD (gastroesophageal reflux disease)   . H/O hiatal hernia   . Hernia of abdominal cavity   . Hypertension   . Pneumonia   . Recurrent upper respiratory infection (URI)   . Shortness of breath     History:  obtained from the patient. Tobacco/alcohol: denied none  Pt orientation to unit, room and routine. Information packet given to patient/family and safety video watched.  Admission INP armband ID verified with patient/family, and in place. SR up x 2, fall risk assessment complete with Patient and family verbalizing understanding of risks associated with falls. Pt verbalizes an understanding of how to use the call bell and to call for help before getting out of bed.  Skin, clean-dry- intact without evidence of bruising, or skin tears.   No evidence of skin break down noted on exam. no rashes, no ecchymoses, no  petechiae, no nodules, no jaundice, no purpura, no wounds    Will cont to monitor and assist as needed.  Tresa Endo, RN 04/18/2018 8:13 PM

## 2018-04-18 NOTE — ED Notes (Signed)
Attempted report 

## 2018-04-18 NOTE — ED Triage Notes (Signed)
Pt arrives by pov from home with c/o of sob for the last 2 days- pt reports productive cough. Pt has COPD wears 2L [place].

## 2018-04-18 NOTE — ED Notes (Signed)
ED TO INPATIENT HANDOFF REPORT  ED Nurse Name and Phone #: Davene Costain [phone]  Name/Age/Gender Trevor Pitts 85 y.o. male Room/Bed: 028C/028C  Code Status   Code Status: Prior  Home/SNF/Other Home Patient oriented to: self, place, time and situation Is this baseline? Yes   Triage Complete: Triage complete   Chief Complaint copd diff breathing   Triage Note Pt arrives by pov from home with c/o of sob for the last 2 days- pt reports productive cough. Pt has COPD wears 2L Jack.      Allergies Allergies  Allergen Reactions  . Sulfa Antibiotics Other (See Comments)    unspecified reaction    Level of Care/Admitting Diagnosis ED Disposition    ED Disposition Condition [facility]: [place] [[id number]]  Level of Care: Med-Surg [16]  Diagnosis: COPD with acute exacerbation [facility]) [[id number]]  Admitting Physician: Barb Merino [[id number]]  Attending Physician: Barb Merino [[id number]]  Estimated length of stay: past midnight tomorrow  Certification:: I certify this patient will need inpatient services for at least 2 midnights  PT Class (Do Not Modify): Inpatient [101]  PT Acc Code (Do Not Modify): Private [1]       Medical/Surgery History Past Medical History:  Diagnosis Date  . Asthma   . Atrial fibrillation ([place])   . Cancer [facility])    prostate - sp radiation w/o recurrence  . CKD (chronic kidney disease) stage 2, GFR 60-89 ml/min 07/20/2016  . COPD (chronic obstructive pulmonary disease) ([place])   . GERD (gastroesophageal reflux disease)   . H/O hiatal hernia   . Hernia of abdominal cavity   . Hypertension   . Pneumonia   . Recurrent upper respiratory infection (URI)   . Shortness of breath    Past Surgical History:  Procedure Laterality Date  . COLON SURGERY       IV Location/Drains/Wounds Patient Lines/Drains/Airways Status   Active Line/Drains/Airways    Name:   Placement date:   Placement time:   Site:   Days:    Peripheral IV 04/18/18 Left Antecubital   04/18/18    0844    Antecubital   less than 1          Intake/Output Last 24 hours  Intake/Output Summary (Last 24 hours) at 04/18/2018 1425 Last data filed at 04/18/2018 1217 Gross per 24 hour  Intake 500.31 ml  Output -  Net 500.31 ml    Labs/Imaging Results for orders placed or performed during the hospital encounter of 04/18/18 (from the past 48 hour(s))  D-dimer, quantitative     Status: Abnormal   Collection Time: 04/18/18  8:36 AM  Result Value Ref Range   D-Dimer, Quant 2.10 (H) 0.00 - 0.50 ug/mL-FEU    Comment: (NOTE) At the manufacturer cut-off of 0.50 ug/mL FEU, this assay has been documented to exclude PE with a sensitivity and negative predictive value of 97 to 99%.  At this time, this assay has not been approved by the FDA to exclude DVT/VTE. Results should be correlated with clinical presentation. Performed at [facility], [place] [address] [place], [place] [address]   Basic metabolic panel     Status: Abnormal   Collection Time: 04/18/18  8:45 AM  Result Value Ref Range   Sodium 145 135 - 145 mmol/L   Potassium 3.9 3.5 - 5.1 mmol/L   Chloride 107 98 - 111 mmol/L   CO2 27 22 - 32 mmol/L   Glucose, Bld 111 (H)  70 - 99 mg/dL   BUN 41 (H) 8 - 23 mg/dL   Creatinine, Ser 1.85 (H) 0.61 - 1.24 mg/dL   Calcium 9.6 8.9 - 10.3 mg/dL   GFR calc non Af Amer 31 (L) >60 mL/min   GFR calc Af Amer 36 (L) >60 mL/min   Anion gap 11 5 - 15    Comment: Performed at [facility] [address], [place], [place] [address]  CBC     Status: Abnormal   Collection Time: 04/18/18  8:45 AM  Result Value Ref Range   WBC 12.4 (H) 4.0 - 10.5 K/uL   RBC 4.57 4.22 - 5.81 MIL/uL   Hemoglobin 14.4 13.0 - 17.0 g/dL   HCT 44.5 39.0 - 52.0 %   MCV 97.4 80.0 - 100.0 fL   MCH 31.5 26.0 - 34.0 pg   MCHC 32.4 30.0 - 36.0 g/dL   RDW 13.8 11.5 - 15.5 %   Platelets 341 150 - 400 K/uL   nRBC 0.0 0.0 - 0.2 %    Comment: Performed at [facility], [place] [address] [place], [place] [address]  I-stat troponin, ED     Status: None   Collection Time: 04/18/18  8:49 AM  Result Value Ref Range   Troponin i, poc 0.01 0.00 - 0.08 ng/mL   Comment 3            Comment: Due to the release kinetics of cTnI, a negative result within the first hours of the onset of symptoms does not rule out myocardial infarction with certainty. If myocardial infarction is still suspected, repeat the test at appropriate intervals.   Influenza panel by PCR (type A & B)     Status: None   Collection Time: 04/18/18 11:53 AM  Result Value Ref Range   Influenza A By PCR NEGATIVE NEGATIVE   Influenza B By PCR NEGATIVE NEGATIVE    Comment: (NOTE) The Xpert Xpress Flu assay is intended as an aid in the diagnosis of  influenza and should not be used as a sole basis for treatment.  This  assay is FDA approved for nasopharyngeal swab specimens only. Nasal  washings and aspirates are unacceptable for Xpert Xpress Flu testing. Performed at [facility], [place] [address], [place],  [address]    Dg Chest 2 View  Result Date: 04/18/2018 CLINICAL DATA:  85 year old with chest pain. Weakness and shortness of breath. EXAM: CHEST - 2 VIEW COMPARISON:  03/22/2018 FINDINGS: Chronic hyperinflation and findings are suggestive for emphysematous disease. Cardiomediastinal silhouette is stable. Again noted are densities in the right lower chest but concern for acute on chronic disease. No other evidence for focal new lung densities. No large pleural effusions. Atherosclerotic calcifications at the aortic arch. IMPRESSION: Densities in the right lower chest are concerning for acute on chronic disease. Cannot exclude infectious etiology at the right lung base. Stable hyperinflation and emphysematous disease. Electronically Signed   By: Markus Daft M.D.   On: 04/18/2018 09:43   Ct Angio Chest Pe W/cm &/or Wo Cm  Result Date: 04/18/2018 CLINICAL DATA:  Shortness of  breath and cough EXAM: CT ANGIOGRAPHY CHEST WITH CONTRAST TECHNIQUE: Multidetector CT imaging of the chest was performed using the standard protocol during bolus administration of intravenous contrast. Multiplanar CT image reconstructions and MIPs were obtained to evaluate the vascular anatomy. CONTRAST:  42mL ISOVUE-370 IOPAMIDOL (ISOVUE-370) INJECTION 76% COMPARISON:  Chest radiograph April 18, 2018 FINDINGS: Cardiovascular: There is no  demonstrable pulmonary embolus. The ascending thoracic aorta has a maximum transverse diameter of 4.2 x 4.2 cm. There is no demonstrable aortic dissection. There are foci atherosclerotic calcification in the proximal visualized great vessels. There is aortic atherosclerosis with irregular plaque throughout the proximal to mid descending thoracic aorta. There are foci of coronary artery calcification. There is no pericardial effusion or pericardial thickening evident. There is left ventricular hypertrophy. Mediastinum/Nodes: No thyroid lesions are evident. There is no appreciable thoracic adenopathy. Several lymph nodes show evident calcification consistent with prior granulomatous disease. There is a small hiatal hernia. Lungs/Pleura: There is underlying bullous emphysematous change. There are scattered calcified granulomas. There is localized scarring with bronchiectatic change in the left upper lobe. There is atelectatic change in the lung bases. There is an area of scarring with mild bronchiectatic change in the left lower lobe. There is no frank edema or consolidation. There is no appreciable pleural effusion. There is epicardial fat prominence along the left and right heart borders. Upper Abdomen: There are calcified splenic granulomas. There is upper abdominal aortic atherosclerosis. Visualized upper abdominal structures otherwise appear unremarkable. Musculoskeletal: There are no blastic or lytic bone lesions. There is no intramuscular lesion. There is degenerative change  in each shoulder, somewhat more severe on the left than on the right. Review of the MIP images confirms the above findings. IMPRESSION: 1.  No demonstrable pulmonary embolus. 2. Prominence of the ascending thoracic aorta with a maximum transverse diameter 4.2 x 4.2 cm. No evident dissection. Extensive aortic atherosclerosis with irregular plaque throughout the proximal to mid descending thoracic aorta noted. Foci of coronary artery calcification and great vessel calcification noted. Recommend semi-annual imaging followup by CTA or MRA and referral to cardiothoracic surgery if not already obtained. This recommendation follows 2010 ACCF/AHA/AATS/ACR/ASA/SCA/SCAI/SIR/STS/SVM Guidelines for the Diagnosis and Management of Patients With Thoracic Aortic Disease. Circulation. 2010; 121: K553-Z48. Aortic aneurysm NOS (ICD10-I71.9). 3.  There is left ventricular hypertrophy. 4. Extensive emphysematous change. Areas of cicatrization with associated bronchiectatic change in the left upper and left lower lobe regions. Scattered areas of atelectasis. No frank edema consolidation evident. 5. Evidence of prior granulomatous disease with calcified parenchymal lung granulomas as well as several calcified lymph nodes and splenic calcified granulomas. 6.  No adenopathy by size criteria. 7.  Fairly small hiatal hernia. Aortic Atherosclerosis (ICD10-I70.0) and Emphysema (ICD10-J43.9). Electronically Signed   By: Lowella Grip III M.D.   On: 04/18/2018 10:41   Vas [place] Lower Extremity Venous (dvt) (only Mc & Wl 7a-7p)  Result Date: 04/18/2018  Lower Venous Study Indications: Edema, SOB, and cough.  Comparison Study: No prior study on file Performing Technologist: Sharion Dove RVS  Examination Guidelines: A complete evaluation includes B-mode imaging, spectral Doppler, color Doppler, and power Doppler as needed of all accessible portions of each vessel. Bilateral testing is considered an integral part of a complete examination.  Limited examinations for reoccurring indications may be performed as noted.  Right Venous Findings: +---------+---------------+---------+-----------+----------+-------+          CompressibilityPhasicitySpontaneityPropertiesSummary +---------+---------------+---------+-----------+----------+-------+ CFV      Full           Yes      Yes                          +---------+---------------+---------+-----------+----------+-------+ SFJ      Full                                                 +---------+---------------+---------+-----------+----------+-------+  FV Prox  Full                                                 +---------+---------------+---------+-----------+----------+-------+ FV Mid   Full                                                 +---------+---------------+---------+-----------+----------+-------+ FV DistalFull                                                 +---------+---------------+---------+-----------+----------+-------+ PFV      Full                                                 +---------+---------------+---------+-----------+----------+-------+ POP      Full           Yes      Yes                          +---------+---------------+---------+-----------+----------+-------+ PTV      Full                                                 +---------+---------------+---------+-----------+----------+-------+ PERO     Full                                                 +---------+---------------+---------+-----------+----------+-------+  Left Venous Findings: +---+---------------+---------+-----------+----------+-------+    CompressibilityPhasicitySpontaneityPropertiesSummary +---+---------------+---------+-----------+----------+-------+ CFVFull           Yes      Yes                          +---+---------------+---------+-----------+----------+-------+    Summary: Right: There is no evidence of deep vein  thrombosis in the lower extremity.  *See table(s) above for measurements and observations.    Preliminary     Pending Labs [employer] (From admission, onward)    Start     Ordered   Signed and Occupational hygienist morning,   R     Signed and Held   Signed and Held  CBC  Tomorrow morning,   R     Signed and Held          Vitals/Pain Today's Vitals   04/18/18 1307 04/18/18 1308 04/18/18 1345 04/18/18 1400  BP:  119/75 121/81 121/75  Pulse:  (!) 121 (!) 107 (!) 113  Resp:  (!) 24 (!) 23 (!) 23  Temp:      TempSrc:      SpO2:  94% 95% 95%  Weight:      Height:      PainSc: 0-No  pain       Isolation Precautions No active isolations  Medications Medications  iopamidol (ISOVUE-370) 76 % injection (has no administration in time range)  sodium chloride flush (NS) 0.9 % injection 3 mL (3 mLs Intravenous Given 04/18/18 0904)  methylPREDNISolone sodium succinate (SOLU-MEDROL) 125 mg/2 mL injection 125 mg (125 mg Intravenous Given 04/18/18 1111)  albuterol (PROVENTIL) (2.5 MG/3ML) 0.083% nebulizer solution 5 mg (5 mg Nebulization Given 04/18/18 1058)  sodium chloride 0.9 % bolus 500 mL (0 mLs Intravenous Stopped 04/18/18 1217)  iopamidol (ISOVUE-370) 76 % injection 100 mL (80 mLs Intravenous Contrast Given 04/18/18 1021)    Mobility walks with device     Focused Assessments Pulmonary Assessment Handoff:  Lung sounds: Bilateral Breath Sounds: Expiratory wheezes O2 Device: Nasal Cannula O2 Flow Rate (L/min): 2 L/min      Recommendations: See Admitting Provider Note  Report given to:   Additional Notes: Pt walks with device but not has none with him.

## 2018-04-18 NOTE — Progress Notes (Signed)
VASCULAR LAB PRELIMINARY  PRELIMINARY  PRELIMINARY  PRELIMINARY  Right lower extremity venous duplex completed.    Preliminary report:  See CV Proc  Called results to Dr. Loreen Freud, [facility], RVT 04/18/2018, 10:49 AM

## 2018-04-18 NOTE — ED Notes (Signed)
Patient transported to X-ray 

## 2018-04-18 NOTE — H&P (Signed)
History and Physical    Trevor Pitts:[id number] DOB: 07-Jan-1925 DOA: 04/18/2018  PCP: Merrilee Seashore, MD  Patient coming from: Home.  I have personally briefly reviewed patient's old medical records available.   Chief Complaint: Shortness of breath and wheezing.  HPI: Trevor Pitts is a 85 y.o. male with medical history significant of COPD, chronic hypoxic respiratory failure on 2 L of oxygen at home, hypertension, atrial fibrillation, chronic kidney disease stage III who presents to the emergency room with shortness of breath for the last few days.  According to the patient, he does have COPD and uses nebulizers at home with fairly controlled symptoms.  Since last 2 days he has been having worsening cough and increased mucus production.  Denies any fever or chills.  Wheezing.  No sick contacts.  Denies any chest pain.  No leg swelling.  He feels like he is not able to expectorate the cough.  Tried some nebulizer at home with transient help and will have recurrent symptoms so came to the ER. ED Course: Hemodynamically stable.  Slightly tachypneic and tachycardic.  WBC count 12.4.  Creatinine 1.85.  On 3 L of oxygen.  Chest x-ray shows emphysematous disease.  Duplex of the lower extremities were negative for DVT.  CT scan of the chest was done, negative for PE, calcifications of aorta, 4.2 x 4.2 cm ascending aortic aneurysm without rupture, advanced COPD changes.  Patient was given nebulizers and steroids in the ER with some improvement.  He has persistent wheezing and shortness of breath needing hospitalization.  Review of Systems: As per HPI otherwise 10 point review of systems negative.    Past Medical History:  Diagnosis Date  . Asthma   . Atrial fibrillation ([place])   . Cancer [facility])    prostate - sp radiation w/o recurrence  . CKD (chronic kidney disease) stage 2, GFR 60-89 ml/min 07/20/2016  . COPD (chronic obstructive pulmonary disease) ([place])   . GERD (gastroesophageal  reflux disease)   . H/O hiatal hernia   . Hernia of abdominal cavity   . Hypertension   . Pneumonia   . Recurrent upper respiratory infection (URI)   . Shortness of breath     Past Surgical History:  Procedure Laterality Date  . COLON SURGERY       reports that he has quit smoking. His smoking use included cigarettes, pipe, and cigars. He has a 12.50 pack-year smoking history. He has never used smokeless tobacco. He reports that he does not drink alcohol or use drugs.  Allergies  Allergen Reactions  . Sulfa Antibiotics Other (See Comments)    unspecified reaction    Family History  Problem Relation Age of Onset  . Heart attack Father      Prior to Admission medications   Medication Sig Start Date End Date Taking? Authorizing Provider  ALPRAZolam (XANAX) 0.25 MG tablet Take 0.25 mg by mouth 3 (three) times daily.   Yes [provider]  arformoterol (BROVANA) 15 MCG/2ML NEBU Take 15 mcg by nebulization 2 (two) times daily.   Yes [provider]  aspirin 81 MG tablet Take 81 mg by mouth daily.    Yes [provider]  atorvastatin (LIPITOR) 80 MG tablet Take 40 mg by mouth every other day.    Yes [provider]  benazepril (LOTENSIN) 40 MG tablet Take 40 mg by mouth 2 (two) times daily.    Yes [provider]  budesonide (PULMICORT) 0.5 MG/2ML nebulizer solution Take 0.5  mg by nebulization 2 (two) times daily.   Yes [provider]  donepezil (ARICEPT) 10 MG tablet Take 10 mg by mouth at bedtime. 02/11/18  Yes [provider]  famotidine (PEPCID) 20 MG tablet Take 20 mg by mouth at bedtime.    Yes [provider]  metoprolol-hydrochlorothiazide (LOPRESSOR HCT) 50-25 MG tablet Take 1 tablet by mouth daily.   Yes [provider]  Multiple Vitamin (MULTIVITAMIN) capsule Take 1 capsule by mouth daily.   Yes [provider]  Multiple Vitamins-Minerals (ICAPS MV) TABS Take 1 tablet by mouth daily.     Yes [provider]  OXYGEN-HELIUM IN Pt uses o2 2lpm with exertion as needed- [employer]   Yes [provider]  Probiotic CAPS Take 1 capsule by mouth daily.   Yes [provider]  Tiotropium Bromide Monohydrate (SPIRIVA RESPIMAT IN) Inhale 2 puffs into the lungs daily.   Yes [provider]    Physical Exam: Vitals:   04/18/18 1130 04/18/18 1152 04/18/18 1215 04/18/18 1308  BP: (!) 124/51  131/72 119/75  Pulse: (!) 121  (!) 112 (!) 121  Resp: (!) 23  (!) 26 (!) 24  Temp:  98 F (36.7 C)    TempSrc:  Oral    SpO2: 95%  95% 94%  Weight:      Height:        Constitutional: NAD, calm, comfortable Vitals:   04/18/18 1130 04/18/18 1152 04/18/18 1215 04/18/18 1308  BP: (!) 124/51  131/72 119/75  Pulse: (!) 121  (!) 112 (!) 121  Resp: (!) 23  (!) 26 (!) 24  Temp:  98 F (36.7 C)    TempSrc:  Oral    SpO2: 95%  95% 94%  Weight:      Height:       Eyes: PERRL, lids and conjunctivae normal ENMT: Mucous membranes are moist. Posterior pharynx clear of any exudate or lesions.Normal dentition.  Neck: normal, supple, no masses, no thyromegaly Respiratory: Bilateral expiratory wheezes and conducted airway sounds.  Normal respiratory effort. No accessory muscle use.  Cardiovascular: Regular rate and rhythm, no murmurs / rubs / gallops. No extremity edema. 2+ pedal pulses. No carotid bruits.  Abdomen: no tenderness, no masses palpated. No hepatosplenomegaly. Bowel sounds positive.  Musculoskeletal: no clubbing / cyanosis. No joint deformity upper and lower extremities. Good ROM, no contractures. Normal muscle tone.  Skin: no rashes, lesions, ulcers. No induration Neurologic: CN 2-12 grossly intact. Sensation intact, DTR normal. Strength 5/5 in all 4.  Psychiatric: Normal judgment and insight. Alert and oriented x 3. Normal mood.     Labs on Admission: I have personally reviewed following labs and imaging studies  CBC: Recent Labs  Lab 04/18/18 0845    WBC 12.4*  HGB 14.4  HCT 44.5  MCV 97.4  PLT 762   Basic Metabolic Panel: Recent Labs  Lab 04/18/18 0845  NA 145  K 3.9  CL 107  CO2 27  GLUCOSE 111*  BUN 41*  CREATININE 1.85*  CALCIUM 9.6   GFR: Estimated Creatinine Clearance: 25.2 mL/min (A) (by C-G formula based on SCr of 1.85 mg/dL (H)). Liver Function Tests: No results for input(s): AST, ALT, ALKPHOS, BILITOT, PROT, ALBUMIN in the last 168 hours. No results for input(s): LIPASE, AMYLASE in the last 168 hours. No results for input(s): AMMONIA in the last 168 hours. Coagulation Profile: No results for input(s): INR, PROTIME in the last 168 hours. Cardiac Enzymes: No results for input(s): CKTOTAL, CKMB,  CKMBINDEX, TROPONINI in the last 168 hours. BNP (last 3 results) No results for input(s): PROBNP in the last 8760 hours. HbA1C: No results for input(s): HGBA1C in the last 72 hours. CBG: No results for input(s): GLUCAP in the last 168 hours. Lipid Profile: No results for input(s): CHOL, HDL, LDLCALC, TRIG, CHOLHDL, LDLDIRECT in the last 72 hours. Thyroid Function Tests: No results for input(s): TSH, T4TOTAL, FREET4, T3FREE, THYROIDAB in the last 72 hours. Anemia Panel: No results for input(s): VITAMINB12, FOLATE, FERRITIN, TIBC, IRON, RETICCTPCT in the last 72 hours. Urine analysis:    Component Value Date/Time   COLORURINE YELLOW 06/30/2015 [place] 06/30/2015 1227   LABSPEC 1.020 06/30/2015 1227   LABSPEC 1.020 10/16/2010 1539   PHURINE 6.0 06/30/2015 1227   GLUCOSEU NEGATIVE 06/30/2015 1227   GLUCOSEU NEGATIVE 12/14/2013 1224   HGBUR NEGATIVE 06/30/2015 1227   BILIRUBINUR NEGATIVE 06/30/2015 1227   BILIRUBINUR Negative 10/16/2010 [place] 06/30/2015 1227   PROTEINUR NEGATIVE 06/30/2015 1227   UROBILINOGEN 0.2 12/14/2013 1224   NITRITE NEGATIVE 06/30/2015 1227   LEUKOCYTESUR NEGATIVE 06/30/2015 1227   LEUKOCYTESUR Trace 10/16/2010 1539    Radiological Exams on  Admission: Dg Chest 2 View  Result Date: 04/18/2018 CLINICAL DATA:  85 year old with chest pain. Weakness and shortness of breath. EXAM: CHEST - 2 VIEW COMPARISON:  03/22/2018 FINDINGS: Chronic hyperinflation and findings are suggestive for emphysematous disease. Cardiomediastinal silhouette is stable. Again noted are densities in the right lower chest but concern for acute on chronic disease. No other evidence for focal new lung densities. No large pleural effusions. Atherosclerotic calcifications at the aortic arch. IMPRESSION: Densities in the right lower chest are concerning for acute on chronic disease. Cannot exclude infectious etiology at the right lung base. Stable hyperinflation and emphysematous disease. Electronically Signed   By: Markus Daft M.D.   On: 04/18/2018 09:43   Ct Angio Chest Pe W/cm &/or Wo Cm  Result Date: 04/18/2018 CLINICAL DATA:  Shortness of breath and cough EXAM: CT ANGIOGRAPHY CHEST WITH CONTRAST TECHNIQUE: Multidetector CT imaging of the chest was performed using the standard protocol during bolus administration of intravenous contrast. Multiplanar CT image reconstructions and MIPs were obtained to evaluate the vascular anatomy. CONTRAST:  62mL ISOVUE-370 IOPAMIDOL (ISOVUE-370) INJECTION 76% COMPARISON:  Chest radiograph April 18, 2018 FINDINGS: Cardiovascular: There is no demonstrable pulmonary embolus. The ascending thoracic aorta has a maximum transverse diameter of 4.2 x 4.2 cm. There is no demonstrable aortic dissection. There are foci atherosclerotic calcification in the proximal visualized great vessels. There is aortic atherosclerosis with irregular plaque throughout the proximal to mid descending thoracic aorta. There are foci of coronary artery calcification. There is no pericardial effusion or pericardial thickening evident. There is left ventricular hypertrophy. Mediastinum/Nodes: No thyroid lesions are evident. There is no appreciable thoracic adenopathy. Several  lymph nodes show evident calcification consistent with prior granulomatous disease. There is a small hiatal hernia. Lungs/Pleura: There is underlying bullous emphysematous change. There are scattered calcified granulomas. There is localized scarring with bronchiectatic change in the left upper lobe. There is atelectatic change in the lung bases. There is an area of scarring with mild bronchiectatic change in the left lower lobe. There is no frank edema or consolidation. There is no appreciable pleural effusion. There is epicardial fat prominence along the left and right heart borders. Upper Abdomen: There are calcified splenic granulomas. There is upper abdominal aortic atherosclerosis. Visualized upper abdominal structures otherwise appear unremarkable. Musculoskeletal: There are no  blastic or lytic bone lesions. There is no intramuscular lesion. There is degenerative change in each shoulder, somewhat more severe on the left than on the right. Review of the MIP images confirms the above findings. IMPRESSION: 1.  No demonstrable pulmonary embolus. 2. Prominence of the ascending thoracic aorta with a maximum transverse diameter 4.2 x 4.2 cm. No evident dissection. Extensive aortic atherosclerosis with irregular plaque throughout the proximal to mid descending thoracic aorta noted. Foci of coronary artery calcification and great vessel calcification noted. Recommend semi-annual imaging followup by CTA or MRA and referral to cardiothoracic surgery if not already obtained. This recommendation follows 2010 ACCF/AHA/AATS/ACR/ASA/SCA/SCAI/SIR/STS/SVM Guidelines for the Diagnosis and Management of Patients With Thoracic Aortic Disease. Circulation. 2010; 121: Y774-J28. Aortic aneurysm NOS (ICD10-I71.9). 3.  There is left ventricular hypertrophy. 4. Extensive emphysematous change. Areas of cicatrization with associated bronchiectatic change in the left upper and left lower lobe regions. Scattered areas of atelectasis. No  frank edema consolidation evident. 5. Evidence of prior granulomatous disease with calcified parenchymal lung granulomas as well as several calcified lymph nodes and splenic calcified granulomas. 6.  No adenopathy by size criteria. 7.  Fairly small hiatal hernia. Aortic Atherosclerosis (ICD10-I70.0) and Emphysema (ICD10-J43.9). Electronically Signed   By: Lowella Grip III M.D.   On: 04/18/2018 10:41   Vas [place] Lower Extremity Venous (dvt) (only Mc & Wl 7a-7p)  Result Date: 04/18/2018  Lower Venous Study Indications: Edema, SOB, and cough.  Comparison Study: No prior study on file Performing Technologist: Sharion Dove RVS  Examination Guidelines: A complete evaluation includes B-mode imaging, spectral Doppler, color Doppler, and power Doppler as needed of all accessible portions of each vessel. Bilateral testing is considered an integral part of a complete examination. Limited examinations for reoccurring indications may be performed as noted.  Right Venous Findings: +---------+---------------+---------+-----------+----------+-------+          CompressibilityPhasicitySpontaneityPropertiesSummary +---------+---------------+---------+-----------+----------+-------+ CFV      Full           Yes      Yes                          +---------+---------------+---------+-----------+----------+-------+ SFJ      Full                                                 +---------+---------------+---------+-----------+----------+-------+ FV Prox  Full                                                 +---------+---------------+---------+-----------+----------+-------+ FV Mid   Full                                                 +---------+---------------+---------+-----------+----------+-------+ FV DistalFull                                                 +---------+---------------+---------+-----------+----------+-------+ PFV      Full                                                  +---------+---------------+---------+-----------+----------+-------+  POP      Full           Yes      Yes                          +---------+---------------+---------+-----------+----------+-------+ PTV      Full                                                 +---------+---------------+---------+-----------+----------+-------+ PERO     Full                                                 +---------+---------------+---------+-----------+----------+-------+  Left Venous Findings: +---+---------------+---------+-----------+----------+-------+    CompressibilityPhasicitySpontaneityPropertiesSummary +---+---------------+---------+-----------+----------+-------+ CFVFull           Yes      Yes                          +---+---------------+---------+-----------+----------+-------+    Summary: Right: There is no evidence of deep vein thrombosis in the lower extremity.  *See table(s) above for measurements and observations.    Preliminary     EKG: Independently reviewed.  Sinus tachycardia.  Plenty of PVCs.  No change from previous as compared from more than a year ago.  Assessment/Plan Principal Problem:   COPD with acute exacerbation (HCC) Active Problems:   HLD (hyperlipidemia)   Benign essential HTN   COPD GOLD III   Chronic respiratory failure with hypoxia (HCC)   PAF (paroxysmal atrial fibrillation) (HCC)   CKD (chronic kidney disease) stage 2, GFR 60-89 ml/min   Gastroesophageal reflux disease     1.  COPD exacerbation: Agree with admission to hospital because of severity of symptoms. Aggressive bronchodilator therapy, oral steroids, inhalational steroids, scheduled and as needed bronchodilators, deep breathing exercises, incentive spirometry, chest physiotherapy and respiratory therapy consult. Antibiotics due to severity of symptoms.  We will treat with 5 days of azithromycin. Supplemental oxygen to keep saturations more than 92%.  2.  Chronic kidney  disease stage III: His renal functions are about his baseline.  3.  Chronic respiratory failure on hypoxia: Has oxygen arrangements at home.  He is more or less at his baseline.  4.  Paroxysmal atrial fibrillation: Currently sinus rhythm.  Patient is only on aspirin for anticoagulation.  Metoprolol for rate control.  Will continue.  5.  GERD: On PPI.  Continue.  6.  Hyperlipidemia: On a statin.  Will continue.    DVT prophylaxis: Lovenox subcu. Code Status: Full code. Family Communication: No family at bedside. Disposition Plan: Home with home health care. Consults called: None. Admission status: Inpatient general med.   Barb Merino MD [facility] Pager [phone]  If 7PM-7AM, please contact night-coverage [email or web address] Password [email or web address]  04/18/2018, 1:29 PM

## 2018-04-18 NOTE — ED Provider Notes (Signed)
[facility] EMERGENCY DEPARTMENT Provider Note   CSN: [id number] Arrival date & time: 04/18/18  8675     History   Chief Complaint Chief Complaint  Patient presents with  . Shortness of Breath    HPI Trevor Pitts is a 85 y.o. male.  Patient is a 85 year old male with a history of COPD, chronic kidney disease, hypertension, atrial fibrillation who presents with shortness of breath.  He states he has been short of breath since yesterday.  He has had worsening coughing which is productive of clear sputum.  He feels like there is increased mucus in his chest.  He denies any known fevers.  No associated chest pain.  No leg swelling.  He does report some right calf discomfort which he says is been going on for the last couple months and usually just bothers him at night.  He denies any associated leg swelling.  He is on baseline oxygen at 2 L/min chronically.  He did use his nebulizer machine this morning with some improvement in symptoms.     Past Medical History:  Diagnosis Date  . Asthma   . Atrial fibrillation ([place])   . Cancer [facility])    prostate - sp radiation w/o recurrence  . CKD (chronic kidney disease) stage 2, GFR 60-89 ml/min 07/20/2016  . COPD (chronic obstructive pulmonary disease) ([place])   . GERD (gastroesophageal reflux disease)   . H/O hiatal hernia   . Hernia of abdominal cavity   . Hypertension   . Pneumonia   . Recurrent upper respiratory infection (URI)   . Shortness of breath     Patient Active Problem List   Diagnosis Date Noted  . Shortness of breath   . Gastroesophageal reflux disease   . Acute exacerbation of chronic obstructive pulmonary disease (COPD) ([place]) 07/21/2016  . Respiratory distress 07/20/2016  . PAF (paroxysmal atrial fibrillation) ([place]) 07/20/2016  . Nonsustained paroxysmal supraventricular tachycardia ([place]) 07/20/2016  . CKD (chronic kidney disease) stage 2, GFR 60-89 ml/min 07/20/2016  . Dysuria 12/14/2013  .  Chronic rhinitis 11/02/2013  . Chronic respiratory failure with hypoxia ([place]) 08/21/2012  . Dysphagia 03/25/2011    Class: Acute  . COPD exacerbation ([place]) 03/22/2011  . CAP (community acquired pneumonia) 03/22/2011  . PROSTATE CANCER 03/28/2010  . Upper airway cough syndrome 03/28/2010  . HLD (hyperlipidemia) 02/13/2009  . Benign essential HTN 02/13/2009  . COPD GOLD III 02/13/2009  . HERN UNS SITE ABD CAV W/O MENTION OBST/GANGREN 02/13/2009    Past Surgical History:  Procedure Laterality Date  . COLON SURGERY          Home Medications    Prior to Admission medications   Medication Sig Start Date End Date Taking? Authorizing Provider  ALPRAZolam (XANAX) 0.25 MG tablet Take 0.25 mg by mouth 3 (three) times daily.   Yes [provider]  arformoterol (BROVANA) 15 MCG/2ML NEBU Take 15 mcg by nebulization 2 (two) times daily.   Yes [provider]  aspirin 81 MG tablet Take 81 mg by mouth daily.    Yes [provider]  atorvastatin (LIPITOR) 80 MG tablet Take 40 mg by mouth every other day.    Yes [provider]  benazepril (LOTENSIN) 40 MG tablet Take 40 mg by mouth 2 (two) times daily.    Yes [provider]  budesonide (PULMICORT) 0.5 MG/2ML nebulizer solution Take 0.5 mg by nebulization 2 (two) times daily.   Yes [provider]  donepezil (ARICEPT) 10 MG  tablet Take 10 mg by mouth at bedtime. 02/11/18  Yes [provider]  famotidine (PEPCID) 20 MG tablet Take 20 mg by mouth at bedtime.    Yes [provider]  metoprolol-hydrochlorothiazide (LOPRESSOR HCT) 50-25 MG tablet Take 1 tablet by mouth daily.   Yes [provider]  Multiple Vitamin (MULTIVITAMIN) capsule Take 1 capsule by mouth daily.   Yes [provider]  Multiple Vitamins-Minerals (ICAPS MV) TABS Take 1 tablet by mouth daily.    Yes [provider]  OXYGEN-HELIUM IN Pt uses o2 2lpm with exertion as needed- [employer]   Yes  [provider]  Probiotic CAPS Take 1 capsule by mouth daily.   Yes [provider]  Tiotropium Bromide Monohydrate (SPIRIVA RESPIMAT IN) Inhale 2 puffs into the lungs daily.   Yes [provider]  fluticasone (FLONASE) 50 MCG/ACT nasal spray Place 1 spray into both nostrils daily. Patient not taking: Reported on 04/18/2018 07/23/16   Barton Dubois, MD  guaiFENesin (MUCINEX) 600 MG 12 hr tablet Take 1 tablet (600 mg total) by mouth 2 (two) times daily. Patient not taking: Reported on 04/18/2018 07/22/16   Barton Dubois, MD    Family History Family History  Problem Relation Age of Onset  . Heart attack Father     Social History Social History   Tobacco Use  . Smoking status: Former Smoker    Packs/day: 0.50    Years: 25.00    Pack years: 12.50    Types: Cigarettes, Pipe, Cigars  . Smokeless tobacco: Never Used  Substance Use Topics  . Alcohol use: No  . Drug use: No     Allergies   Sulfa antibiotics   Review of Systems Review of Systems  Constitutional: Positive for fatigue. Negative for chills, diaphoresis and fever.  HENT: Negative for congestion, rhinorrhea and sneezing.   Eyes: Negative.   Respiratory: Positive for cough, shortness of breath and wheezing. Negative for chest tightness.   Cardiovascular: Negative for chest pain and leg swelling.  Gastrointestinal: Negative for abdominal pain, blood in stool, diarrhea, nausea and vomiting.  Genitourinary: Negative for difficulty urinating, flank pain, frequency and hematuria.  Musculoskeletal: Negative for arthralgias and back pain.  Skin: Negative for rash.  Neurological: Negative for dizziness, speech difficulty, weakness, numbness and headaches.     Physical Exam Updated Vital Signs BP (!) 124/51   Pulse (!) 121   Temp 97.6 F (36.4 C) (Oral)   Resp (!) 23   Ht 5\' 8"  (1.727 m)   Wt 74.8 kg   SpO2 95%   BMI 25.09 kg/m   Physical Exam Constitutional:      Appearance: He is  well-developed.  HENT:     Head: Normocephalic and atraumatic.  Eyes:     Pupils: Pupils are equal, round, and reactive to light.  Neck:     Musculoskeletal: Normal range of motion and neck supple.  Cardiovascular:     Rate and Rhythm: Normal rate and regular rhythm.     Heart sounds: Normal heart sounds.  Pulmonary:     Effort: Pulmonary effort is normal. No respiratory distress.     Breath sounds: Wheezing and rhonchi present. No rales.  Chest:     Chest wall: No tenderness.  Abdominal:     General: Bowel sounds are normal.     Palpations: Abdomen is soft.     Tenderness: There is no abdominal tenderness. There is no guarding or rebound.  Musculoskeletal: Normal range of motion.  Comments: Patient has some mild tenderness to palpation of the posterior right calf.  No warmth or erythema  Lymphadenopathy:     Cervical: No cervical adenopathy.  Skin:    General: Skin is warm and dry.     Findings: No rash.  Neurological:     Mental Status: He is alert and oriented to person, place, and time.      ED Treatments / Results  Labs (all labs ordered are listed, but only abnormal results are displayed) Labs Reviewed  BASIC METABOLIC PANEL - Abnormal; Notable for the following components:      Result Value   Glucose, Bld 111 (*)    BUN 41 (*)    Creatinine, Ser 1.85 (*)    GFR calc non Af Amer 31 (*)    GFR calc Af Amer 36 (*)    All other components within normal limits  CBC - Abnormal; Notable for the following components:   WBC 12.4 (*)    All other components within normal limits  D-DIMER, QUANTITATIVE (NOT AT [facility]) - Abnormal; Notable for the following components:   D-Dimer, Quant 2.10 (*)    All other components within normal limits  INFLUENZA PANEL BY PCR (TYPE A & B)  I-STAT TROPONIN, ED    EKG EKG Interpretation  Date/Time:  Monday April 18 2018 11:48:32 EST Ventricular Rate:  118 PR Interval:    QRS Duration: 132 QT Interval:  345 QTC  Calculation: 484 R Axis:   94 Text Interpretation:  Sinus node reentrant tachycardia RBBB and LPFB Baseline wander in lead(s) V3 Confirmed by Malvin Johns [phone]) on 04/18/2018 11:51:55 AM   ED ECG REPORT   Date: 04/18/2018  Rate: 100  Rhythm: normal sinus rhythm and sinus tachycardia  QRS Axis: normal  Intervals: normal  ST/T Wave abnormalities: nonspecific ST/T changes  Conduction Disutrbances:right bundle branch block  Narrative Interpretation:   Old EKG Reviewed: changes noted  I have personally reviewed the EKG tracing and agree with the computerized printout as noted.  Radiology Dg Chest 2 View  Result Date: 04/18/2018 CLINICAL DATA:  85 year old with chest pain. Weakness and shortness of breath. EXAM: CHEST - 2 VIEW COMPARISON:  03/22/2018 FINDINGS: Chronic hyperinflation and findings are suggestive for emphysematous disease. Cardiomediastinal silhouette is stable. Again noted are densities in the right lower chest but concern for acute on chronic disease. No other evidence for focal new lung densities. No large pleural effusions. Atherosclerotic calcifications at the aortic arch. IMPRESSION: Densities in the right lower chest are concerning for acute on chronic disease. Cannot exclude infectious etiology at the right lung base. Stable hyperinflation and emphysematous disease. Electronically Signed   By: Markus Daft M.D.   On: 04/18/2018 09:43   Ct Angio Chest Pe W/cm &/or Wo Cm  Result Date: 04/18/2018 CLINICAL DATA:  Shortness of breath and cough EXAM: CT ANGIOGRAPHY CHEST WITH CONTRAST TECHNIQUE: Multidetector CT imaging of the chest was performed using the standard protocol during bolus administration of intravenous contrast. Multiplanar CT image reconstructions and MIPs were obtained to evaluate the vascular anatomy. CONTRAST:  58mL ISOVUE-370 IOPAMIDOL (ISOVUE-370) INJECTION 76% COMPARISON:  Chest radiograph April 18, 2018 FINDINGS: Cardiovascular: There is no demonstrable  pulmonary embolus. The ascending thoracic aorta has a maximum transverse diameter of 4.2 x 4.2 cm. There is no demonstrable aortic dissection. There are foci atherosclerotic calcification in the proximal visualized great vessels. There is aortic atherosclerosis with irregular plaque throughout the proximal to mid descending thoracic aorta. There are foci  of coronary artery calcification. There is no pericardial effusion or pericardial thickening evident. There is left ventricular hypertrophy. Mediastinum/Nodes: No thyroid lesions are evident. There is no appreciable thoracic adenopathy. Several lymph nodes show evident calcification consistent with prior granulomatous disease. There is a small hiatal hernia. Lungs/Pleura: There is underlying bullous emphysematous change. There are scattered calcified granulomas. There is localized scarring with bronchiectatic change in the left upper lobe. There is atelectatic change in the lung bases. There is an area of scarring with mild bronchiectatic change in the left lower lobe. There is no frank edema or consolidation. There is no appreciable pleural effusion. There is epicardial fat prominence along the left and right heart borders. Upper Abdomen: There are calcified splenic granulomas. There is upper abdominal aortic atherosclerosis. Visualized upper abdominal structures otherwise appear unremarkable. Musculoskeletal: There are no blastic or lytic bone lesions. There is no intramuscular lesion. There is degenerative change in each shoulder, somewhat more severe on the left than on the right. Review of the MIP images confirms the above findings. IMPRESSION: 1.  No demonstrable pulmonary embolus. 2. Prominence of the ascending thoracic aorta with a maximum transverse diameter 4.2 x 4.2 cm. No evident dissection. Extensive aortic atherosclerosis with irregular plaque throughout the proximal to mid descending thoracic aorta noted. Foci of coronary artery calcification and  great vessel calcification noted. Recommend semi-annual imaging followup by CTA or MRA and referral to cardiothoracic surgery if not already obtained. This recommendation follows 2010 ACCF/AHA/AATS/ACR/ASA/SCA/SCAI/SIR/STS/SVM Guidelines for the Diagnosis and Management of Patients With Thoracic Aortic Disease. Circulation. 2010; 121: Y650-P54. Aortic aneurysm NOS (ICD10-I71.9). 3.  There is left ventricular hypertrophy. 4. Extensive emphysematous change. Areas of cicatrization with associated bronchiectatic change in the left upper and left lower lobe regions. Scattered areas of atelectasis. No frank edema consolidation evident. 5. Evidence of prior granulomatous disease with calcified parenchymal lung granulomas as well as several calcified lymph nodes and splenic calcified granulomas. 6.  No adenopathy by size criteria. 7.  Fairly small hiatal hernia. Aortic Atherosclerosis (ICD10-I70.0) and Emphysema (ICD10-J43.9). Electronically Signed   By: Lowella Grip III M.D.   On: 04/18/2018 10:41   Vas [place] Lower Extremity Venous (dvt) (only Mc & Wl 7a-7p)  Result Date: 04/18/2018  Lower Venous Study Indications: Edema, SOB, and cough.  Comparison Study: No prior study on file Performing Technologist: Sharion Dove RVS  Examination Guidelines: A complete evaluation includes B-mode imaging, spectral Doppler, color Doppler, and power Doppler as needed of all accessible portions of each vessel. Bilateral testing is considered an integral part of a complete examination. Limited examinations for reoccurring indications may be performed as noted.  Right Venous Findings: +---------+---------------+---------+-----------+----------+-------+          CompressibilityPhasicitySpontaneityPropertiesSummary +---------+---------------+---------+-----------+----------+-------+ CFV      Full           Yes      Yes                          +---------+---------------+---------+-----------+----------+-------+ SFJ       Full                                                 +---------+---------------+---------+-----------+----------+-------+ FV Prox  Full                                                 +---------+---------------+---------+-----------+----------+-------+  FV Mid   Full                                                 +---------+---------------+---------+-----------+----------+-------+ FV DistalFull                                                 +---------+---------------+---------+-----------+----------+-------+ PFV      Full                                                 +---------+---------------+---------+-----------+----------+-------+ POP      Full           Yes      Yes                          +---------+---------------+---------+-----------+----------+-------+ PTV      Full                                                 +---------+---------------+---------+-----------+----------+-------+ PERO     Full                                                 +---------+---------------+---------+-----------+----------+-------+  Left Venous Findings: +---+---------------+---------+-----------+----------+-------+    CompressibilityPhasicitySpontaneityPropertiesSummary +---+---------------+---------+-----------+----------+-------+ CFVFull           Yes      Yes                          +---+---------------+---------+-----------+----------+-------+    Summary: Right: There is no evidence of deep vein thrombosis in the lower extremity.  *See table(s) above for measurements and observations.    Preliminary     Procedures Procedures (including critical care time)  Medications Ordered in ED Medications  sodium chloride 0.9 % bolus 500 mL (500 mLs Intravenous New Bag/Given 04/18/18 1057)  iopamidol (ISOVUE-370) 76 % injection (has no administration in time range)  sodium chloride flush (NS) 0.9 % injection 3 mL (3 mLs Intravenous Given 04/18/18 0904)   methylPREDNISolone sodium succinate (SOLU-MEDROL) 125 mg/2 mL injection 125 mg (125 mg Intravenous Given 04/18/18 1111)  albuterol (PROVENTIL) (2.5 MG/3ML) 0.083% nebulizer solution 5 mg (5 mg Nebulization Given 04/18/18 1058)  iopamidol (ISOVUE-370) 76 % injection 100 mL (80 mLs Intravenous Contrast Given 04/18/18 1021)     Initial Impression / Assessment and Plan / ED Course  I have reviewed the triage vital signs and the nursing notes.  Pertinent labs & imaging results that were available during my care of the patient were reviewed by me and considered in my medical decision making (see chart for details).     Patient is a 85 year old male who presents with worsening shortness of breath since yesterday.  His chest x-ray does not show a definitive pneumonia.  He did have  an elevated d-dimer and a CT chest was performed which shows no evidence of pneumonia.  No PE.  There is some enlargement of his thoracic aorta which will need outpatient follow-up.  His labs are similar to baseline values.  He was given nebulizer treatment in the ED as well as Solu-Medrol.  He remains tachycardic and a little tachypneic.  His breathing has improved but he still has shortness of breath.  I feel his symptoms are consistent with a COPD exacerbation and potentially an overlying bronchitis or viral syndrome.  An influenza test was ordered.  He does appear to be in a sinus rhythm.  I spoke with Dr. Sloan Leiter who will admit the patient for further treatment.  Final Clinical Impressions(s) / ED Diagnoses   Final diagnoses:  COPD exacerbation ([place])  Dilation of thoracic aorta [facility])    ED Discharge Orders    None       Malvin Johns, MD 04/18/18 1153

## 2018-04-19 DIAGNOSIS — N182 Chronic kidney disease, stage 2 (mild): Secondary | ICD-10-CM

## 2018-04-19 DIAGNOSIS — I10 Essential (primary) hypertension: Secondary | ICD-10-CM

## 2018-04-19 DIAGNOSIS — J9611 Chronic respiratory failure with hypoxia: Secondary | ICD-10-CM

## 2018-04-19 LAB — CBC
HEMATOCRIT: 39.3 % (ref 39.0–52.0)
Hemoglobin: 12.6 g/dL — ABNORMAL LOW (ref 13.0–17.0)
MCH: 30.9 pg (ref 26.0–34.0)
MCHC: 32.1 g/dL (ref 30.0–36.0)
MCV: 96.3 fL (ref 80.0–100.0)
NRBC: 0 % (ref 0.0–0.2)
Platelets: 287 10*3/uL (ref 150–400)
RBC: 4.08 MIL/uL — ABNORMAL LOW (ref 4.22–5.81)
RDW: 13.9 % (ref 11.5–15.5)
WBC: 12.4 10*3/uL — ABNORMAL HIGH (ref 4.0–10.5)

## 2018-04-19 LAB — BASIC METABOLIC PANEL
Anion gap: 10 (ref 5–15)
BUN: 43 mg/dL — ABNORMAL HIGH (ref 8–23)
CHLORIDE: 109 mmol/L (ref 98–111)
CO2: 24 mmol/L (ref 22–32)
Calcium: 8.6 mg/dL — ABNORMAL LOW (ref 8.9–10.3)
Creatinine, Ser: 1.69 mg/dL — ABNORMAL HIGH (ref 0.61–1.24)
GFR calc Af Amer: 40 mL/min — ABNORMAL LOW (ref >60)
GFR calc non Af Amer: 35 mL/min — ABNORMAL LOW (ref >60)
Glucose, Bld: 131 mg/dL — ABNORMAL HIGH (ref 70–99)
Potassium: 4.3 mmol/L (ref 3.5–5.1)
Sodium: 143 mmol/L (ref 135–145)

## 2018-04-19 MED ORDER — METHYLPREDNISOLONE SODIUM SUCC 125 MG IJ SOLR
60.0000 mg | Freq: Three times a day (TID) | INTRAMUSCULAR | Status: DC
  Administered 2018-04-19 – 2018-04-20 (×4): 60 mg via INTRAVENOUS
  Filled 2018-04-19 (×4): qty 2

## 2018-04-19 MED ORDER — IPRATROPIUM-ALBUTEROL 0.5-2.5 (3) MG/3ML IN SOLN
3.0000 mL | Freq: Three times a day (TID) | RESPIRATORY_TRACT | Status: DC
  Administered 2018-04-19 – 2018-04-20 (×4): 3 mL via RESPIRATORY_TRACT
  Filled 2018-04-19 (×4): qty 3

## 2018-04-19 MED ORDER — ENSURE ENLIVE PO LIQD
237.0000 mL | Freq: Two times a day (BID) | ORAL | Status: DC
  Administered 2018-04-19 – 2018-04-22 (×6): 237 mL via ORAL

## 2018-04-19 NOTE — Progress Notes (Signed)
PROGRESS NOTE        PATIENT DETAILS Name: Trevor Pitts Age: 85 y.o. Sex: male Date of Birth: 1925/02/21 Admit Date: 04/18/2018 Admitting Physician Barb Merino, MD NTI:[id number], Mauro Kaufmann, MD  Brief Narrative: Patient is a 85 y.o. male with history of chronic hypoxic respiratory failure on home O2, COPD who presented with several days history of productive cough, worsening shortness of breath, found to have COPD exacerbation and admitted to the hospitalist service.  See below for further details.  Subjective: Still with wheezing-overall better but not yet back to baseline.  Assessment/Plan: COPD exacerbation: Some improvement-moving air well but diminished at bases with coarse expiratory wheezing.  Continue IV steroids, bronchodilators and empiric antimicrobial therapy.  Although improved-clearly not at baseline and requires several more days of hospitalization.   CKD stage III: Creatinine close to usual baseline.  Follow periodically.  PAF: Rate controlled with metoprolol-suspect due to advanced age he is not on anticoagulation-continue aspirin.  GERD: Continue PPI  Hypertension: Controlled, continue with benazepril,: HCTZ.  Dyslipidemia: Continue statin  Chronic hypoxic respiratory failure: Continue usual home regimen.  Deconditioning: Secondary to COPD exacerbation-underlying frailty-PT evaluation completed-recommendations are for SNF on discharge.  DVT Prophylaxis: Prophylactic Lovenox   Code Status: DNR-reconfirmed with patient this morning.  Family Communication: None at bedside  Disposition Plan: Remain inpatient  Antimicrobial agents: Anti-infectives (From admission, onward)   Start     Dose/Rate Route Frequency Ordered Stop   04/19/18 1730  azithromycin (ZITHROMAX) tablet 500 mg     500 mg Oral Daily 04/18/18 1620 04/23/18 0959   04/18/18 1730  azithromycin (ZITHROMAX) 500 mg in sodium chloride 0.9 % 250 mL IVPB     500  mg 250 mL/hr over 60 Minutes Intravenous Every 24 hours 04/18/18 1620 04/18/18 1906      Procedures: None  CONSULTS:  None  Time spent: 25- minutes-Greater than 50% of this time was spent in counseling, explanation of diagnosis, planning of further management, and coordination of care.  MEDICATIONS: Scheduled Meds: . acidophilus  1 capsule Oral Daily  . ALPRAZolam  0.25 mg Oral TID  . aspirin EC  81 mg Oral Daily  . atorvastatin  40 mg Oral QODAY  . azithromycin  500 mg Oral Daily  . benazepril  40 mg Oral BID  . budesonide  0.5 mg Nebulization BID  . donepezil  10 mg Oral QHS  . enoxaparin (LOVENOX) injection  30 mg Subcutaneous Q24H  . famotidine  20 mg Oral QHS  . guaiFENesin  600 mg Oral BID  . metoprolol tartrate  50 mg Oral Daily   And  . hydrochlorothiazide  25 mg Oral Daily  . ipratropium-albuterol  3 mL Nebulization TID  . methylPREDNISolone (SOLU-MEDROL) injection  60 mg Intravenous Q8H  . multivitamin with minerals  1 tablet Oral Daily   Continuous Infusions: PRN Meds:.acetaminophen **OR** acetaminophen, albuterol   PHYSICAL EXAM: Vital signs: Vitals:   04/19/18 0859 04/19/18 0901 04/19/18 0903 04/19/18 0906  BP: 114/67 122/68 117/70 123/70  Pulse: 70 81 78 74  Resp: 19     Temp: 98.4 F (36.9 C)     TempSrc: Oral     SpO2: 98% (!) 70%  96%  Weight:      Height:       Filed Weights   04/18/18 0845 04/18/18 1547 04/19/18 0417  Weight: 74.8 kg  70.1 kg 69.8 kg   Body mass index is 24.09 kg/m.   General appearance :Awake, alert, not in any distress.  HEENT: Atraumatic and Normocephalic Neck: supple, no JVD. Resp:Good air entry bilaterally, coarse rhonchi all over CVS: S1 S2 regular  GI: Bowel sounds present, Non tender and not distended with no gaurding, rigidity or rebound.No organomegaly Extremities: B/L Lower Ext shows no edema, both legs are warm to touch Neurology:  speech clear,Non focal, sensation is grossly intact. Psychiatric: Normal  judgment and insight. Alert and oriented x 3. Normal mood. Musculoskeletal:No digital cyanosis Skin:No Rash, warm and dry Wounds:N/A  I have personally reviewed following labs and imaging studies  LABORATORY DATA: CBC: Recent Labs  Lab 04/18/18 0845 04/19/18 0330  WBC 12.4* 12.4*  HGB 14.4 12.6*  HCT 44.5 39.3  MCV 97.4 96.3  PLT 341 295    Basic Metabolic Panel: Recent Labs  Lab 04/18/18 0845 04/19/18 0330  NA 145 143  K 3.9 4.3  CL 107 109  CO2 27 24  GLUCOSE 111* 131*  BUN 41* 43*  CREATININE 1.85* 1.69*  CALCIUM 9.6 8.6*    GFR: Estimated Creatinine Clearance: 26.6 mL/min (A) (by C-G formula based on SCr of 1.69 mg/dL (H)).  Liver Function Tests: No results for input(s): AST, ALT, ALKPHOS, BILITOT, PROT, ALBUMIN in the last 168 hours. No results for input(s): LIPASE, AMYLASE in the last 168 hours. No results for input(s): AMMONIA in the last 168 hours.  Coagulation Profile: No results for input(s): INR, PROTIME in the last 168 hours.  Cardiac Enzymes: No results for input(s): CKTOTAL, CKMB, CKMBINDEX, TROPONINI in the last 168 hours.  BNP (last 3 results) No results for input(s): PROBNP in the last 8760 hours.  HbA1C: No results for input(s): HGBA1C in the last 72 hours.  CBG: No results for input(s): GLUCAP in the last 168 hours.  Lipid Profile: No results for input(s): CHOL, HDL, LDLCALC, TRIG, CHOLHDL, LDLDIRECT in the last 72 hours.  Thyroid Function Tests: No results for input(s): TSH, T4TOTAL, FREET4, T3FREE, THYROIDAB in the last 72 hours.  Anemia Panel: No results for input(s): VITAMINB12, FOLATE, FERRITIN, TIBC, IRON, RETICCTPCT in the last 72 hours.  Urine analysis:    Component Value Date/Time   COLORURINE YELLOW 06/30/2015 [place] 06/30/2015 1227   LABSPEC 1.020 06/30/2015 1227   LABSPEC 1.020 10/16/2010 1539   PHURINE 6.0 06/30/2015 1227   GLUCOSEU NEGATIVE 06/30/2015 1227   GLUCOSEU NEGATIVE 12/14/2013  1224   HGBUR NEGATIVE 06/30/2015 1227   BILIRUBINUR NEGATIVE 06/30/2015 1227   BILIRUBINUR Negative 10/16/2010 1539   KETONESUR NEGATIVE 06/30/2015 1227   PROTEINUR NEGATIVE 06/30/2015 1227   UROBILINOGEN 0.2 12/14/2013 1224   NITRITE NEGATIVE 06/30/2015 1227   LEUKOCYTESUR NEGATIVE 06/30/2015 1227   LEUKOCYTESUR Trace 10/16/2010 1539    Sepsis Labs: Lactic Acid, Venous No results found for: LATICACIDVEN  MICROBIOLOGY: No results found for this or any previous visit (from the past 240 hour(s)).  RADIOLOGY STUDIES/RESULTS: Dg Chest 2 View  Result Date: 04/18/2018 CLINICAL DATA:  85 year old with chest pain. Weakness and shortness of breath. EXAM: CHEST - 2 VIEW COMPARISON:  03/22/2018 FINDINGS: Chronic hyperinflation and findings are suggestive for emphysematous disease. Cardiomediastinal silhouette is stable. Again noted are densities in the right lower chest but concern for acute on chronic disease. No other evidence for focal new lung densities. No large pleural effusions. Atherosclerotic calcifications at the aortic arch. IMPRESSION: Densities in the right lower chest are concerning for acute  on chronic disease. Cannot exclude infectious etiology at the right lung base. Stable hyperinflation and emphysematous disease. Electronically Signed   By: Markus Daft M.D.   On: 04/18/2018 09:43   Ct Angio Chest Pe W/cm &/or Wo Cm  Result Date: 04/18/2018 CLINICAL DATA:  Shortness of breath and cough EXAM: CT ANGIOGRAPHY CHEST WITH CONTRAST TECHNIQUE: Multidetector CT imaging of the chest was performed using the standard protocol during bolus administration of intravenous contrast. Multiplanar CT image reconstructions and MIPs were obtained to evaluate the vascular anatomy. CONTRAST:  49mL ISOVUE-370 IOPAMIDOL (ISOVUE-370) INJECTION 76% COMPARISON:  Chest radiograph April 18, 2018 FINDINGS: Cardiovascular: There is no demonstrable pulmonary embolus. The ascending thoracic aorta has a maximum  transverse diameter of 4.2 x 4.2 cm. There is no demonstrable aortic dissection. There are foci atherosclerotic calcification in the proximal visualized great vessels. There is aortic atherosclerosis with irregular plaque throughout the proximal to mid descending thoracic aorta. There are foci of coronary artery calcification. There is no pericardial effusion or pericardial thickening evident. There is left ventricular hypertrophy. Mediastinum/Nodes: No thyroid lesions are evident. There is no appreciable thoracic adenopathy. Several lymph nodes show evident calcification consistent with prior granulomatous disease. There is a small hiatal hernia. Lungs/Pleura: There is underlying bullous emphysematous change. There are scattered calcified granulomas. There is localized scarring with bronchiectatic change in the left upper lobe. There is atelectatic change in the lung bases. There is an area of scarring with mild bronchiectatic change in the left lower lobe. There is no frank edema or consolidation. There is no appreciable pleural effusion. There is epicardial fat prominence along the left and right heart borders. Upper Abdomen: There are calcified splenic granulomas. There is upper abdominal aortic atherosclerosis. Visualized upper abdominal structures otherwise appear unremarkable. Musculoskeletal: There are no blastic or lytic bone lesions. There is no intramuscular lesion. There is degenerative change in each shoulder, somewhat more severe on the left than on the right. Review of the MIP images confirms the above findings. IMPRESSION: 1.  No demonstrable pulmonary embolus. 2. Prominence of the ascending thoracic aorta with a maximum transverse diameter 4.2 x 4.2 cm. No evident dissection. Extensive aortic atherosclerosis with irregular plaque throughout the proximal to mid descending thoracic aorta noted. Foci of coronary artery calcification and great vessel calcification noted. Recommend semi-annual imaging  followup by CTA or MRA and referral to cardiothoracic surgery if not already obtained. This recommendation follows 2010 ACCF/AHA/AATS/ACR/ASA/SCA/SCAI/SIR/STS/SVM Guidelines for the Diagnosis and Management of Patients With Thoracic Aortic Disease. Circulation. 2010; 121: Y774-J28. Aortic aneurysm NOS (ICD10-I71.9). 3.  There is left ventricular hypertrophy. 4. Extensive emphysematous change. Areas of cicatrization with associated bronchiectatic change in the left upper and left lower lobe regions. Scattered areas of atelectasis. No frank edema consolidation evident. 5. Evidence of prior granulomatous disease with calcified parenchymal lung granulomas as well as several calcified lymph nodes and splenic calcified granulomas. 6.  No adenopathy by size criteria. 7.  Fairly small hiatal hernia. Aortic Atherosclerosis (ICD10-I70.0) and Emphysema (ICD10-J43.9). Electronically Signed   By: Lowella Grip III M.D.   On: 04/18/2018 10:41   Vas [place] Lower Extremity Venous (dvt) (only Mc & Wl 7a-7p)  Result Date: 04/18/2018  Lower Venous Study Indications: Edema, SOB, and cough.  Comparison Study: No prior study on file Performing Technologist: Sharion Dove RVS  Examination Guidelines: A complete evaluation includes B-mode imaging, spectral Doppler, color Doppler, and power Doppler as needed of all accessible portions of each vessel. Bilateral testing is considered an integral part  of a complete examination. Limited examinations for reoccurring indications may be performed as noted.  Right Venous Findings: +---------+---------------+---------+-----------+----------+-------+          CompressibilityPhasicitySpontaneityPropertiesSummary +---------+---------------+---------+-----------+----------+-------+ CFV      Full           Yes      Yes                          +---------+---------------+---------+-----------+----------+-------+ SFJ      Full                                                  +---------+---------------+---------+-----------+----------+-------+ FV Prox  Full                                                 +---------+---------------+---------+-----------+----------+-------+ FV Mid   Full                                                 +---------+---------------+---------+-----------+----------+-------+ FV DistalFull                                                 +---------+---------------+---------+-----------+----------+-------+ PFV      Full                                                 +---------+---------------+---------+-----------+----------+-------+ POP      Full           Yes      Yes                          +---------+---------------+---------+-----------+----------+-------+ PTV      Full                                                 +---------+---------------+---------+-----------+----------+-------+ PERO     Full                                                 +---------+---------------+---------+-----------+----------+-------+  Left Venous Findings: +---+---------------+---------+-----------+----------+-------+    CompressibilityPhasicitySpontaneityPropertiesSummary +---+---------------+---------+-----------+----------+-------+ CFVFull           Yes      Yes                          +---+---------------+---------+-----------+----------+-------+    Summary: Right: There is no evidence of deep vein thrombosis in the lower extremity.  *See table(s) above for measurements and observations. Electronically signed by Ruta Hinds MD on 04/18/2018 at  5:22:06 PM.    Final      LOS: 1 day   Oren Binet, MD  [facility]  If 7PM-7AM, please contact night-coverage  Please page via [email or web address]  Go to [email or web address] and use [place]'s universal password to access. If you do not have the password, please contact the hospital operator.  Locate the [facility] provider you are looking for under [facility] and page to a number that you can be directly reached. If you still have difficulty reaching the provider, please page the [facility] (Director on Call) for the Hospitalists listed on amion for assistance.  04/19/2018, 11:57 AM

## 2018-04-19 NOTE — Progress Notes (Signed)
Occupational Therapy Evaluation Patient Details Name: Trevor Pitts MRN: [id number] DOB: Apr 18, 1924 Today's Date: 04/19/2018    History of Present Illness Trevor Pitts is a 85 y.o. male with medical history significant of COPD, chronic hypoxic respiratory failure on 2 L of oxygen at home, hypertension, atrial fibrillation, chronic kidney disease stage III who presents to the emergency room with shortness of breath for the last few days.   Clinical Impression   Pt presents to OT with generalized weakness and deficits in dynamic standing balance. Pt is eager to return to PLOF and motivated. Pt required min guard during ADL transfers and LB dressing. Recommend brief SNF stay to maximize safety and independence at home. OT will continue to follow acutely.     Follow Up Recommendations  SNF;Supervision - Intermittent(Pt requesting [facility] in [place])    Equipment Recommendations  None recommended by OT(Defer to next venue)    Recommendations for Other Services PT consult     Precautions / Restrictions Precautions Precautions: Fall Restrictions Weight Bearing Restrictions: No      Mobility Bed Mobility Overal bed mobility: Needs Assistance Bed Mobility: Supine to Sit     Supine to sit: Supervision        Transfers Overall transfer level: Needs assistance   Transfers: Sit to/from Stand;Stand Pivot Transfers Sit to Stand: Min guard Stand pivot transfers: Min guard       General transfer comment: cueing for safety    Balance Overall balance assessment: Mild deficits observed, not formally tested                                         ADL either performed or assessed with clinical judgement   ADL Overall ADL's : Needs assistance/impaired Eating/Feeding: Modified independent;Sitting   Grooming: Oral care;Sitting;Supervision/safety   Upper Body Bathing: Supervision/ safety;Sitting   Lower Body Bathing: Min guard;Sit to/from stand   Upper  Body Dressing : Supervision/safety;Sitting   Lower Body Dressing: Min guard;Sit to/from stand   Toilet Transfer: Min guard;Ambulation;Stand-pivot;Cueing for safety   Toileting- Clothing Manipulation and Hygiene: Min guard;Sit to/from stand       Functional mobility during ADLs: Min guard General ADL Comments: Min guard and occasional cueing for safety during SPT to recliner and during ADLs     Vision Baseline Vision/History: Wears glasses Wears Glasses: Reading only Patient Visual Report: No change from baseline Vision Assessment?: No apparent visual deficits            Pertinent Vitals/Pain Pain Assessment: No/denies pain     Hand Dominance Right   Extremity/Trunk Assessment Upper Extremity Assessment Upper Extremity Assessment: Overall WFL for tasks assessed   Lower Extremity Assessment Lower Extremity Assessment: Defer to PT evaluation   Cervical / Trunk Assessment Cervical / Trunk Assessment: Normal   Communication Communication Communication: No difficulties   Cognition Arousal/Alertness: Awake/alert Behavior During Therapy: WFL for tasks assessed/performed Overall Cognitive Status: Within Functional Limits for tasks assessed                                     General Comments  Pt pleasant and cooperative- very mild STM deficits as pt asked OT same question x3            Home Living Family/patient expects to be discharged to:: Skilled nursing facility  Additional Comments: Pt's 2 sons live nearby and assist with IADLs but are unable to provide daily physical assist      Prior Functioning/Environment Level of Independence: Independent with assistive device(s)        Comments: Pt was driving, preparing simple meals in the home, and occasionally using RW in the home to not trip on O2 cord        OT Problem List: Impaired balance (sitting and/or standing);Decreased  coordination;Decreased knowledge of use of DME or AE;Decreased safety awareness;Decreased activity tolerance      OT Treatment/Interventions: Self-care/ADL training;Therapeutic exercise;Balance training;Patient/family education;Energy conservation;DME and/or AE instruction;Therapeutic activities    OT Goals(Current goals can be found in the care plan section) Acute Rehab OT Goals Patient Stated Goal: "get more rehab at [facility]" OT Goal Formulation: With patient Time For Goal Achievement: 04/28/18 Potential to Achieve Goals: Good  OT Frequency: Min 2X/week    AM-PAC OT "6 Clicks" Daily Activity     Outcome Measure Help from another person eating meals?: None Help from another person taking care of personal grooming?: A Little Help from another person toileting, which includes using toliet, bedpan, or urinal?: A Little Help from another person bathing (including washing, rinsing, drying)?: A Little Help from another person to put on and taking off regular upper body clothing?: A Little Help from another person to put on and taking off regular lower body clothing?: A Little 6 Click Score: 19   End of Session Equipment Utilized During Treatment: Gait belt  Activity Tolerance: Patient tolerated treatment well Patient left: in chair;with call bell/phone within reach;with chair alarm set  OT Visit Diagnosis: Unsteadiness on feet (R26.81);Muscle weakness (generalized) (M62.81)                Time: 2111-7356 OT Time Calculation (min): 24 min Charges:  OT General Charges $OT Visit: 1 Visit OT Evaluation $OT Eval Low Complexity: 1 Low OT Treatments $Self Care/Home Management : 8-22 mins  Curtis Sites OTR/L  04/19/2018, 10:43 AM

## 2018-04-19 NOTE — Care Management Note (Signed)
Case Management Note  Patient Details  Name: Trevor Pitts MRN: [id number] Date of Birth: 12/10/1924  Subjective/Objective:        COPD exacerbation.   Resides alone. DME: home oxygen/ 2l/min, [facility]. Resides alone. PTA independent with ADL'      Trevor Pitts ([address] Trevor Pitts [phone]     PCP: Dr. Beulah Gandy  Action/Plan: NCM received consult:COPD. Pt states COPD is managed per [facility], Dr. Windell Hummingbird. Pt states sees Dr. Windell Hummingbird every 4 months. Per OT  Recommendations: SNF;Supervision - Intermittent(Pt requesting [facility] in [place]). PT evaluations pending. CSW aware and following for potential SNF need. NCM will continue to follow for TOC needs.   Expected Discharge Date:                  Expected Discharge Plan:     In-House Referral:  Clinical Social Work  Discharge planning Services  CM Consult  Post Acute Care Choice:  Resumption of Svcs/PTA Provider, Durable Medical Equipment([facility] [employer] / oxygen) Choice offered to:  Patient  DME Arranged:    DME Agency:     HH Arranged:    [place] Agency:     Status of Service:  In process, will continue to follow  If discussed at Long Length of Stay Meetings, dates discussed:    Additional Comments:  Sharin Mons, RN 04/19/2018, 12:19 PM

## 2018-04-19 NOTE — Progress Notes (Addendum)
Initial Nutrition Assessment  DOCUMENTATION CODES:   Not applicable  INTERVENTION:   Ensure Enlive po BID, each supplement provides 350 kcal and 20 grams of protein  Recommend continue taking is MVI with Minerals.    NUTRITION DIAGNOSIS:   Increased nutrient needs related to catabolic illness, chronic illness(COPD) as evidenced by estimated needs.    GOAL:   Patient will meet greater than or equal to 90% of their needs    MONITOR:   PO intake, Supplement acceptance, Weight trends  REASON FOR ASSESSMENT:   Consult COPD Protocol  ASSESSMENT:   Pt is 54y M with PMH of COPD with chronic hypoxic respiratory failure, HTN, A.Fib. Chronic Kidney disease stage III. Pt admitted on SOB with worsening cough and increased mucus and unable to expectorate the cough with COPD Excerbation.    Pt is 4 years widowed, living at home alone but is mobile.   Pt current recorded PO intake is 80-100%.   Pt reports decent appetite, usually for breakfast eating cereal, coffee with light cream, and a muffin (usually blueberry or similar). For lunch he typically eats a sandwich (like honey baked ham, peanut butter, etc.) a drink, sometimes a small can of baked beans and/or peaches. Almost always for dinner he eats out, usually at [employer] (in [place])--this is his favorite place to eat. Typically eating a vegetable plate but sometimes adding meat--that he is not a big meat eater.   Pt reports drinking Ensure or Boost or Equate brand of either, usually 2 times a day; he will also use these to take his medications during the day. Pt prefers milk chocolate flavor.   Medications reviewed and include: Steroid (Solu-Medrol), MVI with minerals (Pt reports he takes Centrum Silver at home). Probiotic (Risaquad).   Labs reviewed.   Pt reports that he gained wt in years prior and "Heart Dr" said he needed to lose wt. Over the last couple of years he has steadily lost weight from his previous 170 lb  to current 153 lbs. He is of Normal BMI.    NUTRITION - FOCUSED PHYSICAL EXAM:    Most Recent Value  Orbital Region  Mild depletion  Upper Arm Region  Mild depletion  Thoracic and Lumbar Region  No depletion  Buccal Region  No depletion  Temple Region  Mild depletion  Clavicle Bone Region  No depletion  Clavicle and Acromion Bone Region  Mild depletion  Scapular Bone Region  No depletion  Dorsal Hand  No depletion  Patellar Region  No depletion  Anterior Thigh Region  No depletion  Posterior Calf Region  No depletion  Edema (RD Assessment)  None  Hair  Reviewed  Eyes  Reviewed  Mouth  Reviewed  Skin  Reviewed  Nails  Reviewed       Diet Order:   Diet Order            Diet Heart Room service appropriate? Yes; Fluid consistency: Thin  Diet effective now              EDUCATION NEEDS:   No education needs have been identified at this time  Skin:  Skin Assessment: Reviewed RN Assessment  Last BM:  None Documented  Height:   Ht Readings from Last 1 Encounters:  04/18/18 5\' 7"  (1.702 m)    Weight:   Wt Readings from Last 1 Encounters:  04/19/18 69.8 kg    Ideal Body Weight:  67.3 kg  BMI:  Body mass index is 24.09 kg/m.  Estimated Nutritional Needs:   Kcal:  1600-1800  Protein:  80-90 grams  Fluid:  >1.6L    Herma Carson, [place] Dietetic Intern

## 2018-04-19 NOTE — Evaluation (Signed)
Physical Therapy Evaluation Patient Details Name: Trevor Pitts MRN: [id number] DOB: 30-Jun-1924 Today's Date: 04/19/2018   History of Present Illness  Trevor Pitts is a 85 y.o. male with medical history significant of COPD, chronic hypoxic respiratory failure on 2 L of oxygen at home, hypertension, atrial fibrillation, chronic kidney disease stage III who presents to the emergency room with shortness of breath for the last few days.  Clinical Impression  Pt admitted with above diagnosis. Pt currently with functional limitations due to the deficits listed below (see PT Problem List). Pt fatigues quickly with activity and SpO2 dropped to 88% on 2L O2, turned up to 3L for 5 mins for pt to recover to mid 90's. Pt would benefit from ST SNF before going home.  Pt will benefit from skilled PT to increase their independence and safety with mobility to allow discharge to the venue listed below.       Follow Up Recommendations SNF    Equipment Recommendations  None recommended by PT    Recommendations for Other Services       Precautions / Restrictions Precautions Precautions: Fall Precaution Comments: pt has fallen one time at his home over his O2 cord and has used RW in house since then Restrictions Weight Bearing Restrictions: No      Mobility  Bed Mobility               General bed mobility comments: pt received in chair  Transfers Overall transfer level: Needs assistance Equipment used: Rolling walker (2 wheeled) Transfers: Sit to/from Stand Sit to Stand: Min guard         General transfer comment: vc's for controlling descent, min-guard for safety  Ambulation/Gait Ambulation/Gait assistance: Min assist Gait Distance (Feet): 100 Feet Assistive device: Rolling walker (2 wheeled) Gait Pattern/deviations: Step-through pattern;Decreased stride length Gait velocity: decreased Gait velocity interpretation: <1.8 ft/sec, indicate of risk for recurrent falls General  Gait Details: pt with 3/4 DOE after ambulation, SpO2 down to 88% on 2L O2, turned up to 3L to recover and SpO2 returned to mid 90's within 5 mins and O2 returned to 2L.   Stairs            Wheelchair Mobility    Modified Rankin (Stroke Patients Only)       Balance Overall balance assessment: Mild deficits observed, not formally tested                                           Pertinent Vitals/Pain Pain Assessment: No/denies pain    Home Living Family/patient expects to be discharged to:: Skilled nursing facility                 Additional Comments: Pt's 2 sons live nearby and assist with IADLs but are unable to provide daily physical assist    Prior Function Level of Independence: Independent with assistive device(s)         Comments: Pt was driving, preparing simple meals in the home, and occasionally using RW in the home to not trip on O2 cord     Hand Dominance   Dominant Hand: Right    Extremity/Trunk Assessment   Upper Extremity Assessment Upper Extremity Assessment: Defer to OT evaluation    Lower Extremity Assessment Lower Extremity Assessment: LLE deficits/detail LLE Deficits / Details: LLE mildly weaker than R, 4-/5 compared to 4/5 throughout LLE Sensation:  WNL LLE Coordination: WNL    Cervical / Trunk Assessment Cervical / Trunk Assessment: Normal  Communication   Communication: No difficulties  Cognition Arousal/Alertness: Awake/alert Behavior During Therapy: WFL for tasks assessed/performed Overall Cognitive Status: No family/caregiver present to determine baseline cognitive functioning                                 General Comments: notable STM deficits on eval with pt repeating self several times, may be his baseline      General Comments General comments (skin integrity, edema, etc.): pt agreeable to ST SNF since he lives alone, widower x4 yrs    Exercises     Assessment/Plan    PT  Assessment Patient needs continued PT services  PT Problem List Decreased strength;Decreased activity tolerance;Decreased balance;Decreased mobility;Decreased cognition;Decreased safety awareness;Decreased knowledge of precautions;Cardiopulmonary status limiting activity       PT Treatment Interventions DME instruction;Gait training;Functional mobility training;Therapeutic activities;Therapeutic exercise;Balance training;Neuromuscular re-education;Cognitive remediation;Patient/family education    PT Goals (Current goals can be found in the Care Plan section)  Acute Rehab PT Goals Patient Stated Goal: "get more rehab at [facility]" PT Goal Formulation: With patient Time For Goal Achievement: 05/03/18 Potential to Achieve Goals: Good    Frequency Min 2X/week   Barriers to discharge Decreased caregiver support lives alone    Co-evaluation               AM-PAC PT "6 Clicks" Mobility  Outcome Measure Help needed turning from your back to your side while in a flat bed without using bedrails?: A Little Help needed moving from lying on your back to sitting on the side of a flat bed without using bedrails?: A Little Help needed moving to and from a bed to a chair (including a wheelchair)?: A Little Help needed standing up from a chair using your arms (e.g., wheelchair or bedside chair)?: A Little Help needed to walk in hospital room?: A Little Help needed climbing 3-5 steps with a railing? : A Lot 6 Click Score: 17    End of Session Equipment Utilized During Treatment: Gait belt;Oxygen Activity Tolerance: Patient tolerated treatment well Patient left: in chair;with call bell/phone within reach;with chair alarm set Nurse Communication: Mobility status PT Visit Diagnosis: Unsteadiness on feet (R26.81);Difficulty in walking, not elsewhere classified (R26.2);Muscle weakness (generalized) (M62.81)    Time: 4650-3546 PT Time Calculation (min) (ACUTE ONLY): 24 min   Charges:    PT Evaluation $PT Eval Moderate Complexity: 1 Mod PT Treatments $Gait Training: 8-22 mins        Leighton Roach, [place]  Pager [phone] [place] 04/19/2018, 4:12 PM

## 2018-04-19 NOTE — NC FL2 (Signed)
[place] MEDICAID FL2 LEVEL OF CARE SCREENING TOOL     IDENTIFICATION  Patient Name: Trevor Pitts Birthdate: 1924/04/15 Sex: male Admission Date (Current Location): 04/18/2018  [facility] and [place] Number:  Herbalist and Address:  [facility] [place], [place] [address] [place], [place] [address]      Provider Number: [id number]  Attending Physician Name and Address:  Jonetta Osgood, MD  Relative Name and Phone Number:  [name], son, [phone]    Current Level of Care: Hospital Recommended Level of Care: [place] Prior Approval Number:    Date Approved/Denied:   PASRR Number: [id number]  Discharge Plan: SNF    Current Diagnoses: Patient Active Problem List   Diagnosis Date Noted  . Shortness of breath   . Gastroesophageal reflux disease   . COPD with acute exacerbation ([place]) 07/21/2016  . Respiratory distress 07/20/2016  . PAF (paroxysmal atrial fibrillation) ([place]) 07/20/2016  . Nonsustained paroxysmal supraventricular tachycardia ([place]) 07/20/2016  . CKD (chronic kidney disease) stage 2, GFR 60-89 ml/min 07/20/2016  . Dysuria 12/14/2013  . Chronic rhinitis 11/02/2013  . Chronic respiratory failure with hypoxia ([place]) 08/21/2012  . Dysphagia 03/25/2011    Class: Acute  . COPD exacerbation ([place]) 03/22/2011  . CAP (community acquired pneumonia) 03/22/2011  . PROSTATE CANCER 03/28/2010  . Upper airway cough syndrome 03/28/2010  . HLD (hyperlipidemia) 02/13/2009  . Benign essential HTN 02/13/2009  . COPD GOLD III 02/13/2009  . HERN UNS SITE ABD CAV W/O MENTION OBST/GANGREN 02/13/2009    Orientation RESPIRATION BLADDER Height & Weight     Self, Time, Situation, Place  O2(Nasal cannula 1.5L) Continent Weight: 69.8 kg Height:  5\' 7"  (170.2 cm)  BEHAVIORAL SYMPTOMS/MOOD NEUROLOGICAL BOWEL NUTRITION STATUS      Continent Diet(Please see DC Summary)  AMBULATORY STATUS COMMUNICATION OF NEEDS Skin   Limited Assist  Verbally Normal                       Personal Care Assistance Level of Assistance  Bathing, Feeding, Dressing Bathing Assistance: Limited assistance Feeding assistance: Independent Dressing Assistance: Limited assistance     Functional Limitations Info  Hearing   Hearing Info: Impaired      SPECIAL CARE FACTORS FREQUENCY  PT (By licensed PT), OT (By licensed OT)     PT Frequency: 5x/week OT Frequency: 3x/week            Contractures Contractures Info: Not present    Additional Factors Info  Code Status, Allergies, Psychotropic Code Status Info: Full Allergies Info: Sulfa Antibiotics Psychotropic Info: Xanax         Current Medications (04/19/2018):  This is the current hospital active medication list Current Facility-Administered Medications  Medication Dose Route Frequency Provider Last Rate Last Dose  . acetaminophen (TYLENOL) tablet 650 mg  650 mg Oral Q6H PRN Barb Merino, MD   650 mg at 04/18/18 2130   Or  . acetaminophen (TYLENOL) suppository 650 mg  650 mg Rectal Q6H PRN Barb Merino, MD      . acidophilus (RISAQUAD) capsule 1 capsule  1 capsule Oral Daily Barb Merino, MD   1 capsule at 04/19/18 0847  . albuterol (PROVENTIL) (2.5 MG/3ML) 0.083% nebulizer solution 2.5 mg  2.5 mg Nebulization Q2H PRN Barb Merino, MD      . ALPRAZolam Duanne Moron) tablet 0.25 mg  0.25 mg Oral TID Barb Merino, MD   0.25 mg at 04/19/18 0847  . aspirin EC tablet  81 mg  81 mg Oral Daily Barb Merino, MD   81 mg at 04/19/18 0847  . atorvastatin (LIPITOR) tablet 40 mg  40 mg Oral Maxwell Caul, MD   40 mg at 04/18/18 1658  . azithromycin (ZITHROMAX) tablet 500 mg  500 mg Oral Daily Barb Merino, MD      . benazepril (LOTENSIN) tablet 40 mg  40 mg Oral BID Barb Merino, MD   40 mg at 04/19/18 0848  . budesonide (PULMICORT) nebulizer solution 0.5 mg  0.5 mg Nebulization BID Barb Merino, MD   0.5 mg at 04/19/18 0915  . donepezil (ARICEPT) tablet 10 mg  10 mg  Oral QHS Barb Merino, MD   10 mg at 04/18/18 2130  . enoxaparin (LOVENOX) injection 30 mg  30 mg Subcutaneous Q24H Barb Merino, MD      . famotidine (PEPCID) tablet 20 mg  20 mg Oral QHS Barb Merino, MD   20 mg at 04/18/18 2130  . guaiFENesin (MUCINEX) 12 hr tablet 600 mg  600 mg Oral BID Barb Merino, MD   600 mg at 04/19/18 0848  . metoprolol tartrate (LOPRESSOR) tablet 50 mg  50 mg Oral Daily Barb Merino, MD   50 mg at 04/19/18 0847   And  . hydrochlorothiazide (HYDRODIURIL) tablet 25 mg  25 mg Oral Daily Barb Merino, MD   25 mg at 04/19/18 0847  . ipratropium-albuterol (DUONEB) 0.5-2.5 (3) MG/3ML nebulizer solution 3 mL  3 mL Nebulization TID Barb Merino, MD   3 mL at 04/19/18 0914  . methylPREDNISolone sodium succinate (SOLU-MEDROL) 125 mg/2 mL injection 60 mg  60 mg Intravenous Q8H Ghimire, Henreitta Leber, MD   60 mg at 04/19/18 0848  . multivitamin with minerals tablet 1 tablet  1 tablet Oral Daily Barb Merino, MD   1 tablet at 04/19/18 5697     Discharge Medications: Please see discharge summary for a list of discharge medications.  Relevant Imaging Results:  Relevant Lab Results:   Additional Information SSN: Meyer Eryka Dolinger, LCSW

## 2018-04-19 NOTE — Clinical Social Work Note (Signed)
Clinical Social Work Assessment  Patient Details  Name: Trevor Pitts MRN: [id number] Date of Birth: Dec 14, 1924  Date of referral:  04/19/18               Reason for consult:  Facility Placement                Permission sought to share information with:  Facility Sport and exercise psychologist, Family Supports Permission granted to share information::  Yes, Verbal Permission Granted  Name::     Education officer, community::  SNFs  Relationship::  Son  Contact Information:  [phone]  Housing/Transportation Living arrangements for the past 2 months:  [place] of Information:  Patient Patient Interpreter Needed:  None Criminal Activity/Legal Involvement Pertinent to Current Situation/Hospitalization:  No - Comment as needed Significant Relationships:  Adult Children Lives with:  Self Do you feel safe going back to the place where you live?  No Need for family participation in patient care:  No (Coment)  Care giving concerns: CSW received consult for possible SNF placement at time of discharge. CSW spoke with patient regarding request for SNF placement at time of discharge. Patient reported that patient's family is currently unable to care for patient at their home given patient's current physical needs and fall risk. Patient expressed understanding that PT will need to make their recommendation and is agreeable to SNF placement at time of discharge. CSW to continue to follow and assist with discharge planning needs.   Social Worker assessment / plan:  CSW spoke with patient concerning possibility of rehab at [facility] before returning home.  Employment status:  Retired Forensic scientist:  Medicare PT Recommendations:  Not assessed at this time [place] / Referral to community resources:  [place]  Patient/Family's Response to care:  Patient recognizes need for rehab before returning home and is agreeable to a SNF. Patient reported preference for [name].  They will review the referral.   Patient/Family's Understanding of and Emotional Response to Diagnosis, Current Treatment, and Prognosis:  Patient/family is realistic regarding therapy needs and expressed being hopeful for SNF placement. Patient expressed understanding of CSW role and discharge process as well as medical condition. No questions/concerns about plan or treatment.    Emotional Assessment Appearance:  Appears stated age Attitude/Demeanor/Rapport:  Engaged Affect (typically observed):  Accepting, Appropriate Orientation:  Oriented to Self, Oriented to Place, Oriented to  Time, Oriented to Situation Alcohol / Substance use:  Not Applicable Psych involvement (Current and /or in the community):  No (Comment)  Discharge Needs  Concerns to be addressed:  Care Coordination Readmission within the last 30 days:  No Current discharge risk:  None Barriers to Discharge:  Continued Medical Work up   [employer], LCSW 04/19/2018, 12:07 PM

## 2018-04-20 MED ORDER — METHYLPREDNISOLONE SODIUM SUCC 40 MG IJ SOLR
40.0000 mg | Freq: Three times a day (TID) | INTRAMUSCULAR | Status: DC
  Administered 2018-04-20 – 2018-04-22 (×6): 40 mg via INTRAVENOUS
  Filled 2018-04-20 (×6): qty 1

## 2018-04-20 MED ORDER — IPRATROPIUM-ALBUTEROL 0.5-2.5 (3) MG/3ML IN SOLN
3.0000 mL | Freq: Four times a day (QID) | RESPIRATORY_TRACT | Status: DC
  Administered 2018-04-20 – 2018-04-22 (×8): 3 mL via RESPIRATORY_TRACT
  Filled 2018-04-20 (×9): qty 3

## 2018-04-20 NOTE — Progress Notes (Addendum)
PROGRESS NOTE        PATIENT DETAILS Name: Trevor Pitts Age: 85 y.o. Sex: male Date of Birth: [birth year] Admit Date: 04/18/2018 Admitting Physician Barb Merino, MD LEX:[id number], Mauro Kaufmann, MD  Brief Narrative: Patient is a 85 y.o. male with history of chronic hypoxic respiratory failure on home O2, COPD who presented with several days history of productive cough, worsening shortness of breath, found to have COPD exacerbation and admitted to the hospitalist service.  See below for further details.  Subjective: Improved but still wheezing  Assessment/Plan: COPD exacerbation: Improved-moving air well-continues to have some wheezing.  Continue IV steroids but taper, continue bronchodilators and empiric antimicrobial therapy.  Although improving-suspect requires 1-2 days in the hospital before consideration of discharge  CKD stage III: Creatinine close to baseline.  Follow.   PAF: Rate controlled with metoprolol-continue aspirin.  Suspect not on anticoagulation as an outpatient due to advanced age and frailty  GERD: Continue PPI  Hypertension: Controlled-continue benazepril, HCTZ.    Dyslipidemia: Continue statin  Chronic hypoxic respiratory failure: On 2 L of O2 via [place] at all times  Deconditioning: Secondary to COPD exacerbation-underlying frailty-PT evaluation completed-recommendations are for SNF on discharge.  Ascending aortic aneurysm 4.2 x 4.2 cm: Stable for outpatient follow-up with semi-annual imaging  DVT Prophylaxis: Prophylactic Lovenox   Code Status: DNR  Family Communication: None at bedside  Disposition Plan: Remain inpatient-hopefully to SNF tomorrow.  Antimicrobial agents: Anti-infectives (From admission, onward)   Start     Dose/Rate Route Frequency Ordered Stop   04/19/18 1730  azithromycin (ZITHROMAX) tablet 500 mg     500 mg Oral Daily 04/18/18 1620 04/23/18 0959   04/18/18 1730  azithromycin (ZITHROMAX) 500 mg in  sodium chloride 0.9 % 250 mL IVPB     500 mg 250 mL/hr over 60 Minutes Intravenous Every 24 hours 04/18/18 1620 04/18/18 1906      Procedures: None  CONSULTS:  None  Time spent: 25- minutes-Greater than 50% of this time was spent in counseling, explanation of diagnosis, planning of further management, and coordination of care.  MEDICATIONS: Scheduled Meds: . acidophilus  1 capsule Oral Daily  . ALPRAZolam  0.25 mg Oral TID  . aspirin EC  81 mg Oral Daily  . atorvastatin  40 mg Oral QODAY  . azithromycin  500 mg Oral Daily  . benazepril  40 mg Oral BID  . budesonide  0.5 mg Nebulization BID  . donepezil  10 mg Oral QHS  . enoxaparin (LOVENOX) injection  30 mg Subcutaneous Q24H  . famotidine  20 mg Oral QHS  . feeding supplement (ENSURE ENLIVE)  237 mL Oral BID BM  . guaiFENesin  600 mg Oral BID  . metoprolol tartrate  50 mg Oral Daily   And  . hydrochlorothiazide  25 mg Oral Daily  . ipratropium-albuterol  3 mL Nebulization TID  . methylPREDNISolone (SOLU-MEDROL) injection  60 mg Intravenous Q8H  . multivitamin with minerals  1 tablet Oral Daily   Continuous Infusions: PRN Meds:.acetaminophen **OR** acetaminophen, albuterol   PHYSICAL EXAM: Vital signs: Vitals:   04/19/18 1405 04/19/18 2011 04/19/18 2046 04/20/18 0742  BP:  (!) 143/81    Pulse:  94    Resp:  18    Temp:  98 F (36.7 C)    TempSrc:      SpO2: 95% 92% 90% 97%  Weight:      Height:       Filed Weights   04/18/18 0845 04/18/18 1547 04/19/18 0417  Weight: 74.8 kg 70.1 kg 69.8 kg   Body mass index is 24.09 kg/m.   General appearance:Awake, alert, not in any distress.  Eyes:no scleral icterus. HEENT: Atraumatic and Normocephalic Neck: supple, no JVD. Resp:Good air entry bilaterally, rhonchi all over but improved compared to yesterday CVS: S1 S2 regular GI: Bowel sounds present, Non tender and not distended with no gaurding, rigidity or rebound. Extremities: B/L Lower Ext shows no edema,  both legs are warm to touch Neurology:  Non focal Psychiatric: Normal judgment and insight. Normal mood. Musculoskeletal:No digital cyanosis Skin:No Rash, warm and dry Wounds:N/A  I have personally reviewed following labs and imaging studies  LABORATORY DATA: CBC: Recent Labs  Lab 04/18/18 0845 04/19/18 0330  WBC 12.4* 12.4*  HGB 14.4 12.6*  HCT 44.5 39.3  MCV 97.4 96.3  PLT 341 094    Basic Metabolic Panel: Recent Labs  Lab 04/18/18 0845 04/19/18 0330  NA 145 143  K 3.9 4.3  CL 107 109  CO2 27 24  GLUCOSE 111* 131*  BUN 41* 43*  CREATININE 1.85* 1.69*  CALCIUM 9.6 8.6*    GFR: Estimated Creatinine Clearance: 26.6 mL/min (A) (by C-G formula based on SCr of 1.69 mg/dL (H)).  Liver Function Tests: No results for input(s): AST, ALT, ALKPHOS, BILITOT, PROT, ALBUMIN in the last 168 hours. No results for input(s): LIPASE, AMYLASE in the last 168 hours. No results for input(s): AMMONIA in the last 168 hours.  Coagulation Profile: No results for input(s): INR, PROTIME in the last 168 hours.  Cardiac Enzymes: No results for input(s): CKTOTAL, CKMB, CKMBINDEX, TROPONINI in the last 168 hours.  BNP (last 3 results) No results for input(s): PROBNP in the last 8760 hours.  HbA1C: No results for input(s): HGBA1C in the last 72 hours.  CBG: No results for input(s): GLUCAP in the last 168 hours.  Lipid Profile: No results for input(s): CHOL, HDL, LDLCALC, TRIG, CHOLHDL, LDLDIRECT in the last 72 hours.  Thyroid Function Tests: No results for input(s): TSH, T4TOTAL, FREET4, T3FREE, THYROIDAB in the last 72 hours.  Anemia Panel: No results for input(s): VITAMINB12, FOLATE, FERRITIN, TIBC, IRON, RETICCTPCT in the last 72 hours.  Urine analysis:    Component Value Date/Time   COLORURINE YELLOW 06/30/2015 [place] 06/30/2015 1227   LABSPEC 1.020 06/30/2015 1227   LABSPEC 1.020 10/16/2010 1539   PHURINE 6.0 06/30/2015 1227   GLUCOSEU NEGATIVE  06/30/2015 1227   GLUCOSEU NEGATIVE 12/14/2013 1224   HGBUR NEGATIVE 06/30/2015 1227   BILIRUBINUR NEGATIVE 06/30/2015 1227   BILIRUBINUR Negative 10/16/2010 1539   KETONESUR NEGATIVE 06/30/2015 1227   PROTEINUR NEGATIVE 06/30/2015 1227   UROBILINOGEN 0.2 12/14/2013 1224   NITRITE NEGATIVE 06/30/2015 1227   LEUKOCYTESUR NEGATIVE 06/30/2015 1227   LEUKOCYTESUR Trace 10/16/2010 1539    Sepsis Labs: Lactic Acid, Venous No results found for: LATICACIDVEN  MICROBIOLOGY: No results found for this or any previous visit (from the past 240 hour(s)).  RADIOLOGY STUDIES/RESULTS: Dg Chest 2 View  Result Date: 04/18/2018 CLINICAL DATA:  85 year old with chest pain. Weakness and shortness of breath. EXAM: CHEST - 2 VIEW COMPARISON:  03/22/2018 FINDINGS: Chronic hyperinflation and findings are suggestive for emphysematous disease. Cardiomediastinal silhouette is stable. Again noted are densities in the right lower chest but concern for acute on chronic disease. No other evidence for focal new lung densities.  No large pleural effusions. Atherosclerotic calcifications at the aortic arch. IMPRESSION: Densities in the right lower chest are concerning for acute on chronic disease. Cannot exclude infectious etiology at the right lung base. Stable hyperinflation and emphysematous disease. Electronically Signed   By: Markus Daft M.D.   On: 04/18/2018 09:43   Ct Angio Chest Pe W/cm &/or Wo Cm  Result Date: 04/18/2018 CLINICAL DATA:  Shortness of breath and cough EXAM: CT ANGIOGRAPHY CHEST WITH CONTRAST TECHNIQUE: Multidetector CT imaging of the chest was performed using the standard protocol during bolus administration of intravenous contrast. Multiplanar CT image reconstructions and MIPs were obtained to evaluate the vascular anatomy. CONTRAST:  12mL ISOVUE-370 IOPAMIDOL (ISOVUE-370) INJECTION 76% COMPARISON:  Chest radiograph April 18, 2018 FINDINGS: Cardiovascular: There is no demonstrable pulmonary embolus.  The ascending thoracic aorta has a maximum transverse diameter of 4.2 x 4.2 cm. There is no demonstrable aortic dissection. There are foci atherosclerotic calcification in the proximal visualized great vessels. There is aortic atherosclerosis with irregular plaque throughout the proximal to mid descending thoracic aorta. There are foci of coronary artery calcification. There is no pericardial effusion or pericardial thickening evident. There is left ventricular hypertrophy. Mediastinum/Nodes: No thyroid lesions are evident. There is no appreciable thoracic adenopathy. Several lymph nodes show evident calcification consistent with prior granulomatous disease. There is a small hiatal hernia. Lungs/Pleura: There is underlying bullous emphysematous change. There are scattered calcified granulomas. There is localized scarring with bronchiectatic change in the left upper lobe. There is atelectatic change in the lung bases. There is an area of scarring with mild bronchiectatic change in the left lower lobe. There is no frank edema or consolidation. There is no appreciable pleural effusion. There is epicardial fat prominence along the left and right heart borders. Upper Abdomen: There are calcified splenic granulomas. There is upper abdominal aortic atherosclerosis. Visualized upper abdominal structures otherwise appear unremarkable. Musculoskeletal: There are no blastic or lytic bone lesions. There is no intramuscular lesion. There is degenerative change in each shoulder, somewhat more severe on the left than on the right. Review of the MIP images confirms the above findings. IMPRESSION: 1.  No demonstrable pulmonary embolus. 2. Prominence of the ascending thoracic aorta with a maximum transverse diameter 4.2 x 4.2 cm. No evident dissection. Extensive aortic atherosclerosis with irregular plaque throughout the proximal to mid descending thoracic aorta noted. Foci of coronary artery calcification and great vessel  calcification noted. Recommend semi-annual imaging followup by CTA or MRA and referral to cardiothoracic surgery if not already obtained. This recommendation follows 2010 ACCF/AHA/AATS/ACR/ASA/SCA/SCAI/SIR/STS/SVM Guidelines for the Diagnosis and Management of Patients With Thoracic Aortic Disease. Circulation. 2010; 121: J673-A19. Aortic aneurysm NOS (ICD10-I71.9). 3.  There is left ventricular hypertrophy. 4. Extensive emphysematous change. Areas of cicatrization with associated bronchiectatic change in the left upper and left lower lobe regions. Scattered areas of atelectasis. No frank edema consolidation evident. 5. Evidence of prior granulomatous disease with calcified parenchymal lung granulomas as well as several calcified lymph nodes and splenic calcified granulomas. 6.  No adenopathy by size criteria. 7.  Fairly small hiatal hernia. Aortic Atherosclerosis (ICD10-I70.0) and Emphysema (ICD10-J43.9). Electronically Signed   By: Lowella Grip III M.D.   On: 04/18/2018 10:41   Vas [place] Lower Extremity Venous (dvt) (only Mc & Wl 7a-7p)  Result Date: 04/18/2018  Lower Venous Study Indications: Edema, SOB, and cough.  Comparison Study: No prior study on file Performing Technologist: Sharion Dove RVS  Examination Guidelines: A complete evaluation includes B-mode imaging, spectral Doppler,  color Doppler, and power Doppler as needed of all accessible portions of each vessel. Bilateral testing is considered an integral part of a complete examination. Limited examinations for reoccurring indications may be performed as noted.  Right Venous Findings: +---------+---------------+---------+-----------+----------+-------+          CompressibilityPhasicitySpontaneityPropertiesSummary +---------+---------------+---------+-----------+----------+-------+ CFV      Full           Yes      Yes                          +---------+---------------+---------+-----------+----------+-------+ SFJ      Full                                                  +---------+---------------+---------+-----------+----------+-------+ FV Prox  Full                                                 +---------+---------------+---------+-----------+----------+-------+ FV Mid   Full                                                 +---------+---------------+---------+-----------+----------+-------+ FV DistalFull                                                 +---------+---------------+---------+-----------+----------+-------+ PFV      Full                                                 +---------+---------------+---------+-----------+----------+-------+ POP      Full           Yes      Yes                          +---------+---------------+---------+-----------+----------+-------+ PTV      Full                                                 +---------+---------------+---------+-----------+----------+-------+ PERO     Full                                                 +---------+---------------+---------+-----------+----------+-------+  Left Venous Findings: +---+---------------+---------+-----------+----------+-------+    CompressibilityPhasicitySpontaneityPropertiesSummary +---+---------------+---------+-----------+----------+-------+ CFVFull           Yes      Yes                          +---+---------------+---------+-----------+----------+-------+    Summary: Right: There is no evidence of deep vein thrombosis  in the lower extremity.  *See table(s) above for measurements and observations. Electronically signed by Ruta Hinds MD on 04/18/2018 at 5:22:06 PM.    Final      LOS: 2 days   Oren Binet, MD  [facility]  If 7PM-7AM, please contact night-coverage  Please page via [email or web address]  Go to [email or web address] and use [facility] universal password to access. If you do not have the password, please contact the hospital operator.  Locate the [facility]  provider you are looking for under [facility] and page to a number that you can be directly reached. If you still have difficulty reaching the provider, please page the [facility] (Director on Call) for the Hospitalists listed on amion for assistance.  04/20/2018, 1:54 PM

## 2018-04-21 ENCOUNTER — Encounter (HOSPITAL_COMMUNITY): Payer: Self-pay | Admitting: General Practice

## 2018-04-21 ENCOUNTER — Other Ambulatory Visit: Payer: Self-pay

## 2018-04-21 ENCOUNTER — Inpatient Hospital Stay (HOSPITAL_COMMUNITY): Payer: Medicare Other

## 2018-04-21 DIAGNOSIS — I77810 Thoracic aortic ectasia: Secondary | ICD-10-CM

## 2018-04-21 DIAGNOSIS — J9621 Acute and chronic respiratory failure with hypoxia: Secondary | ICD-10-CM

## 2018-04-21 LAB — BASIC METABOLIC PANEL
Anion gap: 9 (ref 5–15)
BUN: 46 mg/dL — ABNORMAL HIGH (ref 8–23)
CHLORIDE: 105 mmol/L (ref 98–111)
CO2: 28 mmol/L (ref 22–32)
Calcium: 9.3 mg/dL (ref 8.9–10.3)
Creatinine, Ser: 1.58 mg/dL — ABNORMAL HIGH (ref 0.61–1.24)
GFR calc Af Amer: 44 mL/min — ABNORMAL LOW (ref >60)
GFR calc non Af Amer: 38 mL/min — ABNORMAL LOW (ref >60)
Glucose, Bld: 148 mg/dL — ABNORMAL HIGH (ref 70–99)
Potassium: 4.1 mmol/L (ref 3.5–5.1)
Sodium: 142 mmol/L (ref 135–145)

## 2018-04-21 LAB — CBC
HCT: 41.2 % (ref 39.0–52.0)
Hemoglobin: 13.5 g/dL (ref 13.0–17.0)
MCH: 31.4 pg (ref 26.0–34.0)
MCHC: 32.8 g/dL (ref 30.0–36.0)
MCV: 95.8 fL (ref 80.0–100.0)
Platelets: 321 10*3/uL (ref 150–400)
RBC: 4.3 MIL/uL (ref 4.22–5.81)
RDW: 13.8 % (ref 11.5–15.5)
WBC: 16.6 10*3/uL — ABNORMAL HIGH (ref 4.0–10.5)
nRBC: 0 % (ref 0.0–0.2)

## 2018-04-21 LAB — BRAIN NATRIURETIC PEPTIDE: B Natriuretic Peptide: 175.7 pg/mL — ABNORMAL HIGH (ref 0.0–100.0)

## 2018-04-21 MED ORDER — FLUTICASONE PROPIONATE 50 MCG/ACT NA SUSP
2.0000 | Freq: Every day | NASAL | Status: DC
  Administered 2018-04-21 – 2018-04-22 (×2): 2 via NASAL
  Filled 2018-04-21: qty 16

## 2018-04-21 MED ORDER — FUROSEMIDE 10 MG/ML IJ SOLN
60.0000 mg | Freq: Once | INTRAMUSCULAR | Status: AC
  Administered 2018-04-21: 60 mg via INTRAVENOUS
  Filled 2018-04-21: qty 6

## 2018-04-21 MED ORDER — LORATADINE 10 MG PO TABS
10.0000 mg | ORAL_TABLET | Freq: Every day | ORAL | Status: DC
  Administered 2018-04-21 – 2018-04-22 (×2): 10 mg via ORAL
  Filled 2018-04-21 (×2): qty 1

## 2018-04-21 NOTE — Progress Notes (Signed)
Occupational Therapy Treatment Patient Details Name: COWEN PESQUEIRA MRN: [id number] DOB: Jun 13, 1924 Today's Date: 04/21/2018    History of present illness RAMONA SLINGER is a 85 y.o. male with medical history significant of COPD, chronic hypoxic respiratory failure on 2 L of oxygen at home, hypertension, atrial fibrillation, chronic kidney disease stage III who presents to the emergency room with shortness of breath for the last few days.   OT comments  Pt progressing toward established OT goals. Pt currently requires S-minguard assist for ADL and functional mobility. Pt demonstrated decreased activity tolerance DoE 3/4 after completing ADL task standing at sink for 28mn. Pt 5 lnc upon arrival, SpO2 89%-94% throughout session with vc for pursed lip breathing. Pt will continue to benefit from skilled OT services to maximize independence and safety with ADL and functional mobility to allow safe d/c to venue listed below.    Follow Up Recommendations  SNF;Supervision - Intermittent(Pt requesting [facility] in [place])    Equipment Recommendations  Other (comment)(defer to next venue)    Recommendations for Other Services PT consult    Precautions / Restrictions Precautions Precautions: Fall Precaution Comments: pt has fallen one time at his home over his O2 cord and has used RW in house since then Restrictions Weight Bearing Restrictions: No       Mobility Bed Mobility Overal bed mobility: Needs Assistance Bed Mobility: Supine to Sit     Supine to sit: Supervision;HOB elevated        Transfers Overall transfer level: Needs assistance Equipment used: None Transfers: Sit to/from Stand Sit to Stand: Supervision         General transfer comment: S for safety.vc for safe hand placement    Balance Overall balance assessment: Mild deficits observed, not formally tested                                         ADL either performed or assessed with clinical  judgement   ADL Overall ADL's : Needs assistance/impaired     Grooming: Wash/dry hands;Standing;Supervision/safety                   Toilet Transfer: Min guard;Ambulation Toilet Transfer Details (indicate cue type and reason): pt managed lines during ambulation, minguard for safety and stability          Functional mobility during ADLs: Min guard General ADL Comments: minguard for safety and stability with line management during functional mobility;pt reliant on external surface for stability during ADL     Vision       Perception     Praxis      Cognition Arousal/Alertness: Awake/alert Behavior During Therapy: WFL for tasks assessed/performed Overall Cognitive Status: No family/caregiver present to determine baseline cognitive functioning                                 General Comments: required vc for first task in 2 step sequence         Exercises     Shoulder Instructions       General Comments pt enjoys telling about past life experiences of how he met his wife;pt educated on importance of mobility and sitting upright in chair    Pertinent Vitals/ Pain          Home Living Family/patient expects to be discharged to:: Other (  Comment)(REHAB    [facility]) Living Arrangements: Alone                                      Prior Functioning/Environment              Frequency  Min 2X/week        Progress Toward Goals  OT Goals(current goals can now be found in the care plan section)  Progress towards OT goals: Progressing toward goals  Acute Rehab OT Goals Patient Stated Goal: get stronger at rehab  OT Goal Formulation: With patient Time For Goal Achievement: 04/28/18 Potential to Achieve Goals: Good ADL Goals Pt Will Perform Lower Body Bathing: with modified independence Pt Will Perform Lower Body Dressing: with modified independence Pt Will Transfer to Toilet: with modified independence Pt Will Perform  Toileting - Clothing Manipulation and hygiene: with modified independence Pt Will Perform Tub/Shower Transfer: with modified independence  Plan Discharge plan remains appropriate    Co-evaluation                 AM-PAC OT "6 Clicks" Daily Activity     Outcome Measure   Help from another person eating meals?: None Help from another person taking care of personal grooming?: A Little Help from another person toileting, which includes using toliet, bedpan, or urinal?: A Little Help from another person bathing (including washing, rinsing, drying)?: A Little Help from another person to put on and taking off regular upper body clothing?: A Little Help from another person to put on and taking off regular lower body clothing?: A Little 6 Click Score: 19    End of Session Equipment Utilized During Treatment: Oxygen  OT Visit Diagnosis: Unsteadiness on feet (R26.81);Muscle weakness (generalized) (M62.81)   Activity Tolerance Patient tolerated treatment well   Patient Left in chair;with call bell/phone within reach;with chair alarm set;with nursing/sitter in room   Nurse Communication Mobility status        Time: 8280-0349 OT Time Calculation (min): 19 min  Charges: OT General Charges $OT Visit: 1 Visit OT Treatments $Self Care/Home Management : 8-22 mins  Dorinda Hill OTR/L [place] Office: [place] 04/21/2018, 12:44 PM

## 2018-04-21 NOTE — Care Management Important Message (Signed)
Important Message  Patient Details  Name: Trevor Pitts MRN: [id number] Date of Birth: September 27, 1924   Medicare Important Message Given:  Yes    Julionna Marczak [address] 04/21/2018, 3:05 PM

## 2018-04-21 NOTE — Progress Notes (Signed)
RT came to patient room to do AM treatment.  Noted that patient was on 8L on a regular nasal cannula.  Decreased flow to 6L nasal cannula and sats maintained at 92%.  Gave patient nebulizer treatment and placed patient on humidity once treatment was completed still on 6L.  Notified floor RT.  Patient tolerating well.

## 2018-04-21 NOTE — Progress Notes (Signed)
PROGRESS NOTE        PATIENT DETAILS Name: Trevor Pitts Age: 85 y.o. Sex: male Date of Birth: [birth year] Admit Date: 04/18/2018 Admitting Physician Barb Merino, MD QIH:[id number], Mauro Kaufmann, MD  Brief Narrative: Patient is a 85 y.o. male with history of chronic hypoxic respiratory failure on home O2, COPD who presented with several days history of productive cough, worsening shortness of breath, found to have COPD exacerbation and admitted to the hospitalist service.  See below for further details.  Subjective: Developed worsening shortness of breath last night-now on 8-9 L of oxygen when I walked in.  Still wheezing-heard without stethoscope.  Denies any chest pain, nausea, vomiting.  Assessment/Plan: COPD exacerbation: Initially improved, but worsened last night-more oxygen requirements this morning.  Continue current dosing of IV steroids, continue bronchodilators and empiric antimicrobial therapy.  We will give 1 dose of Lasix-although patient does not have any signs of volume overload.  Plans were to discharge to SNF today-obviously this is now on hold-we will reassess tomorrow.  Will instruct nursing staff to titrate down oxygen to his usual home regimen of 2 L.  Have encouraged patient (nursing staff away) that he needs to use incentive spirometry/flutter valve.  We will continue to closely monitor.  Acute on chronic hypoxic respiratory failure: Secondary to above-see above.  Is on 2 L of oxygen at home at all times.  CKD stage III: Creatinine close to usual baseline.  Follow.  PAF: Rate controlled with metoprolol-continue aspirin.  Suspect not on anticoagulation as an outpatient due to advanced age and frailty  GERD: Continue PPI  Hypertension: Controlled-continue benazepril-hold HCTZ and will give 1 dose of IV Lasix today.  Dyslipidemia: Continue statin  Deconditioning: Secondary to COPD exacerbation-underlying frailty-PT evaluation  completed-recommendations are for SNF on discharge.  Ascending aortic aneurysm 4.2 x 4.2 cm: Stable for outpatient follow-up with semi-annual imaging  DVT Prophylaxis: Prophylactic Lovenox   Code Status: DNR  Family Communication: None at bedside  Disposition Plan: Remain inpatient-hopefully to SNF in the next 1-2 days depending on clinical improvement.  Antimicrobial agents: Anti-infectives (From admission, onward)   Start     Dose/Rate Route Frequency Ordered Stop   04/19/18 1730  azithromycin (ZITHROMAX) tablet 500 mg     500 mg Oral Daily 04/18/18 1620 04/23/18 0959   04/18/18 1730  azithromycin (ZITHROMAX) 500 mg in sodium chloride 0.9 % 250 mL IVPB     500 mg 250 mL/hr over 60 Minutes Intravenous Every 24 hours 04/18/18 1620 04/18/18 1906      Procedures: None  CONSULTS:  None  Time spent: 25- minutes-Greater than 50% of this time was spent in counseling, explanation of diagnosis, planning of further management, and coordination of care.  MEDICATIONS: Scheduled Meds: . acidophilus  1 capsule Oral Daily  . ALPRAZolam  0.25 mg Oral TID  . aspirin EC  81 mg Oral Daily  . atorvastatin  40 mg Oral QODAY  . azithromycin  500 mg Oral Daily  . benazepril  40 mg Oral BID  . budesonide  0.5 mg Nebulization BID  . donepezil  10 mg Oral QHS  . enoxaparin (LOVENOX) injection  30 mg Subcutaneous Q24H  . famotidine  20 mg Oral QHS  . feeding supplement (ENSURE ENLIVE)  237 mL Oral BID BM  . furosemide  60 mg Intravenous Once  . guaiFENesin  600  mg Oral BID  . ipratropium-albuterol  3 mL Nebulization Q6H  . methylPREDNISolone (SOLU-MEDROL) injection  40 mg Intravenous Q8H  . metoprolol tartrate  50 mg Oral Daily  . multivitamin with minerals  1 tablet Oral Daily   Continuous Infusions: PRN Meds:.acetaminophen **OR** acetaminophen, albuterol   PHYSICAL EXAM: Vital signs: Vitals:   04/21/18 0516 04/21/18 0756 04/21/18 0824 04/21/18 0826  BP: 131/79     Pulse: 85      Resp: 16     Temp: 97.7 F (36.5 C)     TempSrc: Oral     SpO2: 94% 91% 94% 91%  Weight:      Height:       Filed Weights   04/18/18 0845 04/18/18 1547 04/19/18 0417  Weight: 74.8 kg 70.1 kg 69.8 kg   Body mass index is 24.09 kg/m.   General appearance:Awake, alert, not in any distress.  Eyes:no scleral icterus. HEENT: Atraumatic and Normocephalic Neck: supple, no JVD. Resp:Good air entry bilaterally, coarse rhonchi all over CVS: S1 S2 regular, no murmurs.  GI: Bowel sounds present, Non tender and not distended with no gaurding, rigidity or rebound. Extremities: B/L Lower Ext shows no edema, both legs are warm to touch Neurology:  Non focal Psychiatric: Normal judgment and insight. Normal mood. Musculoskeletal:No digital cyanosis Skin:No Rash, warm and dry Wounds:N/A  I have personally reviewed following labs and imaging studies  LABORATORY DATA: CBC: Recent Labs  Lab 04/18/18 0845 04/19/18 0330 04/21/18 0708  WBC 12.4* 12.4* 16.6*  HGB 14.4 12.6* 13.5  HCT 44.5 39.3 41.2  MCV 97.4 96.3 95.8  PLT 341 287 188    Basic Metabolic Panel: Recent Labs  Lab 04/18/18 0845 04/19/18 0330 04/21/18 0708  NA 145 143 142  K 3.9 4.3 4.1  CL 107 109 105  CO2 27 24 28   GLUCOSE 111* 131* 148*  BUN 41* 43* 46*  CREATININE 1.85* 1.69* 1.58*  CALCIUM 9.6 8.6* 9.3    GFR: Estimated Creatinine Clearance: 28.5 mL/min (A) (by C-G formula based on SCr of 1.58 mg/dL (H)).  Liver Function Tests: No results for input(s): AST, ALT, ALKPHOS, BILITOT, PROT, ALBUMIN in the last 168 hours. No results for input(s): LIPASE, AMYLASE in the last 168 hours. No results for input(s): AMMONIA in the last 168 hours.  Coagulation Profile: No results for input(s): INR, PROTIME in the last 168 hours.  Cardiac Enzymes: No results for input(s): CKTOTAL, CKMB, CKMBINDEX, TROPONINI in the last 168 hours.  BNP (last 3 results) No results for input(s): PROBNP in the last 8760  hours.  HbA1C: No results for input(s): HGBA1C in the last 72 hours.  CBG: No results for input(s): GLUCAP in the last 168 hours.  Lipid Profile: No results for input(s): CHOL, HDL, LDLCALC, TRIG, CHOLHDL, LDLDIRECT in the last 72 hours.  Thyroid Function Tests: No results for input(s): TSH, T4TOTAL, FREET4, T3FREE, THYROIDAB in the last 72 hours.  Anemia Panel: No results for input(s): VITAMINB12, FOLATE, FERRITIN, TIBC, IRON, RETICCTPCT in the last 72 hours.  Urine analysis:    Component Value Date/Time   COLORURINE YELLOW 06/30/2015 [place] 06/30/2015 1227   LABSPEC 1.020 06/30/2015 1227   LABSPEC 1.020 10/16/2010 1539   PHURINE 6.0 06/30/2015 1227   GLUCOSEU NEGATIVE 06/30/2015 1227   GLUCOSEU NEGATIVE 12/14/2013 1224   HGBUR NEGATIVE 06/30/2015 1227   BILIRUBINUR NEGATIVE 06/30/2015 1227   BILIRUBINUR Negative 10/16/2010 1539   KETONESUR NEGATIVE 06/30/2015 1227   PROTEINUR NEGATIVE 06/30/2015 1227  UROBILINOGEN 0.2 12/14/2013 1224   NITRITE NEGATIVE 06/30/2015 1227   LEUKOCYTESUR NEGATIVE 06/30/2015 1227   LEUKOCYTESUR Trace 10/16/2010 1539    Sepsis Labs: Lactic Acid, Venous No results found for: LATICACIDVEN  MICROBIOLOGY: No results found for this or any previous visit (from the past 240 hour(s)).  RADIOLOGY STUDIES/RESULTS: Dg Chest 2 View  Result Date: 04/18/2018 CLINICAL DATA:  85 year old with chest pain. Weakness and shortness of breath. EXAM: CHEST - 2 VIEW COMPARISON:  03/22/2018 FINDINGS: Chronic hyperinflation and findings are suggestive for emphysematous disease. Cardiomediastinal silhouette is stable. Again noted are densities in the right lower chest but concern for acute on chronic disease. No other evidence for focal new lung densities. No large pleural effusions. Atherosclerotic calcifications at the aortic arch. IMPRESSION: Densities in the right lower chest are concerning for acute on chronic disease. Cannot exclude  infectious etiology at the right lung base. Stable hyperinflation and emphysematous disease. Electronically Signed   By: Markus Daft M.D.   On: 04/18/2018 09:43   Ct Angio Chest Pe W/cm &/or Wo Cm  Result Date: 04/18/2018 CLINICAL DATA:  Shortness of breath and cough EXAM: CT ANGIOGRAPHY CHEST WITH CONTRAST TECHNIQUE: Multidetector CT imaging of the chest was performed using the standard protocol during bolus administration of intravenous contrast. Multiplanar CT image reconstructions and MIPs were obtained to evaluate the vascular anatomy. CONTRAST:  45mL ISOVUE-370 IOPAMIDOL (ISOVUE-370) INJECTION 76% COMPARISON:  Chest radiograph April 18, 2018 FINDINGS: Cardiovascular: There is no demonstrable pulmonary embolus. The ascending thoracic aorta has a maximum transverse diameter of 4.2 x 4.2 cm. There is no demonstrable aortic dissection. There are foci atherosclerotic calcification in the proximal visualized great vessels. There is aortic atherosclerosis with irregular plaque throughout the proximal to mid descending thoracic aorta. There are foci of coronary artery calcification. There is no pericardial effusion or pericardial thickening evident. There is left ventricular hypertrophy. Mediastinum/Nodes: No thyroid lesions are evident. There is no appreciable thoracic adenopathy. Several lymph nodes show evident calcification consistent with prior granulomatous disease. There is a small hiatal hernia. Lungs/Pleura: There is underlying bullous emphysematous change. There are scattered calcified granulomas. There is localized scarring with bronchiectatic change in the left upper lobe. There is atelectatic change in the lung bases. There is an area of scarring with mild bronchiectatic change in the left lower lobe. There is no frank edema or consolidation. There is no appreciable pleural effusion. There is epicardial fat prominence along the left and right heart borders. Upper Abdomen: There are calcified splenic  granulomas. There is upper abdominal aortic atherosclerosis. Visualized upper abdominal structures otherwise appear unremarkable. Musculoskeletal: There are no blastic or lytic bone lesions. There is no intramuscular lesion. There is degenerative change in each shoulder, somewhat more severe on the left than on the right. Review of the MIP images confirms the above findings. IMPRESSION: 1.  No demonstrable pulmonary embolus. 2. Prominence of the ascending thoracic aorta with a maximum transverse diameter 4.2 x 4.2 cm. No evident dissection. Extensive aortic atherosclerosis with irregular plaque throughout the proximal to mid descending thoracic aorta noted. Foci of coronary artery calcification and great vessel calcification noted. Recommend semi-annual imaging followup by CTA or MRA and referral to cardiothoracic surgery if not already obtained. This recommendation follows 2010 ACCF/AHA/AATS/ACR/ASA/SCA/SCAI/SIR/STS/SVM Guidelines for the Diagnosis and Management of Patients With Thoracic Aortic Disease. Circulation. 2010; 121: F643-P29. Aortic aneurysm NOS (ICD10-I71.9). 3.  There is left ventricular hypertrophy. 4. Extensive emphysematous change. Areas of cicatrization with associated bronchiectatic change in the left upper  and left lower lobe regions. Scattered areas of atelectasis. No frank edema consolidation evident. 5. Evidence of prior granulomatous disease with calcified parenchymal lung granulomas as well as several calcified lymph nodes and splenic calcified granulomas. 6.  No adenopathy by size criteria. 7.  Fairly small hiatal hernia. Aortic Atherosclerosis (ICD10-I70.0) and Emphysema (ICD10-J43.9). Electronically Signed   By: Lowella Grip III M.D.   On: 04/18/2018 10:41   Dg Chest Port 1 View  Result Date: 04/21/2018 CLINICAL DATA:  Shortness of breath, cough EXAM: PORTABLE CHEST 1 VIEW COMPARISON:  04/18/2018 FINDINGS: There is hyperinflation of the lungs compatible with COPD. Heart is  normal size. Bibasilar scarring or atelectasis. Small left pleural effusion. No acute bony abnormality. IMPRESSION: COPD.  Bibasilar atelectasis or scarring and small left effusion. Electronically Signed   By: Rolm Baptise M.D.   On: 04/21/2018 03:07   Vas [place] Lower Extremity Venous (dvt) (only Mc & Wl 7a-7p)  Result Date: 04/18/2018  Lower Venous Study Indications: Edema, SOB, and cough.  Comparison Study: No prior study on file Performing Technologist: Sharion Dove RVS  Examination Guidelines: A complete evaluation includes B-mode imaging, spectral Doppler, color Doppler, and power Doppler as needed of all accessible portions of each vessel. Bilateral testing is considered an integral part of a complete examination. Limited examinations for reoccurring indications may be performed as noted.  Right Venous Findings: +---------+---------------+---------+-----------+----------+-------+          CompressibilityPhasicitySpontaneityPropertiesSummary +---------+---------------+---------+-----------+----------+-------+ CFV      Full           Yes      Yes                          +---------+---------------+---------+-----------+----------+-------+ SFJ      Full                                                 +---------+---------------+---------+-----------+----------+-------+ FV Prox  Full                                                 +---------+---------------+---------+-----------+----------+-------+ FV Mid   Full                                                 +---------+---------------+---------+-----------+----------+-------+ FV DistalFull                                                 +---------+---------------+---------+-----------+----------+-------+ PFV      Full                                                 +---------+---------------+---------+-----------+----------+-------+ POP      Full           Yes      Yes                           +---------+---------------+---------+-----------+----------+-------+  PTV      Full                                                 +---------+---------------+---------+-----------+----------+-------+ PERO     Full                                                 +---------+---------------+---------+-----------+----------+-------+  Left Venous Findings: +---+---------------+---------+-----------+----------+-------+    CompressibilityPhasicitySpontaneityPropertiesSummary +---+---------------+---------+-----------+----------+-------+ CFVFull           Yes      Yes                          +---+---------------+---------+-----------+----------+-------+    Summary: Right: There is no evidence of deep vein thrombosis in the lower extremity.  *See table(s) above for measurements and observations. Electronically signed by Ruta Hinds MD on 04/18/2018 at 5:22:06 PM.    Final      LOS: 3 days   Oren Binet, MD  [facility]  If 7PM-7AM, please contact night-coverage  Please page via [email or web address]  Go to [email or web address] and use [facility] universal password to access. If you do not have the password, please contact the hospital operator.  Locate the [facility] provider you are looking for under [facility] and page to a number that you can be directly reached. If you still have difficulty reaching the provider, please page the [facility] (Director on Call) for the Hospitalists listed on amion for assistance.  04/21/2018, 9:24 AM

## 2018-04-21 NOTE — Progress Notes (Signed)
MD notified of chest x-ray results.

## 2018-04-21 NOTE — Consult Note (Signed)
            [facility] CM Primary Care Navigator  04/21/2018  Trevor Pitts 1924-10-15 [id number]   Seenpatientat the bedsidetoidentify possible discharge needs.  Patient reports having "productive coughing for several days, wheezing and worsening shortness of breath inspite oxygen use and breathing treatments" which had led to this admission and he was found to have COPD exacerbation.  Patientendorses Dr. Merrilee Pitts with [place] care provider.He mentioned having a pulmonary doctor (Dr. Gwenette Pitts) that follows him up from [facility].   [facility] [place] and [place] [facility] to obtain medicationswithout difficulty.  Patient reports that his daughter in-law Trevor Pitts) has been managing hismedicationsat home using"pillbox" system filledevery week.  Patientmentioned that he has been driving prior to admission but his sons Trevor Pitts or Trevor Pitts) will be able to provide transportation tohisdoctors' appointments after discharge.  Patientlivesalone and independent with care prior to admission, but his sons provide assistance with his care needs whenever needed.   Anticipated plan for discharge isskilled nursing facility (SNF- in process)for rehabilitationper therapy recommendation.  Patientvoiced understandingto callprimarycareprovider'soffice once hereturnsback home,for a post discharge follow-upvisitwithin1- 2 weeks or sooner if needs arise.Patient letter (with PCP's contact number) was provided asareminder.  Explained topatient about[facility] CM services available for health management andresourcesat homeand he expressed interest about it.  Patient plans to discuss with primary care provider on his next visit, forfurther needsand assistancein managing hishealth conditions- oncehe is dischargeto home.  Patientexpressedunderstandingto seekreferral from  primary care provider to [facility] care management ifdeemed necessary and appropriate for any servicesin thenearfuture.  [facility] care management information was provided for futureneeds thathemay have.  Primary care provider's office is listed as providing transition of care (TOC).   Lejla Moeser A. Ignace Mandigo, BSN, RN-BC [facility] PRIMARY CARE Navigator Cell: [phone]

## 2018-04-21 NOTE — Progress Notes (Signed)
RRT reported pt to have increased SOB and sounds to have increased fluid in lungs. RRT recommended repeat chest x-ray. MD paged. Will continue to treat and monitor and according to MD orders.

## 2018-04-22 DIAGNOSIS — F0390 Unspecified dementia without behavioral disturbance: Secondary | ICD-10-CM | POA: Diagnosis not present

## 2018-04-22 DIAGNOSIS — R0689 Other abnormalities of breathing: Secondary | ICD-10-CM | POA: Diagnosis not present

## 2018-04-22 DIAGNOSIS — Z66 Do not resuscitate: Secondary | ICD-10-CM | POA: Diagnosis present

## 2018-04-22 DIAGNOSIS — R1310 Dysphagia, unspecified: Secondary | ICD-10-CM | POA: Diagnosis not present

## 2018-04-22 DIAGNOSIS — J440 Chronic obstructive pulmonary disease with acute lower respiratory infection: Secondary | ICD-10-CM | POA: Diagnosis present

## 2018-04-22 DIAGNOSIS — Z79899 Other long term (current) drug therapy: Secondary | ICD-10-CM | POA: Diagnosis not present

## 2018-04-22 DIAGNOSIS — E46 Unspecified protein-calorie malnutrition: Secondary | ICD-10-CM | POA: Diagnosis not present

## 2018-04-22 DIAGNOSIS — J9691 Respiratory failure, unspecified with hypoxia: Secondary | ICD-10-CM | POA: Diagnosis not present

## 2018-04-22 DIAGNOSIS — I10 Essential (primary) hypertension: Secondary | ICD-10-CM | POA: Diagnosis present

## 2018-04-22 DIAGNOSIS — R0602 Shortness of breath: Secondary | ICD-10-CM | POA: Diagnosis not present

## 2018-04-22 DIAGNOSIS — A419 Sepsis, unspecified organism: Secondary | ICD-10-CM | POA: Diagnosis present

## 2018-04-22 DIAGNOSIS — J441 Chronic obstructive pulmonary disease with (acute) exacerbation: Secondary | ICD-10-CM | POA: Diagnosis present

## 2018-04-22 DIAGNOSIS — J9611 Chronic respiratory failure with hypoxia: Secondary | ICD-10-CM | POA: Diagnosis not present

## 2018-04-22 DIAGNOSIS — R262 Difficulty in walking, not elsewhere classified: Secondary | ICD-10-CM | POA: Diagnosis not present

## 2018-04-22 DIAGNOSIS — I2510 Atherosclerotic heart disease of native coronary artery without angina pectoris: Secondary | ICD-10-CM | POA: Diagnosis present

## 2018-04-22 DIAGNOSIS — K219 Gastro-esophageal reflux disease without esophagitis: Secondary | ICD-10-CM | POA: Diagnosis not present

## 2018-04-22 DIAGNOSIS — D649 Anemia, unspecified: Secondary | ICD-10-CM | POA: Diagnosis present

## 2018-04-22 DIAGNOSIS — Z8701 Personal history of pneumonia (recurrent): Secondary | ICD-10-CM | POA: Diagnosis not present

## 2018-04-22 DIAGNOSIS — N182 Chronic kidney disease, stage 2 (mild): Secondary | ICD-10-CM | POA: Diagnosis not present

## 2018-04-22 DIAGNOSIS — I739 Peripheral vascular disease, unspecified: Secondary | ICD-10-CM | POA: Diagnosis not present

## 2018-04-22 DIAGNOSIS — Z882 Allergy status to sulfonamides status: Secondary | ICD-10-CM | POA: Diagnosis not present

## 2018-04-22 DIAGNOSIS — R300 Dysuria: Secondary | ICD-10-CM | POA: Diagnosis not present

## 2018-04-22 DIAGNOSIS — J9621 Acute and chronic respiratory failure with hypoxia: Secondary | ICD-10-CM | POA: Diagnosis present

## 2018-04-22 DIAGNOSIS — R197 Diarrhea, unspecified: Secondary | ICD-10-CM | POA: Diagnosis present

## 2018-04-22 DIAGNOSIS — R079 Chest pain, unspecified: Secondary | ICD-10-CM | POA: Diagnosis not present

## 2018-04-22 DIAGNOSIS — I480 Paroxysmal atrial fibrillation: Secondary | ICD-10-CM | POA: Diagnosis not present

## 2018-04-22 DIAGNOSIS — J9601 Acute respiratory failure with hypoxia: Secondary | ICD-10-CM | POA: Diagnosis not present

## 2018-04-22 DIAGNOSIS — F0391 Unspecified dementia with behavioral disturbance: Secondary | ICD-10-CM | POA: Diagnosis not present

## 2018-04-22 DIAGNOSIS — I471 Supraventricular tachycardia: Secondary | ICD-10-CM | POA: Diagnosis not present

## 2018-04-22 DIAGNOSIS — J310 Chronic rhinitis: Secondary | ICD-10-CM | POA: Diagnosis not present

## 2018-04-22 DIAGNOSIS — N170 Acute kidney failure with tubular necrosis: Secondary | ICD-10-CM | POA: Diagnosis present

## 2018-04-22 DIAGNOSIS — I712 Thoracic aortic aneurysm, without rupture: Secondary | ICD-10-CM | POA: Diagnosis not present

## 2018-04-22 DIAGNOSIS — Z7982 Long term (current) use of aspirin: Secondary | ICD-10-CM | POA: Diagnosis not present

## 2018-04-22 DIAGNOSIS — N179 Acute kidney failure, unspecified: Secondary | ICD-10-CM | POA: Diagnosis not present

## 2018-04-22 DIAGNOSIS — R0902 Hypoxemia: Secondary | ICD-10-CM | POA: Diagnosis not present

## 2018-04-22 DIAGNOSIS — E785 Hyperlipidemia, unspecified: Secondary | ICD-10-CM | POA: Diagnosis present

## 2018-04-22 DIAGNOSIS — R6521 Severe sepsis with septic shock: Secondary | ICD-10-CM | POA: Diagnosis present

## 2018-04-22 DIAGNOSIS — Z87891 Personal history of nicotine dependence: Secondary | ICD-10-CM | POA: Diagnosis not present

## 2018-04-22 DIAGNOSIS — R0603 Acute respiratory distress: Secondary | ICD-10-CM | POA: Diagnosis not present

## 2018-04-22 DIAGNOSIS — F419 Anxiety disorder, unspecified: Secondary | ICD-10-CM | POA: Diagnosis present

## 2018-04-22 DIAGNOSIS — Z515 Encounter for palliative care: Secondary | ICD-10-CM | POA: Diagnosis present

## 2018-04-22 DIAGNOSIS — R109 Unspecified abdominal pain: Secondary | ICD-10-CM | POA: Diagnosis not present

## 2018-04-22 DIAGNOSIS — J189 Pneumonia, unspecified organism: Secondary | ICD-10-CM | POA: Diagnosis present

## 2018-04-22 DIAGNOSIS — N183 Chronic kidney disease, stage 3 (moderate): Secondary | ICD-10-CM | POA: Diagnosis not present

## 2018-04-22 LAB — BASIC METABOLIC PANEL
Anion gap: 12 (ref 5–15)
BUN: 63 mg/dL — ABNORMAL HIGH (ref 8–23)
CO2: 30 mmol/L (ref 22–32)
Calcium: 9.5 mg/dL (ref 8.9–10.3)
Chloride: 99 mmol/L (ref 98–111)
Creatinine, Ser: 1.73 mg/dL — ABNORMAL HIGH (ref 0.61–1.24)
GFR calc Af Amer: 39 mL/min — ABNORMAL LOW (ref >60)
GFR, EST NON AFRICAN AMERICAN: 34 mL/min — AB (ref >60)
Glucose, Bld: 139 mg/dL — ABNORMAL HIGH (ref 70–99)
Potassium: 4 mmol/L (ref 3.5–5.1)
Sodium: 141 mmol/L (ref 135–145)

## 2018-04-22 MED ORDER — PREDNISONE 10 MG PO TABS: ORAL_TABLET | ORAL | 0 refills | Status: AC

## 2018-04-22 MED ORDER — ALPRAZOLAM 0.25 MG PO TABS: 0.2500 mg | ORAL_TABLET | Freq: Three times a day (TID) | ORAL | 0 refills | Status: AC

## 2018-04-22 MED ORDER — IPRATROPIUM-ALBUTEROL 0.5-2.5 (3) MG/3ML IN SOLN: 3.0000 mL | RESPIRATORY_TRACT | Status: AC | PRN

## 2018-04-22 MED ORDER — GUAIFENESIN ER 600 MG PO TB12: 600.0000 mg | ORAL_TABLET | Freq: Two times a day (BID) | ORAL | Status: AC

## 2018-04-22 MED ORDER — LORATADINE 10 MG PO TABS: 10.0000 mg | ORAL_TABLET | Freq: Every day | ORAL | Status: AC

## 2018-04-22 MED ORDER — ALBUTEROL SULFATE (2.5 MG/3ML) 0.083% IN NEBU: 2.5000 mg | INHALATION_SOLUTION | RESPIRATORY_TRACT | 12 refills | Status: AC | PRN

## 2018-04-22 MED ORDER — FUROSEMIDE 10 MG/ML IJ SOLN
60.0000 mg | Freq: Once | INTRAMUSCULAR | Status: AC
  Administered 2018-04-22: 60 mg via INTRAVENOUS
  Filled 2018-04-22: qty 6

## 2018-04-22 MED ORDER — FUROSEMIDE 40 MG PO TABS: 60.0000 mg | ORAL_TABLET | Freq: Every day | ORAL | 11 refills | Status: AC

## 2018-04-22 MED ORDER — METOPROLOL TARTRATE 50 MG PO TABS: 50.0000 mg | ORAL_TABLET | Freq: Every day | ORAL | Status: AC

## 2018-04-22 MED ORDER — FLUTICASONE PROPIONATE 50 MCG/ACT NA SUSP: 2.0000 | Freq: Every day | NASAL | 2 refills | Status: AC

## 2018-04-22 MED ORDER — ENSURE ENLIVE PO LIQD: 237.0000 mL | Freq: Two times a day (BID) | ORAL | 12 refills | Status: AC

## 2018-04-22 NOTE — Discharge Summary (Signed)
PATIENT DETAILS Name: Trevor Pitts Age: 85 y.o. Sex: male Date of Birth: [birth year] MRN: [id number]. Admitting Physician: Barb Merino, MD DPO:[id number], Mauro Kaufmann, MD  Admit Date: 04/18/2018 Discharge date: 04/22/2018  Recommendations for Outpatient Follow-up:  1. Follow up with PCP in 1-2 weeks 2. Please obtain BMP/CBC in one week  Admitted From:  Home  Disposition: SNF   Home Health: No  Equipment/Devices: None  Discharge Condition: Stable  CODE STATUS: FULL CODE  Diet recommendation:  Heart Healthy   Brief Summary: See H&P, Labs, Consult and Test reports for all details in brief,Patient is a 85 y.o. male with history of chronic hypoxic respiratory failure on home O2, COPD who presented with several days history of productive cough, worsening shortness of breath, found to have COPD exacerbation and admitted to the hospitalist service.  See below for further details.  Brief Hospital Course: COPD exacerbation: Much improved with steroids, scheduled bronchodilators and empiric Zithromax.  He is much better today-and claimed that he is at baseline, and is very anxious to be discharged today.  He claims that his son will take him to SNF.  He is back on 2 L of oxygen this morning.  Yesterday he had a brief episode of bronchospasm and worsening respiratory failure.  On discharge-he will be on tapering prednisone, continue his bronchodilator regimen-and have encouraged continued use of incentive spirometry and flutter valve.  Acute on chronic hypoxic respiratory failure: Secondary to above-see above.  Is on 2 L of oxygen at home at all times.  CKD stage III: Creatinine close to usual baseline.  Follow.  PAF: Rate controlled with metoprolol-continue aspirin.  Suspect not on anticoagulation as an outpatient due to advanced age and frailty  GERD: Continue PPI  Hypertension: Controlled-continue benazepril, metoprolol-we will hold HCTZ and switch to  Lasix.  Dyslipidemia: Continue statin  Deconditioning: Secondary to COPD exacerbation-underlying frailty-PT evaluation completed-recommendations are for SNF on discharge.  Ascending aortic aneurysm 4.2 x 4.2 cm: Stable for outpatient follow-up with semi-annual imaging  Procedures/Studies: None  Discharge Diagnoses:  Principal Problem:   COPD with acute exacerbation ([place]) Active Problems:   HLD (hyperlipidemia)   Benign essential HTN   COPD GOLD III   Chronic respiratory failure with hypoxia (HCC)   PAF (paroxysmal atrial fibrillation) (HCC)   CKD (chronic kidney disease) stage 2, GFR 60-89 ml/min   Gastroesophageal reflux disease   Discharge Instructions:  Activity:  As tolerated with Full fall precautions use walker/cane & assistance as needed  Discharge Instructions    Diet - low sodium heart healthy   Complete by:  As directed    Discharge instructions   Complete by:  As directed    Follow with Primary MD  Merrilee Seashore, MD in 1 week  Please get a complete blood count and chemistry panel checked by your Primary MD at your next visit, and again as instructed by your Primary MD.  Get Medicines reviewed and adjusted: Please take all your medications with you for your next visit with your Primary MD  Laboratory/radiological data: Please request your Primary MD to go over all hospital tests and procedure/radiological results at the follow up, please ask your Primary MD to get all Hospital records sent to his/her office.  In some cases, they will be blood work, cultures and biopsy results pending at the time of your discharge. Please request that your primary care M.D. follows up on these results.  Also Note the following: If you experience worsening of your admission symptoms, develop  shortness of breath, life threatening emergency, suicidal or homicidal thoughts you must seek medical attention immediately by calling 911 or calling your MD immediately  if  symptoms less severe.  You must read complete instructions/literature along with all the possible adverse reactions/side effects for all the Medicines you take and that have been prescribed to you. Take any new Medicines after you have completely understood and accpet all the possible adverse reactions/side effects.   Do not drive when taking Pain medications or sleeping medications (Benzodaizepines)  Do not take more than prescribed Pain, Sleep and Anxiety Medications. It is not advisable to combine anxiety,sleep and pain medications without talking with your primary care practitioner  Special Instructions: If you have smoked or chewed Tobacco  in the last 2 yrs please stop smoking, stop any regular Alcohol  and or any Recreational drug use.  Wear Seat belts while driving.  Please note: You were cared for by a hospitalist during your hospital stay. Once you are discharged, your primary care physician will handle any further medical issues. Please note that NO REFILLS for any discharge medications will be authorized once you are discharged, as it is imperative that you return to your primary care physician (or establish a relationship with a primary care physician if you do not have one) for your post hospital discharge needs so that they can reassess your need for medications and monitor your lab values.   Increase activity slowly   Complete by:  As directed      Allergies as of 04/22/2018      Reactions   Sulfa Antibiotics Other (See Comments)   unspecified reaction      Medication List    STOP taking these medications   metoprolol-hydrochlorothiazide 50-25 MG tablet Commonly known as:  LOPRESSOR HCT     TAKE these medications   albuterol (2.5 MG/3ML) 0.083% nebulizer solution Commonly known as:  PROVENTIL Take 3 mLs (2.5 mg total) by nebulization every 2 (two) hours as needed for wheezing.   ALPRAZolam 0.25 MG tablet Commonly known as:  XANAX Take 1 tablet (0.25 mg total) by  mouth 3 (three) times daily.   arformoterol 15 MCG/2ML Nebu Commonly known as:  BROVANA Take 15 mcg by nebulization 2 (two) times daily.   aspirin 81 MG tablet Take 81 mg by mouth daily.   atorvastatin 80 MG tablet Commonly known as:  LIPITOR Take 40 mg by mouth every other day.   benazepril 40 MG tablet Commonly known as:  LOTENSIN Take 40 mg by mouth 2 (two) times daily.   budesonide 0.5 MG/2ML nebulizer solution Commonly known as:  PULMICORT Take 0.5 mg by nebulization 2 (two) times daily.   donepezil 10 MG tablet Commonly known as:  ARICEPT Take 10 mg by mouth at bedtime.   famotidine 20 MG tablet Commonly known as:  PEPCID Take 20 mg by mouth at bedtime.   feeding supplement (ENSURE ENLIVE) Liqd Take 237 mLs by mouth 2 (two) times daily between meals.   fluticasone 50 MCG/ACT nasal spray Commonly known as:  FLONASE Place 2 sprays into both nostrils daily.   furosemide 40 MG tablet Commonly known as:  LASIX Take 1.5 tablets (60 mg total) by mouth daily.   guaiFENesin 600 MG 12 hr tablet Commonly known as:  MUCINEX Take 1 tablet (600 mg total) by mouth 2 (two) times daily.   ICAPS MV Tabs Take 1 tablet by mouth daily.   ipratropium-albuterol 0.5-2.5 (3) MG/3ML Soln Commonly known as:  DUONEB  Take 3 mLs by nebulization every 4 (four) hours as needed.   loratadine 10 MG tablet Commonly known as:  CLARITIN Take 1 tablet (10 mg total) by mouth daily.   metoprolol tartrate 50 MG tablet Commonly known as:  LOPRESSOR Take 1 tablet (50 mg total) by mouth daily.   multivitamin capsule Take 1 capsule by mouth daily.   OXYGEN Pt uses o2 2lpm with exertion as needed- [employer]   predniSONE 10 MG tablet Commonly known as:  DELTASONE Take 4 tablets (40 mg) daily for 2 days, then, Take 3 tablets (30 mg) daily for 2 days, then, Take 2 tablets (20 mg) daily for 2 days, then, Take 1 tablets (10 mg) daily for 1 days, then stop   Probiotic Caps Take 1 capsule by  mouth daily.   SPIRIVA RESPIMAT IN Inhale 2 puffs into the lungs daily.      Follow-up Information    Merrilee Seashore, MD. Schedule an appointment as soon as possible for a visit in 1 week(s).   Specialty:  Internal Medicine Contact information: [address] [place] [address] [phone]          Allergies  Allergen Reactions  . Sulfa Antibiotics Other (See Comments)    unspecified reaction    Consultations:   None   Other Procedures/Studies: Dg Chest 2 View  Result Date: 04/18/2018 CLINICAL DATA:  85 year old with chest pain. Weakness and shortness of breath. EXAM: CHEST - 2 VIEW COMPARISON:  03/22/2018 FINDINGS: Chronic hyperinflation and findings are suggestive for emphysematous disease. Cardiomediastinal silhouette is stable. Again noted are densities in the right lower chest but concern for acute on chronic disease. No other evidence for focal new lung densities. No large pleural effusions. Atherosclerotic calcifications at the aortic arch. IMPRESSION: Densities in the right lower chest are concerning for acute on chronic disease. Cannot exclude infectious etiology at the right lung base. Stable hyperinflation and emphysematous disease. Electronically Signed   By: Markus Daft M.D.   On: 04/18/2018 09:43   Ct Angio Chest Pe W/cm &/or Wo Cm  Result Date: 04/18/2018 CLINICAL DATA:  Shortness of breath and cough EXAM: CT ANGIOGRAPHY CHEST WITH CONTRAST TECHNIQUE: Multidetector CT imaging of the chest was performed using the standard protocol during bolus administration of intravenous contrast. Multiplanar CT image reconstructions and MIPs were obtained to evaluate the vascular anatomy. CONTRAST:  92mL ISOVUE-370 IOPAMIDOL (ISOVUE-370) INJECTION 76% COMPARISON:  Chest radiograph April 18, 2018 FINDINGS: Cardiovascular: There is no demonstrable pulmonary embolus. The ascending thoracic aorta has a maximum transverse diameter of 4.2 x 4.2 cm. There is  no demonstrable aortic dissection. There are foci atherosclerotic calcification in the proximal visualized great vessels. There is aortic atherosclerosis with irregular plaque throughout the proximal to mid descending thoracic aorta. There are foci of coronary artery calcification. There is no pericardial effusion or pericardial thickening evident. There is left ventricular hypertrophy. Mediastinum/Nodes: No thyroid lesions are evident. There is no appreciable thoracic adenopathy. Several lymph nodes show evident calcification consistent with prior granulomatous disease. There is a small hiatal hernia. Lungs/Pleura: There is underlying bullous emphysematous change. There are scattered calcified granulomas. There is localized scarring with bronchiectatic change in the left upper lobe. There is atelectatic change in the lung bases. There is an area of scarring with mild bronchiectatic change in the left lower lobe. There is no frank edema or consolidation. There is no appreciable pleural effusion. There is epicardial fat prominence along the left and right heart borders. Upper Abdomen:  There are calcified splenic granulomas. There is upper abdominal aortic atherosclerosis. Visualized upper abdominal structures otherwise appear unremarkable. Musculoskeletal: There are no blastic or lytic bone lesions. There is no intramuscular lesion. There is degenerative change in each shoulder, somewhat more severe on the left than on the right. Review of the MIP images confirms the above findings. IMPRESSION: 1.  No demonstrable pulmonary embolus. 2. Prominence of the ascending thoracic aorta with a maximum transverse diameter 4.2 x 4.2 cm. No evident dissection. Extensive aortic atherosclerosis with irregular plaque throughout the proximal to mid descending thoracic aorta noted. Foci of coronary artery calcification and great vessel calcification noted. Recommend semi-annual imaging followup by CTA or MRA and referral to  cardiothoracic surgery if not already obtained. This recommendation follows 2010 ACCF/AHA/AATS/ACR/ASA/SCA/SCAI/SIR/STS/SVM Guidelines for the Diagnosis and Management of Patients With Thoracic Aortic Disease. Circulation. 2010; 121: D782-U23. Aortic aneurysm NOS (ICD10-I71.9). 3.  There is left ventricular hypertrophy. 4. Extensive emphysematous change. Areas of cicatrization with associated bronchiectatic change in the left upper and left lower lobe regions. Scattered areas of atelectasis. No frank edema consolidation evident. 5. Evidence of prior granulomatous disease with calcified parenchymal lung granulomas as well as several calcified lymph nodes and splenic calcified granulomas. 6.  No adenopathy by size criteria. 7.  Fairly small hiatal hernia. Aortic Atherosclerosis (ICD10-I70.0) and Emphysema (ICD10-J43.9). Electronically Signed   By: Lowella Grip III M.D.   On: 04/18/2018 10:41   Dg Chest Port 1 View  Result Date: 04/21/2018 CLINICAL DATA:  Shortness of breath, cough EXAM: PORTABLE CHEST 1 VIEW COMPARISON:  04/18/2018 FINDINGS: There is hyperinflation of the lungs compatible with COPD. Heart is normal size. Bibasilar scarring or atelectasis. Small left pleural effusion. No acute bony abnormality. IMPRESSION: COPD.  Bibasilar atelectasis or scarring and small left effusion. Electronically Signed   By: Rolm Baptise M.D.   On: 04/21/2018 03:07   Vas [place] Lower Extremity Venous (dvt) (only Mc & Wl 7a-7p)  Result Date: 04/18/2018  Lower Venous Study Indications: Edema, SOB, and cough.  Comparison Study: No prior study on file Performing Technologist: Sharion Dove RVS  Examination Guidelines: A complete evaluation includes B-mode imaging, spectral Doppler, color Doppler, and power Doppler as needed of all accessible portions of each vessel. Bilateral testing is considered an integral part of a complete examination. Limited examinations for reoccurring indications may be performed as noted.  Right  Venous Findings: +---------+---------------+---------+-----------+----------+-------+          CompressibilityPhasicitySpontaneityPropertiesSummary +---------+---------------+---------+-----------+----------+-------+ CFV      Full           Yes      Yes                          +---------+---------------+---------+-----------+----------+-------+ SFJ      Full                                                 +---------+---------------+---------+-----------+----------+-------+ FV Prox  Full                                                 +---------+---------------+---------+-----------+----------+-------+ FV Mid   Full                                                 +---------+---------------+---------+-----------+----------+-------+  FV DistalFull                                                 +---------+---------------+---------+-----------+----------+-------+ PFV      Full                                                 +---------+---------------+---------+-----------+----------+-------+ POP      Full           Yes      Yes                          +---------+---------------+---------+-----------+----------+-------+ PTV      Full                                                 +---------+---------------+---------+-----------+----------+-------+ PERO     Full                                                 +---------+---------------+---------+-----------+----------+-------+  Left Venous Findings: +---+---------------+---------+-----------+----------+-------+    CompressibilityPhasicitySpontaneityPropertiesSummary +---+---------------+---------+-----------+----------+-------+ CFVFull           Yes      Yes                          +---+---------------+---------+-----------+----------+-------+    Summary: Right: There is no evidence of deep vein thrombosis in the lower extremity.  *See table(s) above for measurements and observations.  Electronically signed by Ruta Hinds MD on 04/18/2018 at 5:22:06 PM.    Final       TODAY-DAY OF DISCHARGE:  Subjective:   Sundra Aland today has no headache,no chest abdominal pain,no new weakness tingling or numbness, feels much better wants to go home today.   Objective:   Blood pressure 132/83, pulse 96, temperature 97.6 F (36.4 C), temperature source Oral, resp. rate (!) 22, height 5\' 7"  (1.702 m), weight 69.8 kg, SpO2 95 %.  Intake/Output Summary (Last 24 hours) at 04/22/2018 0951 Last data filed at 04/22/2018 0800 Gross per 24 hour  Intake 940 ml  Output -  Net 940 ml   Filed Weights   04/18/18 0845 04/18/18 1547 04/19/18 0417  Weight: 74.8 kg 70.1 kg 69.8 kg    Exam: Awake Alert, Oriented *3, No new F.N deficits, Normal affect [place].AT,PERRAL Supple Neck,No JVD, No cervical lymphadenopathy appriciated.  Symmetrical Chest wall movement, Good air movement bilaterally, CTAB RRR,No Gallops,Rubs or new Murmurs, No Parasternal Heave +ve B.Sounds, Abd Soft, Non tender, No organomegaly appriciated, No rebound -guarding or rigidity. No Cyanosis, Clubbing or edema, No new Rash or bruise   PERTINENT RADIOLOGIC STUDIES: Dg Chest 2 View  Result Date: 04/18/2018 CLINICAL DATA:  85 year old with chest pain. Weakness and shortness of breath. EXAM: CHEST - 2 VIEW COMPARISON:  03/22/2018 FINDINGS: Chronic hyperinflation and findings are suggestive for emphysematous disease. Cardiomediastinal silhouette is stable. Again noted are densities in the right lower  chest but concern for acute on chronic disease. No other evidence for focal new lung densities. No large pleural effusions. Atherosclerotic calcifications at the aortic arch. IMPRESSION: Densities in the right lower chest are concerning for acute on chronic disease. Cannot exclude infectious etiology at the right lung base. Stable hyperinflation and emphysematous disease. Electronically Signed   By: Markus Daft M.D.   On: 04/18/2018  09:43   Ct Angio Chest Pe W/cm &/or Wo Cm  Result Date: 04/18/2018 CLINICAL DATA:  Shortness of breath and cough EXAM: CT ANGIOGRAPHY CHEST WITH CONTRAST TECHNIQUE: Multidetector CT imaging of the chest was performed using the standard protocol during bolus administration of intravenous contrast. Multiplanar CT image reconstructions and MIPs were obtained to evaluate the vascular anatomy. CONTRAST:  7mL ISOVUE-370 IOPAMIDOL (ISOVUE-370) INJECTION 76% COMPARISON:  Chest radiograph April 18, 2018 FINDINGS: Cardiovascular: There is no demonstrable pulmonary embolus. The ascending thoracic aorta has a maximum transverse diameter of 4.2 x 4.2 cm. There is no demonstrable aortic dissection. There are foci atherosclerotic calcification in the proximal visualized great vessels. There is aortic atherosclerosis with irregular plaque throughout the proximal to mid descending thoracic aorta. There are foci of coronary artery calcification. There is no pericardial effusion or pericardial thickening evident. There is left ventricular hypertrophy. Mediastinum/Nodes: No thyroid lesions are evident. There is no appreciable thoracic adenopathy. Several lymph nodes show evident calcification consistent with prior granulomatous disease. There is a small hiatal hernia. Lungs/Pleura: There is underlying bullous emphysematous change. There are scattered calcified granulomas. There is localized scarring with bronchiectatic change in the left upper lobe. There is atelectatic change in the lung bases. There is an area of scarring with mild bronchiectatic change in the left lower lobe. There is no frank edema or consolidation. There is no appreciable pleural effusion. There is epicardial fat prominence along the left and right heart borders. Upper Abdomen: There are calcified splenic granulomas. There is upper abdominal aortic atherosclerosis. Visualized upper abdominal structures otherwise appear unremarkable. Musculoskeletal: There  are no blastic or lytic bone lesions. There is no intramuscular lesion. There is degenerative change in each shoulder, somewhat more severe on the left than on the right. Review of the MIP images confirms the above findings. IMPRESSION: 1.  No demonstrable pulmonary embolus. 2. Prominence of the ascending thoracic aorta with a maximum transverse diameter 4.2 x 4.2 cm. No evident dissection. Extensive aortic atherosclerosis with irregular plaque throughout the proximal to mid descending thoracic aorta noted. Foci of coronary artery calcification and great vessel calcification noted. Recommend semi-annual imaging followup by CTA or MRA and referral to cardiothoracic surgery if not already obtained. This recommendation follows 2010 ACCF/AHA/AATS/ACR/ASA/SCA/SCAI/SIR/STS/SVM Guidelines for the Diagnosis and Management of Patients With Thoracic Aortic Disease. Circulation. 2010; 121: X833-A25. Aortic aneurysm NOS (ICD10-I71.9). 3.  There is left ventricular hypertrophy. 4. Extensive emphysematous change. Areas of cicatrization with associated bronchiectatic change in the left upper and left lower lobe regions. Scattered areas of atelectasis. No frank edema consolidation evident. 5. Evidence of prior granulomatous disease with calcified parenchymal lung granulomas as well as several calcified lymph nodes and splenic calcified granulomas. 6.  No adenopathy by size criteria. 7.  Fairly small hiatal hernia. Aortic Atherosclerosis (ICD10-I70.0) and Emphysema (ICD10-J43.9). Electronically Signed   By: Lowella Grip III M.D.   On: 04/18/2018 10:41   Dg Chest Port 1 View  Result Date: 04/21/2018 CLINICAL DATA:  Shortness of breath, cough EXAM: PORTABLE CHEST 1 VIEW COMPARISON:  04/18/2018 FINDINGS: There is hyperinflation of the lungs compatible  with COPD. Heart is normal size. Bibasilar scarring or atelectasis. Small left pleural effusion. No acute bony abnormality. IMPRESSION: COPD.  Bibasilar atelectasis or scarring  and small left effusion. Electronically Signed   By: Rolm Baptise M.D.   On: 04/21/2018 03:07   Vas [place] Lower Extremity Venous (dvt) (only Mc & Wl 7a-7p)  Result Date: 04/18/2018  Lower Venous Study Indications: Edema, SOB, and cough.  Comparison Study: No prior study on file Performing Technologist: Sharion Dove RVS  Examination Guidelines: A complete evaluation includes B-mode imaging, spectral Doppler, color Doppler, and power Doppler as needed of all accessible portions of each vessel. Bilateral testing is considered an integral part of a complete examination. Limited examinations for reoccurring indications may be performed as noted.  Right Venous Findings: +---------+---------------+---------+-----------+----------+-------+          CompressibilityPhasicitySpontaneityPropertiesSummary +---------+---------------+---------+-----------+----------+-------+ CFV      Full           Yes      Yes                          +---------+---------------+---------+-----------+----------+-------+ SFJ      Full                                                 +---------+---------------+---------+-----------+----------+-------+ FV Prox  Full                                                 +---------+---------------+---------+-----------+----------+-------+ FV Mid   Full                                                 +---------+---------------+---------+-----------+----------+-------+ FV DistalFull                                                 +---------+---------------+---------+-----------+----------+-------+ PFV      Full                                                 +---------+---------------+---------+-----------+----------+-------+ POP      Full           Yes      Yes                          +---------+---------------+---------+-----------+----------+-------+ PTV      Full                                                  +---------+---------------+---------+-----------+----------+-------+ PERO     Full                                                 +---------+---------------+---------+-----------+----------+-------+  Left Venous Findings: +---+---------------+---------+-----------+----------+-------+    CompressibilityPhasicitySpontaneityPropertiesSummary +---+---------------+---------+-----------+----------+-------+ CFVFull           Yes      Yes                          +---+---------------+---------+-----------+----------+-------+    Summary: Right: There is no evidence of deep vein thrombosis in the lower extremity.  *See table(s) above for measurements and observations. Electronically signed by Ruta Hinds MD on 04/18/2018 at 5:22:06 PM.    Final      PERTINENT LAB RESULTS: CBC: Recent Labs    04/21/18 0708  WBC 16.6*  HGB 13.5  HCT 41.2  PLT 321   CMET CMP     Component Value Date/Time   NA 141 04/22/2018 0436   K 4.0 04/22/2018 0436   CL 99 04/22/2018 0436   CO2 30 04/22/2018 0436   GLUCOSE 139 (H) 04/22/2018 0436   BUN 63 (H) 04/22/2018 0436   CREATININE 1.73 (H) 04/22/2018 0436   CALCIUM 9.5 04/22/2018 0436   PROT 6.7 06/30/2015 1157   ALBUMIN 2.9 (L) 06/30/2015 1157   AST 21 06/30/2015 1157   ALT 18 06/30/2015 1157   ALKPHOS 51 06/30/2015 1157   BILITOT 0.8 06/30/2015 1157   GFRNONAA 34 (L) 04/22/2018 0436   GFRAA 39 (L) 04/22/2018 0436    GFR Estimated Creatinine Clearance: 26 mL/min (A) (by C-G formula based on SCr of 1.73 mg/dL (H)). No results for input(s): LIPASE, AMYLASE in the last 72 hours. No results for input(s): CKTOTAL, CKMB, CKMBINDEX, TROPONINI in the last 72 hours. Invalid input(s): POCBNP No results for input(s): DDIMER in the last 72 hours. No results for input(s): HGBA1C in the last 72 hours. No results for input(s): CHOL, HDL, LDLCALC, TRIG, CHOLHDL, LDLDIRECT in the last 72 hours. No results for input(s): TSH, T4TOTAL, T3FREE, THYROIDAB  in the last 72 hours.  Invalid input(s): FREET3 No results for input(s): VITAMINB12, FOLATE, FERRITIN, TIBC, IRON, RETICCTPCT in the last 72 hours. Coags: No results for input(s): INR in the last 72 hours.  Invalid input(s): PT Microbiology: No results found for this or any previous visit (from the past 240 hour(s)).  FURTHER DISCHARGE INSTRUCTIONS:  Get Medicines reviewed and adjusted: Please take all your medications with you for your next visit with your Primary MD  Laboratory/radiological data: Please request your Primary MD to go over all hospital tests and procedure/radiological results at the follow up, please ask your Primary MD to get all Hospital records sent to his/her office.  In some cases, they will be blood work, cultures and biopsy results pending at the time of your discharge. Please request that your primary care M.D. goes through all the records of your hospital data and follows up on these results.  Also Note the following: If you experience worsening of your admission symptoms, develop shortness of breath, life threatening emergency, suicidal or homicidal thoughts you must seek medical attention immediately by calling 911 or calling your MD immediately  if symptoms less severe.  You must read complete instructions/literature along with all the possible adverse reactions/side effects for all the Medicines you take and that have been prescribed to you. Take any new Medicines after you have completely understood and accpet all the possible adverse reactions/side effects.   Do not drive when taking Pain medications or sleeping medications (Benzodaizepines)  Do not take more than prescribed Pain, Sleep and Anxiety Medications. It is not advisable to combine anxiety,sleep  and pain medications without talking with your primary care practitioner  Special Instructions: If you have smoked or chewed Tobacco  in the last 2 yrs please stop smoking, stop any regular Alcohol  and  or any Recreational drug use.  Wear Seat belts while driving.  Please note: You were cared for by a hospitalist during your hospital stay. Once you are discharged, your primary care physician will handle any further medical issues. Please note that NO REFILLS for any discharge medications will be authorized once you are discharged, as it is imperative that you return to your primary care physician (or establish a relationship with a primary care physician if you do not have one) for your post hospital discharge needs so that they can reassess your need for medications and monitor your lab values.  Total Time spent coordinating discharge including counseling, education and face to face time equals 35 minutes.  SignedOren Binet 04/22/2018 9:51 AM

## 2018-04-22 NOTE — Clinical Social Work Placement (Signed)
   CLINICAL SOCIAL WORK PLACEMENT  NOTE  Date:  04/22/2018  Patient Details  Name: Trevor Pitts MRN: [id number] Date of Birth: Jun 09, 1924  Clinical Social Work is seeking post-discharge placement for this patient at the [place] level of care (*CSW will initial, date and re-position this form in  chart as items are completed):  Yes   Patient/family provided with [facility] Work Department's list of facilities offering this level of care within the geographic area requested by the patient (or if unable, by the patient's family).  Yes   Patient/family informed of their freedom to choose among providers that offer the needed level of care, that participate in Medicare, Medicaid or managed care program needed by the patient, have an available bed and are willing to accept the patient.  Yes   Patient/family informed of [facility]'s ownership interest in [facility] and [facility], as well as of the fact that they are under no obligation to receive care at these facilities.  PASRR submitted to EDS on       PASRR number received on       Existing PASRR number confirmed on 04/22/18     FL2 transmitted to all facilities in geographic area requested by pt/family on 04/19/18     FL2 transmitted to all facilities within larger geographic area on       Patient informed that his/her managed care company has contracts with or will negotiate with certain facilities, including the following:        Yes   Patient/family informed of bed offers received.  Patient chooses bed at [place]     Physician recommends and patient chooses bed at      Patient to be transferred to [place] on 04/22/18.  Patient to be transferred to facility by [facility]     Patient family notified on 04/22/18 of transfer.  Name of family member notified:  Son     PHYSICIAN       Additional Comment:    _______________________________________________ Benard Halsted, LCSW 04/22/2018, 10:18 AM

## 2018-04-22 NOTE — Progress Notes (Signed)
Patient will DC to: [facility] Anticipated DC date:  04/22/2018 Family notified: Son and daughter in Systems developer by: Musician (daughter in Sports coach)  Please send signed script with patient on shadow chart.  Per MD patient ready for DC to [facility]. RN, patient, patient's family, and facility notified of DC. Discharge Summary and FL2 sent to facility. RN to call report prior to discharge ([phone] Room 706).   CSW will sign off for now as social work intervention is no longer needed. Please consult [place] again if new needs arise.  Cedric Fishman, LCSW Clinical Social Worker [phone]

## 2018-04-25 DIAGNOSIS — J189 Pneumonia, unspecified organism: Secondary | ICD-10-CM | POA: Diagnosis not present

## 2018-04-25 DIAGNOSIS — J9621 Acute and chronic respiratory failure with hypoxia: Secondary | ICD-10-CM | POA: Diagnosis not present

## 2018-04-25 DIAGNOSIS — R262 Difficulty in walking, not elsewhere classified: Secondary | ICD-10-CM | POA: Diagnosis not present

## 2018-04-25 DIAGNOSIS — F0391 Unspecified dementia with behavioral disturbance: Secondary | ICD-10-CM | POA: Diagnosis not present

## 2018-04-30 DIAGNOSIS — F0390 Unspecified dementia without behavioral disturbance: Secondary | ICD-10-CM | POA: Diagnosis not present

## 2018-04-30 DIAGNOSIS — J9621 Acute and chronic respiratory failure with hypoxia: Secondary | ICD-10-CM | POA: Diagnosis not present

## 2018-04-30 DIAGNOSIS — J690 Pneumonitis due to inhalation of food and vomit: Secondary | ICD-10-CM | POA: Diagnosis not present

## 2018-04-30 DIAGNOSIS — R197 Diarrhea, unspecified: Secondary | ICD-10-CM | POA: Diagnosis present

## 2018-04-30 DIAGNOSIS — D649 Anemia, unspecified: Secondary | ICD-10-CM | POA: Diagnosis present

## 2018-04-30 DIAGNOSIS — J9600 Acute respiratory failure, unspecified whether with hypoxia or hypercapnia: Secondary | ICD-10-CM | POA: Diagnosis not present

## 2018-04-30 DIAGNOSIS — R0689 Other abnormalities of breathing: Secondary | ICD-10-CM | POA: Diagnosis not present

## 2018-04-30 DIAGNOSIS — J449 Chronic obstructive pulmonary disease, unspecified: Secondary | ICD-10-CM | POA: Diagnosis not present

## 2018-04-30 DIAGNOSIS — Z7982 Long term (current) use of aspirin: Secondary | ICD-10-CM | POA: Diagnosis not present

## 2018-04-30 DIAGNOSIS — Z79899 Other long term (current) drug therapy: Secondary | ICD-10-CM | POA: Diagnosis not present

## 2018-04-30 DIAGNOSIS — K5790 Diverticulosis of intestine, part unspecified, without perforation or abscess without bleeding: Secondary | ICD-10-CM | POA: Diagnosis not present

## 2018-04-30 DIAGNOSIS — I129 Hypertensive chronic kidney disease with stage 1 through stage 4 chronic kidney disease, or unspecified chronic kidney disease: Secondary | ICD-10-CM | POA: Diagnosis not present

## 2018-04-30 DIAGNOSIS — R0602 Shortness of breath: Secondary | ICD-10-CM | POA: Diagnosis not present

## 2018-04-30 DIAGNOSIS — Z743 Need for continuous supervision: Secondary | ICD-10-CM | POA: Diagnosis not present

## 2018-04-30 DIAGNOSIS — N184 Chronic kidney disease, stage 4 (severe): Secondary | ICD-10-CM | POA: Diagnosis not present

## 2018-04-30 DIAGNOSIS — J189 Pneumonia, unspecified organism: Secondary | ICD-10-CM | POA: Diagnosis present

## 2018-04-30 DIAGNOSIS — I10 Essential (primary) hypertension: Secondary | ICD-10-CM | POA: Diagnosis present

## 2018-04-30 DIAGNOSIS — N179 Acute kidney failure, unspecified: Secondary | ICD-10-CM | POA: Diagnosis not present

## 2018-04-30 DIAGNOSIS — R279 Unspecified lack of coordination: Secondary | ICD-10-CM | POA: Diagnosis not present

## 2018-04-30 DIAGNOSIS — Z9981 Dependence on supplemental oxygen: Secondary | ICD-10-CM | POA: Diagnosis not present

## 2018-04-30 DIAGNOSIS — N170 Acute kidney failure with tubular necrosis: Secondary | ICD-10-CM | POA: Diagnosis not present

## 2018-04-30 DIAGNOSIS — E785 Hyperlipidemia, unspecified: Secondary | ICD-10-CM | POA: Diagnosis present

## 2018-04-30 DIAGNOSIS — F419 Anxiety disorder, unspecified: Secondary | ICD-10-CM | POA: Diagnosis present

## 2018-04-30 DIAGNOSIS — Z515 Encounter for palliative care: Secondary | ICD-10-CM | POA: Diagnosis present

## 2018-04-30 DIAGNOSIS — J441 Chronic obstructive pulmonary disease with (acute) exacerbation: Secondary | ICD-10-CM | POA: Diagnosis present

## 2018-04-30 DIAGNOSIS — R531 Weakness: Secondary | ICD-10-CM | POA: Diagnosis not present

## 2018-04-30 DIAGNOSIS — I2510 Atherosclerotic heart disease of native coronary artery without angina pectoris: Secondary | ICD-10-CM | POA: Diagnosis present

## 2018-04-30 DIAGNOSIS — J9601 Acute respiratory failure with hypoxia: Secondary | ICD-10-CM | POA: Diagnosis not present

## 2018-04-30 DIAGNOSIS — R6521 Severe sepsis with septic shock: Secondary | ICD-10-CM | POA: Diagnosis present

## 2018-04-30 DIAGNOSIS — Z66 Do not resuscitate: Secondary | ICD-10-CM | POA: Diagnosis present

## 2018-04-30 DIAGNOSIS — R0902 Hypoxemia: Secondary | ICD-10-CM | POA: Diagnosis not present

## 2018-04-30 DIAGNOSIS — Z882 Allergy status to sulfonamides status: Secondary | ICD-10-CM | POA: Diagnosis not present

## 2018-04-30 DIAGNOSIS — J440 Chronic obstructive pulmonary disease with acute lower respiratory infection: Secondary | ICD-10-CM | POA: Diagnosis present

## 2018-04-30 DIAGNOSIS — Z87891 Personal history of nicotine dependence: Secondary | ICD-10-CM | POA: Diagnosis not present

## 2018-04-30 DIAGNOSIS — Z8701 Personal history of pneumonia (recurrent): Secondary | ICD-10-CM | POA: Diagnosis not present

## 2018-04-30 DIAGNOSIS — A419 Sepsis, unspecified organism: Secondary | ICD-10-CM | POA: Diagnosis present

## 2018-04-30 DIAGNOSIS — R05 Cough: Secondary | ICD-10-CM | POA: Diagnosis not present

## 2018-05-08 DEATH — deceased

## 2020-06-15 IMAGING — CT CT ANGIO CHEST
2 of 6 series · 17 of 36 positions shown · IV contrast (iopamidol)
Comparison: Chest radiograph April 18, 2018

CLINICAL DATA: Shortness of breath and cough

EXAM:
CT ANGIOGRAPHY CHEST WITH CONTRAST
TECHNIQUE: Multidetector CT imaging of the chest was performed using the
standard protocol during bolus administration of intravenous
contrast. Multiplanar CT image reconstructions and MIPs were
obtained to evaluate the vascular anatomy.
CONTRAST:  80mL YJ4XB5-VHH IOPAMIDOL ([id number]) INJECTION 76%

[Series 8: pe thins · axial · 0.70mm/px · z∈[+1132,+1462]mm · 16 of 526 slices shown]
[im 27/526  lung]
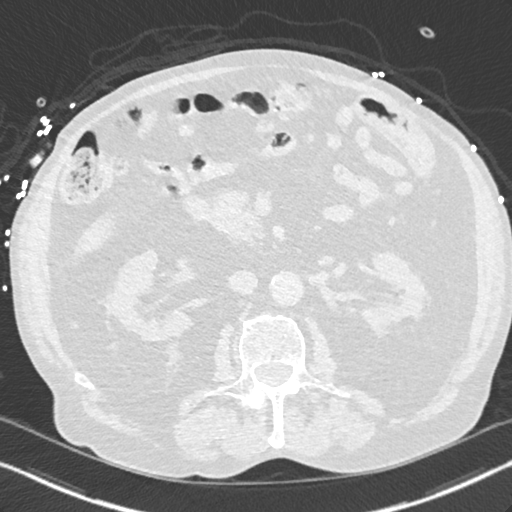
[im 53/526  mediastinal]
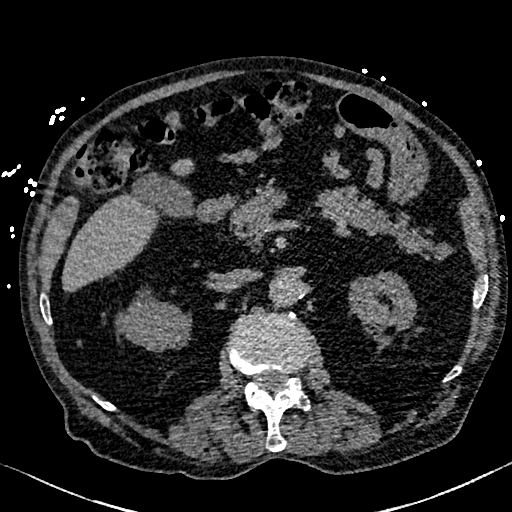
[im 79/526  lung]
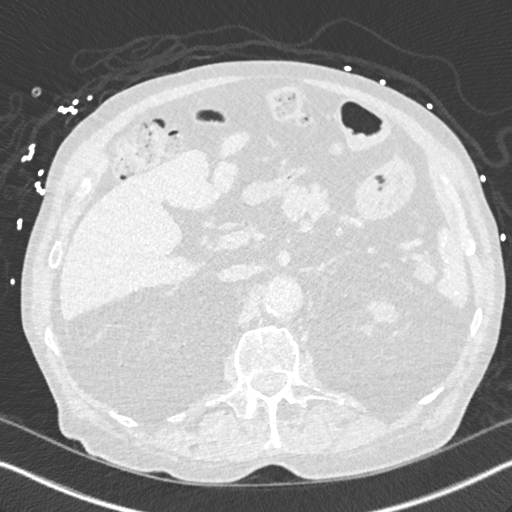
[im 132/526  mediastinal]
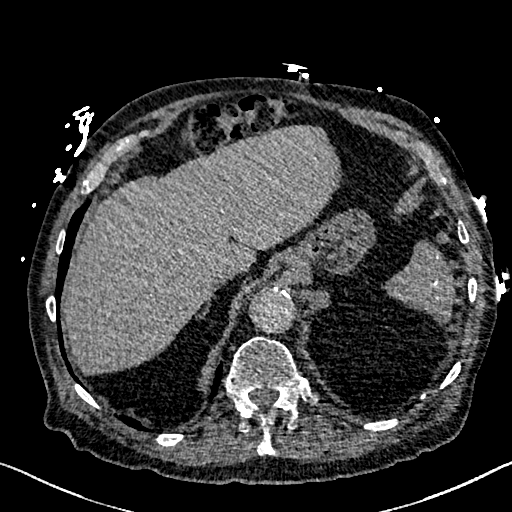
[im 158/526  lung]
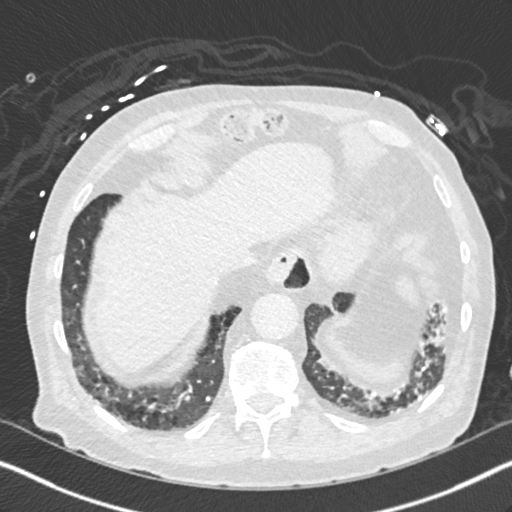
[im 184/526  mediastinal]
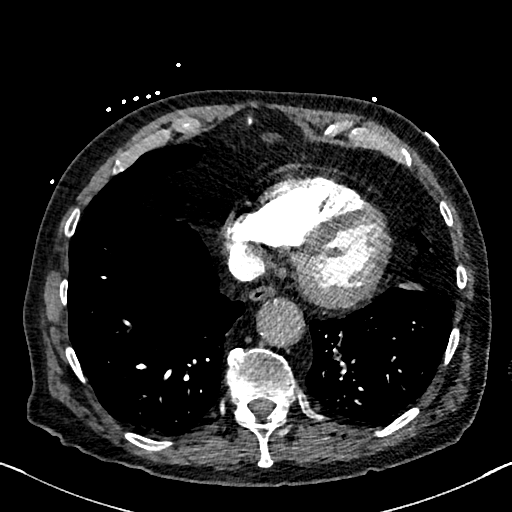
[im 211/526  lung]
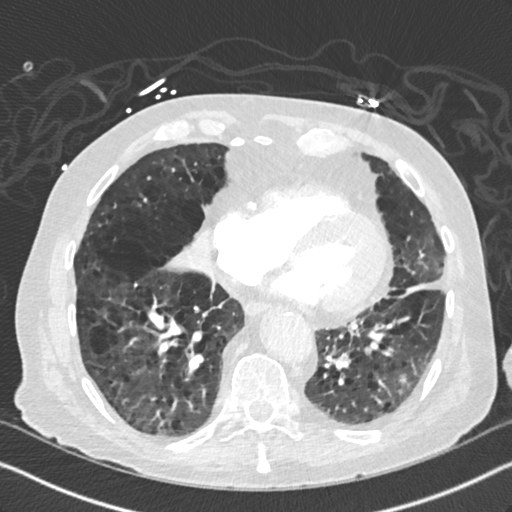
[im 237/526  mediastinal]
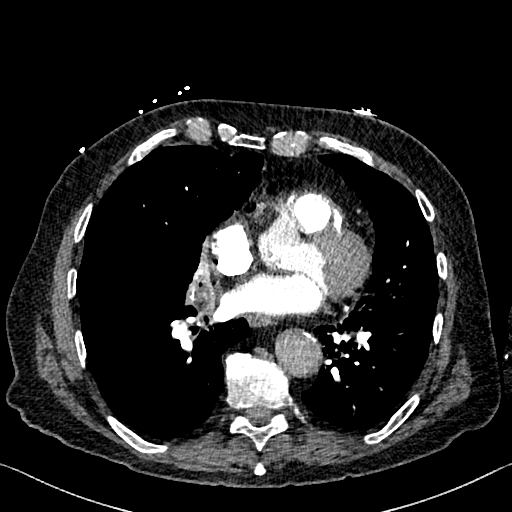
[im 289/526  lung]
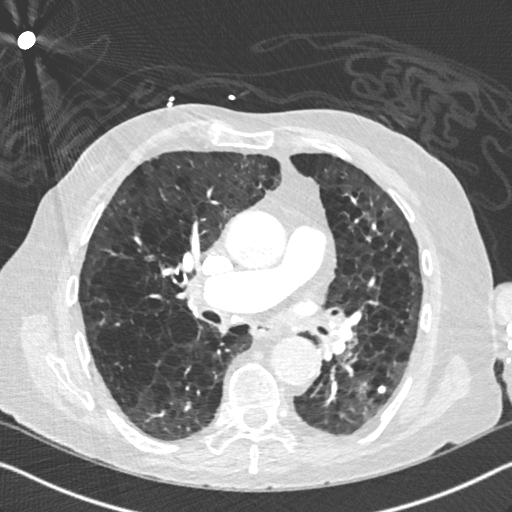
[im 316/526  mediastinal]
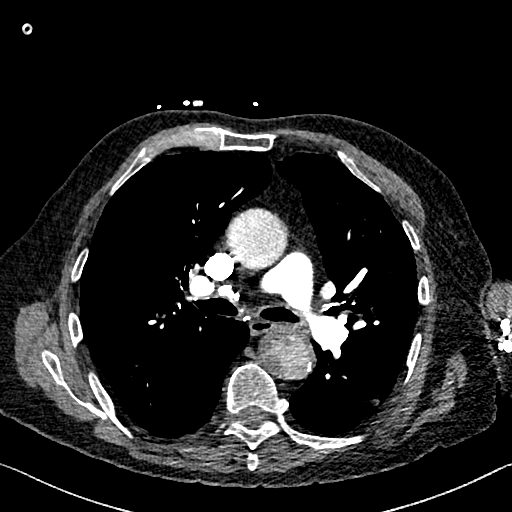
[im 342/526  lung]
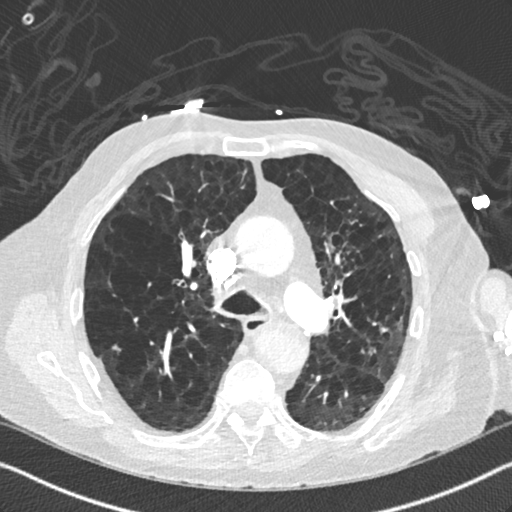
[im 368/526  mediastinal]
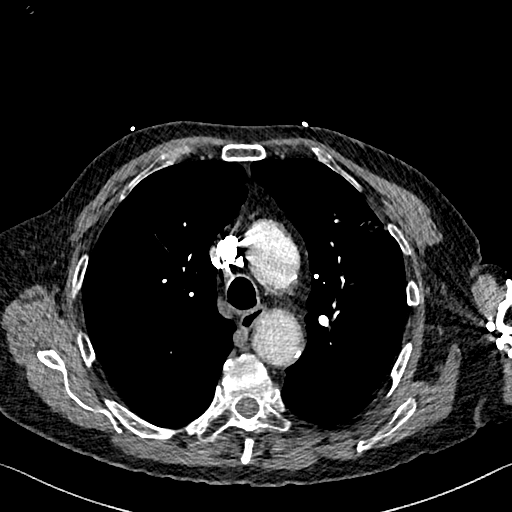
[im 394/526  lung]
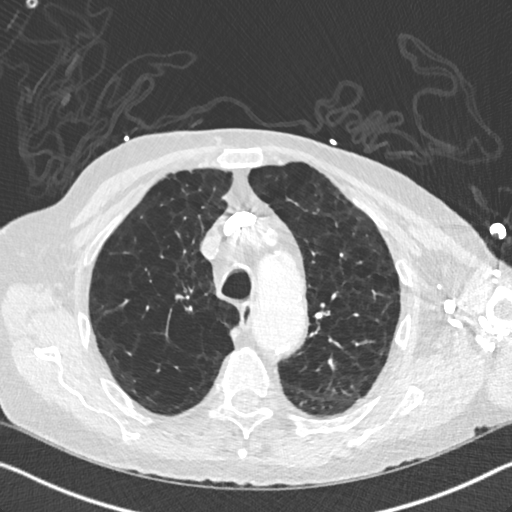
[im 447/526  mediastinal]
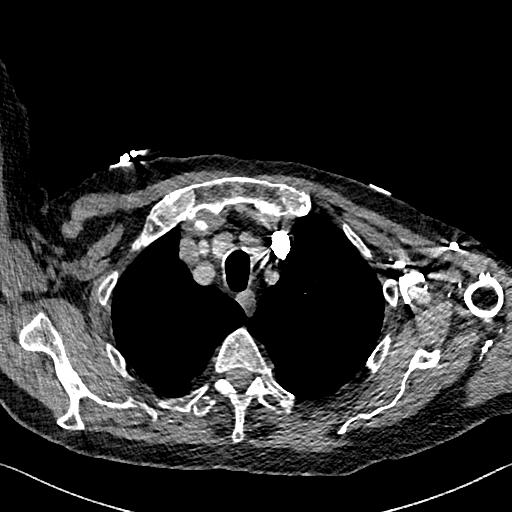
[im 473/526  lung]
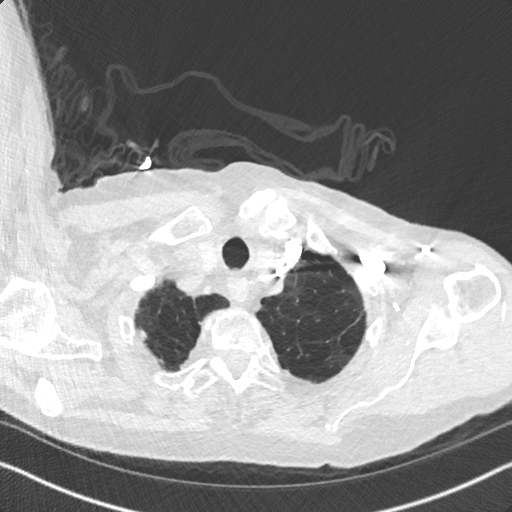
[im 499/526  mediastinal]
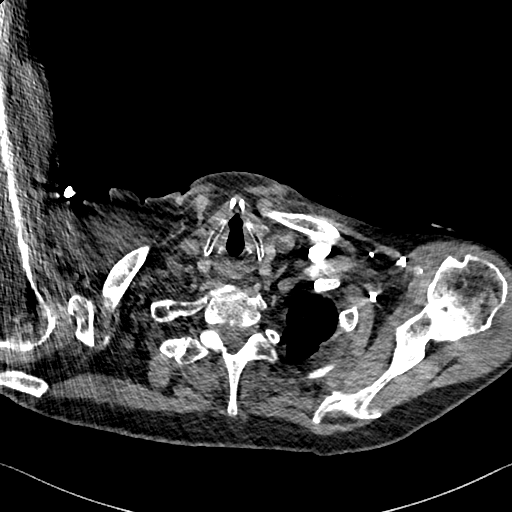

[Series 10: pe 2mm cor · coronal · 0.72mm/px · 1 of 135 slices shown]
[im 68/135  mediastinal]
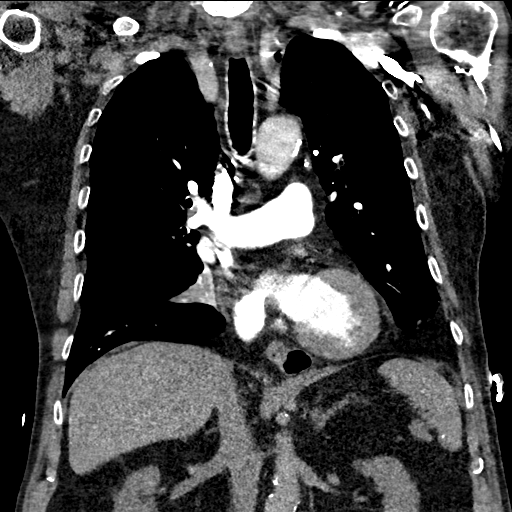

[17 of 36 positions shown; findings below may reference images not displayed]

FINDINGS: Cardiovascular: There is no demonstrable pulmonary embolus. The
ascending thoracic aorta has a maximum transverse diameter of 4.2 x
4.2 cm. There is no demonstrable aortic dissection. There are foci
atherosclerotic calcification in the proximal visualized great
vessels. There is aortic atherosclerosis with irregular plaque
throughout the proximal to mid descending thoracic aorta. There are
foci of coronary artery calcification. There is no pericardial
effusion or pericardial thickening evident. There is left
ventricular hypertrophy.

Mediastinum/Nodes: No thyroid lesions are evident. There is no
appreciable thoracic adenopathy. Several lymph nodes show evident
calcification consistent with prior granulomatous disease. There is
a small hiatal hernia.

Lungs/Pleura: There is underlying bullous emphysematous change.
There are scattered calcified granulomas. There is localized
scarring with bronchiectatic change in the left upper lobe. There is
atelectatic change in the lung bases. There is an area of scarring
with mild bronchiectatic change in the left lower lobe. There is no
frank edema or consolidation. There is no appreciable pleural
effusion. There is epicardial fat prominence along the left and
right heart borders.

Upper Abdomen: There are calcified splenic granulomas. There is
upper abdominal aortic atherosclerosis. Visualized upper abdominal
structures otherwise appear unremarkable.

Musculoskeletal: There are no blastic or lytic bone lesions. There
is no intramuscular lesion. There is degenerative change in each
shoulder, somewhat more severe on the left than on the right.

Review of the MIP images confirms the above findings.
IMPRESSION: 1.  No demonstrable pulmonary embolus.

2. Prominence of the ascending thoracic aorta with a maximum
transverse diameter 4.2 x 4.2 cm. No evident dissection. Extensive
aortic atherosclerosis with irregular plaque throughout the proximal
to mid descending thoracic aorta noted. Foci of coronary artery
calcification and great vessel calcification noted. Recommend
semi-annual imaging followup by CTA or MRA and referral to
cardiothoracic surgery if not already obtained. This recommendation
follows 0393 ACCF/AHA/AATS/ACR/ASA/SCA/YOSY/ADELIYI/E DIS/ROONEY Guidelines
for the Diagnosis and Management of Patients With Thoracic Aortic
Disease. Circulation. 0393; 121: E266-e36. Aortic aneurysm NOS
(D3M1B-XKI.3).

3.  There is left ventricular hypertrophy.

4. Extensive emphysematous change. Areas of cicatrization with
associated bronchiectatic change in the left upper and left lower
lobe regions. Scattered areas of atelectasis. No frank edema
consolidation evident.

5. Evidence of prior granulomatous disease with calcified
parenchymal lung granulomas as well as several calcified lymph nodes
and splenic calcified granulomas.

6.  No adenopathy by size criteria.

7.  Fairly small hiatal hernia.

Aortic Atherosclerosis (D3M1B-TK1.1) and Emphysema (D3M1B-N6W.O).

## 2023-10-13 NOTE — Telephone Encounter (Signed)
 See OV  note
